# Patient Record
Sex: Female | Born: 1960
Health system: Southern US, Community
[De-identification: ages and names within clinical notes are randomized; demographics above are authoritative.]

## PROBLEM LIST (undated history)

## (undated) DIAGNOSIS — K219 Gastro-esophageal reflux disease without esophagitis: Secondary | ICD-10-CM

## (undated) DIAGNOSIS — J189 Pneumonia, unspecified organism: Secondary | ICD-10-CM

## (undated) DIAGNOSIS — M1990 Unspecified osteoarthritis, unspecified site: Secondary | ICD-10-CM

## (undated) DIAGNOSIS — Z9889 Other specified postprocedural states: Secondary | ICD-10-CM

## (undated) DIAGNOSIS — E039 Hypothyroidism, unspecified: Secondary | ICD-10-CM

## (undated) DIAGNOSIS — F32A Depression, unspecified: Secondary | ICD-10-CM

## (undated) DIAGNOSIS — R112 Nausea with vomiting, unspecified: Secondary | ICD-10-CM

## (undated) DIAGNOSIS — F419 Anxiety disorder, unspecified: Secondary | ICD-10-CM

## (undated) DIAGNOSIS — M722 Plantar fascial fibromatosis: Secondary | ICD-10-CM

## (undated) DIAGNOSIS — R51 Headache: Secondary | ICD-10-CM

## (undated) DIAGNOSIS — Z860100 Personal history of colon polyps, unspecified: Secondary | ICD-10-CM

## (undated) DIAGNOSIS — J302 Other seasonal allergic rhinitis: Secondary | ICD-10-CM

## (undated) DIAGNOSIS — R002 Palpitations: Secondary | ICD-10-CM

## (undated) DIAGNOSIS — J45909 Unspecified asthma, uncomplicated: Secondary | ICD-10-CM

## (undated) DIAGNOSIS — E0590 Thyrotoxicosis, unspecified without thyrotoxic crisis or storm: Secondary | ICD-10-CM

## (undated) HISTORY — PX: CHOLECYSTECTOMY: SHX55

## (undated) HISTORY — PX: ABDOMINAL HYSTERECTOMY: SHX81

## (undated) HISTORY — PX: TONSILLECTOMY: SUR1361

## (undated) HISTORY — PX: PLANTAR FASCIECTOMY: SUR600

## (undated) HISTORY — PX: NASAL SEPTUM SURGERY: SHX37

## (undated) HISTORY — PX: MEDIAL PARTIAL KNEE REPLACEMENT: SHX5965

---

## 2000-07-08 ENCOUNTER — Ambulatory Visit (HOSPITAL_COMMUNITY): Admission: RE | Admit: 2000-07-08 | Discharge: 2000-07-08 | Payer: Self-pay | Admitting: Internal Medicine

## 2000-07-19 ENCOUNTER — Encounter: Payer: Self-pay | Admitting: Obstetrics and Gynecology

## 2000-07-19 ENCOUNTER — Ambulatory Visit (HOSPITAL_COMMUNITY): Admission: RE | Admit: 2000-07-19 | Discharge: 2000-07-19 | Payer: Self-pay | Admitting: Obstetrics and Gynecology

## 2001-01-10 ENCOUNTER — Encounter: Payer: Self-pay | Admitting: Obstetrics and Gynecology

## 2001-01-10 ENCOUNTER — Ambulatory Visit (HOSPITAL_COMMUNITY): Admission: RE | Admit: 2001-01-10 | Discharge: 2001-01-10 | Payer: Self-pay | Admitting: Obstetrics and Gynecology

## 2001-12-01 ENCOUNTER — Other Ambulatory Visit: Admission: RE | Admit: 2001-12-01 | Discharge: 2001-12-01 | Payer: Self-pay | Admitting: Dermatology

## 2002-01-23 ENCOUNTER — Encounter: Payer: Self-pay | Admitting: Obstetrics and Gynecology

## 2002-01-23 ENCOUNTER — Ambulatory Visit (HOSPITAL_COMMUNITY): Admission: RE | Admit: 2002-01-23 | Discharge: 2002-01-23 | Payer: Self-pay | Admitting: Obstetrics and Gynecology

## 2003-01-25 ENCOUNTER — Encounter: Admission: RE | Admit: 2003-01-25 | Discharge: 2003-01-25 | Payer: Self-pay | Admitting: Obstetrics and Gynecology

## 2003-01-25 ENCOUNTER — Ambulatory Visit (HOSPITAL_COMMUNITY): Admission: RE | Admit: 2003-01-25 | Discharge: 2003-01-25 | Payer: Self-pay | Admitting: Obstetrics and Gynecology

## 2003-02-08 ENCOUNTER — Ambulatory Visit (HOSPITAL_COMMUNITY): Admission: RE | Admit: 2003-02-08 | Discharge: 2003-02-08 | Payer: Self-pay | Admitting: Family Medicine

## 2003-02-08 ENCOUNTER — Ambulatory Visit (HOSPITAL_COMMUNITY): Admission: RE | Admit: 2003-02-08 | Discharge: 2003-02-08 | Payer: Self-pay | Admitting: Obstetrics and Gynecology

## 2003-04-09 ENCOUNTER — Encounter: Admission: RE | Admit: 2003-04-09 | Discharge: 2003-04-09 | Payer: Self-pay | Admitting: Obstetrics & Gynecology

## 2003-04-26 ENCOUNTER — Ambulatory Visit (HOSPITAL_COMMUNITY): Admission: RE | Admit: 2003-04-26 | Discharge: 2003-04-26 | Payer: Self-pay | Admitting: Family Medicine

## 2003-08-20 ENCOUNTER — Ambulatory Visit (HOSPITAL_COMMUNITY): Admission: RE | Admit: 2003-08-20 | Discharge: 2003-08-20 | Payer: Self-pay | Admitting: Otolaryngology

## 2004-01-26 ENCOUNTER — Ambulatory Visit (HOSPITAL_COMMUNITY): Admission: RE | Admit: 2004-01-26 | Discharge: 2004-01-26 | Payer: Self-pay | Admitting: Obstetrics and Gynecology

## 2005-01-26 ENCOUNTER — Ambulatory Visit (HOSPITAL_COMMUNITY): Admission: RE | Admit: 2005-01-26 | Discharge: 2005-01-26 | Payer: Self-pay | Admitting: Obstetrics and Gynecology

## 2005-03-07 ENCOUNTER — Ambulatory Visit (HOSPITAL_COMMUNITY): Admission: RE | Admit: 2005-03-07 | Discharge: 2005-03-07 | Payer: Self-pay | Admitting: Family Medicine

## 2005-03-08 ENCOUNTER — Ambulatory Visit (HOSPITAL_COMMUNITY): Admission: RE | Admit: 2005-03-08 | Discharge: 2005-03-08 | Payer: Self-pay | Admitting: Family Medicine

## 2005-07-30 ENCOUNTER — Ambulatory Visit (HOSPITAL_COMMUNITY): Admission: RE | Admit: 2005-07-30 | Discharge: 2005-07-30 | Payer: Self-pay | Admitting: Internal Medicine

## 2005-07-30 ENCOUNTER — Ambulatory Visit: Payer: Self-pay | Admitting: Internal Medicine

## 2005-07-30 ENCOUNTER — Encounter (INDEPENDENT_AMBULATORY_CARE_PROVIDER_SITE_OTHER): Payer: Self-pay | Admitting: Specialist

## 2005-09-17 ENCOUNTER — Ambulatory Visit (HOSPITAL_COMMUNITY): Admission: RE | Admit: 2005-09-17 | Discharge: 2005-09-17 | Payer: Self-pay | Admitting: Family Medicine

## 2006-01-28 ENCOUNTER — Ambulatory Visit (HOSPITAL_COMMUNITY): Admission: RE | Admit: 2006-01-28 | Discharge: 2006-01-28 | Payer: Self-pay | Admitting: Family Medicine

## 2006-11-15 ENCOUNTER — Ambulatory Visit (HOSPITAL_COMMUNITY): Admission: RE | Admit: 2006-11-15 | Discharge: 2006-11-15 | Payer: Self-pay | Admitting: Obstetrics and Gynecology

## 2007-09-12 ENCOUNTER — Ambulatory Visit (HOSPITAL_COMMUNITY): Admission: RE | Admit: 2007-09-12 | Discharge: 2007-09-12 | Payer: Self-pay | Admitting: Family Medicine

## 2007-11-07 ENCOUNTER — Ambulatory Visit (HOSPITAL_COMMUNITY): Admission: RE | Admit: 2007-11-07 | Discharge: 2007-11-07 | Payer: Self-pay | Admitting: Obstetrics and Gynecology

## 2008-11-05 ENCOUNTER — Ambulatory Visit (HOSPITAL_COMMUNITY): Admission: RE | Admit: 2008-11-05 | Discharge: 2008-11-05 | Payer: Self-pay | Admitting: Family Medicine

## 2008-11-12 ENCOUNTER — Ambulatory Visit (HOSPITAL_COMMUNITY): Admission: RE | Admit: 2008-11-12 | Discharge: 2008-11-12 | Payer: Self-pay | Admitting: Family Medicine

## 2009-10-14 ENCOUNTER — Encounter: Admission: RE | Admit: 2009-10-14 | Discharge: 2009-10-14 | Payer: Self-pay | Admitting: Obstetrics & Gynecology

## 2009-11-18 ENCOUNTER — Ambulatory Visit (HOSPITAL_COMMUNITY): Admission: RE | Admit: 2009-11-18 | Discharge: 2009-11-18 | Payer: Self-pay | Admitting: Obstetrics and Gynecology

## 2010-02-19 ENCOUNTER — Encounter: Payer: Self-pay | Admitting: Family Medicine

## 2010-02-28 ENCOUNTER — Other Ambulatory Visit (HOSPITAL_COMMUNITY): Payer: Self-pay | Admitting: Endocrinology

## 2010-02-28 DIAGNOSIS — E049 Nontoxic goiter, unspecified: Secondary | ICD-10-CM

## 2010-05-05 ENCOUNTER — Ambulatory Visit (HOSPITAL_COMMUNITY): Payer: BC Managed Care – PPO

## 2010-05-05 ENCOUNTER — Other Ambulatory Visit (HOSPITAL_COMMUNITY): Payer: Self-pay

## 2010-05-12 ENCOUNTER — Ambulatory Visit (HOSPITAL_COMMUNITY)
Admission: RE | Admit: 2010-05-12 | Discharge: 2010-05-12 | Disposition: A | Discharge status: Home / Self Care | Payer: BC Managed Care – PPO | Source: Ambulatory Visit | Attending: Endocrinology | Admitting: Endocrinology

## 2010-05-12 DIAGNOSIS — E049 Nontoxic goiter, unspecified: Secondary | ICD-10-CM | POA: Insufficient documentation

## 2010-06-16 NOTE — Op Note (Signed)
NAMEARNIE, Herrera               ACCOUNT NO.:  [id number]   MEDICAL RECORD NO.:  [id number]          PATIENT TYPE:  AMB   LOCATION:  DAY                           FACILITY:  [facility]   PHYSICIAN:  Lionel December, M.D.    DATE OF BIRTH:  [birth year]   DATE OF PROCEDURE:  07/30/2005  DATE OF DISCHARGE:                                 OPERATIVE REPORT   PROCEDURE:  Colonoscopy.   INDICATIONS:  Joyce Herrera is a 50 year old Caucasian female who is here for high-  risk screening colonoscopy.  Family history is positive for colon carcinoma  in her father.  Joyce Herrera's last colonoscopy was 5 years ago.  Procedure and  risks were reviewed with the patient and informed consent was obtained.   MEDICATIONS FOR CONSCIOUS SEDATION:  Fentanyl 50 mcg IV, Versed 8 mg IV.   FINDINGS:  Procedure performed in endoscopy suite.  The patient's vital  signs and O2 sat were monitored during procedure and remained stable.  The  patient was placed in left lateral recumbent position and rectal examination  performed.  No abnormality noted on external or digital exam.  Olympus  videoscope was placed in the rectum and advanced under vision into sigmoid  colon and beyond.  Preparation was satisfactory.  A few small diverticula  were noted at sigmoid colon.  Scope was passed in cecum which was identified  by ileocecal valve and appendiceal orifice.  Pictures taken for the record.  There was a small polyp involving the blind-end cecum which was ablated via  cold biopsy.  Mucosa was carefully examined on the way out and there was  another polyp at proximal sigmoid colon which was ablated via cold biopsy.  Mucosa of the rest of the colon was normal.  Rectal mucosa similarly was  normal.  Scope was retroflexed to examine anorectal junction and small  hemorrhoids noted below the dentate line.  Endoscope was straightened and  withdrawn.  The patient tolerated the procedure well.   FINAL DIAGNOSES:  1.  Two small polyps ablated via  cold biopsy, one from cecum, other one from      sigmoid colon.  2.  Few scattered diverticula at sigmoid colon.  3.  External hemorrhoids.   RECOMMENDATIONS:  High-fiber diet.  I will be contacting the patient with  results of biopsy and further recommendations.      Lionel December, M.D.  Electronically Signed     NR/MEDQ  D:  07/30/2005  T:  07/30/2005  Job:  [id number]   cc:   Patrica Duel, M.D.  Fax: [phone]

## 2010-07-11 ENCOUNTER — Emergency Department (HOSPITAL_COMMUNITY): Payer: BC Managed Care – PPO

## 2010-07-11 ENCOUNTER — Emergency Department (HOSPITAL_COMMUNITY)
Admission: EM | Admit: 2010-07-11 | Discharge: 2010-07-11 | Disposition: A | Discharge status: Home / Self Care | Payer: BC Managed Care – PPO | Attending: Emergency Medicine | Admitting: Emergency Medicine

## 2010-07-11 DIAGNOSIS — Y9229 Other specified public building as the place of occurrence of the external cause: Secondary | ICD-10-CM | POA: Insufficient documentation

## 2010-07-11 DIAGNOSIS — M25539 Pain in unspecified wrist: Secondary | ICD-10-CM | POA: Insufficient documentation

## 2010-07-11 DIAGNOSIS — S63509A Unspecified sprain of unspecified wrist, initial encounter: Secondary | ICD-10-CM | POA: Insufficient documentation

## 2010-07-11 DIAGNOSIS — W010XXA Fall on same level from slipping, tripping and stumbling without subsequent striking against object, initial encounter: Secondary | ICD-10-CM | POA: Insufficient documentation

## 2010-07-11 DIAGNOSIS — S7000XA Contusion of unspecified hip, initial encounter: Secondary | ICD-10-CM | POA: Insufficient documentation

## 2010-07-11 DIAGNOSIS — M25559 Pain in unspecified hip: Secondary | ICD-10-CM | POA: Insufficient documentation

## 2010-08-18 ENCOUNTER — Other Ambulatory Visit (HOSPITAL_COMMUNITY): Payer: Self-pay | Admitting: Family Medicine

## 2010-08-18 DIAGNOSIS — Z139 Encounter for screening, unspecified: Secondary | ICD-10-CM

## 2010-08-18 DIAGNOSIS — G589 Mononeuropathy, unspecified: Secondary | ICD-10-CM

## 2010-08-25 ENCOUNTER — Ambulatory Visit (HOSPITAL_COMMUNITY)
Admission: RE | Admit: 2010-08-25 | Discharge: 2010-08-25 | Disposition: A | Discharge status: Home / Self Care | Payer: BC Managed Care – PPO | Source: Ambulatory Visit | Attending: Family Medicine | Admitting: Family Medicine

## 2010-08-25 DIAGNOSIS — Z780 Asymptomatic menopausal state: Secondary | ICD-10-CM | POA: Insufficient documentation

## 2010-08-25 DIAGNOSIS — Z13820 Encounter for screening for osteoporosis: Secondary | ICD-10-CM | POA: Insufficient documentation

## 2010-08-25 DIAGNOSIS — G589 Mononeuropathy, unspecified: Secondary | ICD-10-CM

## 2010-08-25 DIAGNOSIS — Z139 Encounter for screening, unspecified: Secondary | ICD-10-CM

## 2010-09-14 ENCOUNTER — Encounter (INDEPENDENT_AMBULATORY_CARE_PROVIDER_SITE_OTHER): Payer: BC Managed Care – PPO | Admitting: Internal Medicine

## 2010-09-14 ENCOUNTER — Ambulatory Visit (HOSPITAL_COMMUNITY)
Admission: RE | Admit: 2010-09-14 | Discharge: 2010-09-14 | Disposition: A | Discharge status: Home / Self Care | Payer: BC Managed Care – PPO | Source: Ambulatory Visit | Attending: Internal Medicine | Admitting: Internal Medicine

## 2010-09-14 ENCOUNTER — Encounter (HOSPITAL_COMMUNITY): Payer: Self-pay | Admitting: *Deleted

## 2010-09-14 ENCOUNTER — Encounter (HOSPITAL_COMMUNITY)
Admission: RE | Disposition: A | Discharge status: Home / Self Care | Payer: Self-pay | Source: Ambulatory Visit | Attending: Internal Medicine

## 2010-09-14 DIAGNOSIS — Z7982 Long term (current) use of aspirin: Secondary | ICD-10-CM | POA: Insufficient documentation

## 2010-09-14 DIAGNOSIS — K5730 Diverticulosis of large intestine without perforation or abscess without bleeding: Secondary | ICD-10-CM

## 2010-09-14 DIAGNOSIS — Z800 Family history of malignant neoplasm of digestive organs: Secondary | ICD-10-CM

## 2010-09-14 DIAGNOSIS — Z860100 Personal history of colon polyps, unspecified: Secondary | ICD-10-CM | POA: Insufficient documentation

## 2010-09-14 DIAGNOSIS — K644 Residual hemorrhoidal skin tags: Secondary | ICD-10-CM | POA: Insufficient documentation

## 2010-09-14 HISTORY — PX: COLONOSCOPY: SHX5424

## 2010-09-14 HISTORY — DX: Headache: R51

## 2010-09-14 HISTORY — DX: Nausea with vomiting, unspecified: Z98.890

## 2010-09-14 HISTORY — DX: Other seasonal allergic rhinitis: J30.2

## 2010-09-14 HISTORY — DX: Hypothyroidism, unspecified: E03.9

## 2010-09-14 HISTORY — DX: Personal history of colonic polyps: Z86.010

## 2010-09-14 HISTORY — DX: Personal history of colon polyps, unspecified: Z86.0100

## 2010-09-14 HISTORY — DX: Nausea with vomiting, unspecified: R11.2

## 2010-09-14 SURGERY — COLONOSCOPY
Anesthesia: Moderate Sedation

## 2010-09-14 MED ORDER — SODIUM CHLORIDE 0.45 % IV SOLN
Freq: Once | INTRAVENOUS | Status: AC
  Administered 2010-09-14: 50 mL/h via INTRAVENOUS

## 2010-09-14 MED ORDER — MEPERIDINE HCL 50 MG/ML IJ SOLN
INTRAMUSCULAR | Status: AC
  Filled 2010-09-14: qty 1

## 2010-09-14 MED ORDER — MIDAZOLAM HCL 5 MG/5ML IJ SOLN
INTRAMUSCULAR | Status: DC | PRN
  Administered 2010-09-14 (×5): 2 mg via INTRAVENOUS

## 2010-09-14 MED ORDER — STERILE WATER FOR IRRIGATION IR SOLN
Status: DC | PRN
  Administered 2010-09-14: 14:00:00

## 2010-09-14 MED ORDER — MEPERIDINE HCL 50 MG/ML IJ SOLN
INTRAMUSCULAR | Status: DC | PRN
  Administered 2010-09-14 (×2): 25 mg via INTRAVENOUS

## 2010-09-14 MED ORDER — ONDANSETRON HCL 4 MG/2ML IJ SOLN
INTRAMUSCULAR | Status: DC | PRN
  Administered 2010-09-14: 4 mg via INTRAVENOUS

## 2010-09-14 MED ORDER — ONDANSETRON HCL 4 MG/2ML IJ SOLN
INTRAMUSCULAR | Status: AC
  Filled 2010-09-14: qty 2

## 2010-09-14 MED ORDER — MIDAZOLAM HCL 5 MG/5ML IJ SOLN
INTRAMUSCULAR | Status: AC
  Filled 2010-09-14: qty 10

## 2010-09-14 NOTE — H&P (Signed)
Joyce Herrera is an 50 y.o. female.   Chief Complaint::for colonoscopy. HPI: Patient is 50 year old [place] female who is here for surveillance colonoscopy ; she has history of colonic adenomas. Last exam was in July 2007. She denies abdominal pain rectal bleeding or change in bowel habits. She has a good appetite and her weight is stable. Family history is positive for colon carcinoma in the father who had disease in his early 36,s and remains in remission.  Past Medical History  Diagnosis Date  . Hypothyroidism   . Seasonal allergies   . History of colon polyps   . Headache   . PONV (postoperative nausea and vomiting)     Past Surgical History  Procedure Date  . Cholecystectomy   . Abdominal hysterectomy   . Tonsillectomy   . Nasal septum surgery   . Plantar fasciectomy     bilateral    No family history on file. Social History:  reports that she has never smoked. She does not have any smokeless tobacco history on file. She reports that she does not drink alcohol or use illicit drugs.  Allergies:  Allergies  Allergen Reactions  . Penicillins Rash    Medications Prior to Admission  Medication Dose Route Frequency Provider Last Rate Last Dose  . meperidine (DEMEROL) 50 MG/ML injection           . midazolam (VERSED) 5 MG/5ML injection            Medications Prior to Admission  Medication Sig Dispense Refill  . aspirin 81 MG tablet Take 81 mg by mouth daily.        . calcium carbonate (OS-CAL) 600 MG TABS Take 600 mg by mouth 2 (two) times daily with a meal.        . cetirizine (ZYRTEC) 10 MG tablet Take 10 mg by mouth daily as needed.        Marland Kitchen estradiol (ESTRACE) 1 MG tablet Take 1 mg by mouth daily.        Marland Kitchen ibuprofen (ADVIL,MOTRIN) 200 MG tablet Take 200 mg by mouth every 6 (six) hours as needed.        Marland Kitchen levothyroxine (SYNTHROID, LEVOTHROID) 25 MCG tablet Take 25 mcg by mouth daily.        Marland Kitchen loratadine (CLARITIN) 10 MG tablet Take 10 mg by mouth daily as needed.         . pregabalin (LYRICA) 300 MG capsule Take 300 mg by mouth daily.          No results found for this or any previous visit (from the past 48 hour(s)). No results found.  Review of Systems  Constitutional: Negative for weight loss.  Gastrointestinal: Negative for abdominal pain, diarrhea, constipation, blood in stool and melena.    Blood pressure 108/77, pulse 95, temperature 98.4 F (36.9 C), temperature source Oral, resp. rate 21, height 5\' 5"  (1.651 m), weight 155 lb (70.308 kg), SpO2 97.00%. Physical Exam  Constitutional: She appears well-developed and well-nourished.  HENT:  Mouth/Throat: Oropharynx is clear and moist.  Eyes: Conjunctivae are normal. No scleral icterus.  Neck: No thyromegaly present.  Cardiovascular: Normal rate and regular rhythm.   No murmur heard. Respiratory: Breath sounds normal.  GI: Soft. She exhibits no distension and no mass. There is no tenderness.  Musculoskeletal: She exhibits no edema.  Lymphadenopathy:    She has no cervical adenopathy.  Neurological: She is alert.  Skin: Skin is warm.     Assessment/Plan History of colonic  polyps and family history of colon carcinoma. Patient is here for surveillance colonoscopy  Gaines Cartmell U 09/14/2010, 2:09 PM

## 2010-09-14 NOTE — Op Note (Signed)
COLONOSCOPY PROCEDURE REPORT  PATIENT:  Joyce Herrera  MR#:  [id number] Birthdate:  05/31/1960, 50 y.o., female Endoscopist:  Dr. Malissa Hippo, MD Referred By:  Dr. Kirk Ruths MD Procedure Date: 09/14/2010  Procedure:   Colonoscopy  Indications:  History of colonic polyps and family history of colon carcinoma in her father.  Informed Consent:  Seizure and risks were reviewed with the patient and informed consent obtained.  Medications:  Demerol 50 mg IV Versed 10 mg IV  Description of procedure:  After a digital rectal exam was performed, that colonoscope was advanced from the anus through the rectum and colon to the area of the cecum, ileocecal valve and appendiceal orifice. The cecum was deeply intubated. These structures were well-seen and photographed for the record. From the level of the cecum and ileocecal valve, the scope was slowly and cautiously withdrawn. The mucosal surfaces were carefully surveyed utilizing scope tip to flexion to facilitate fold flattening as needed. The scope was pulled down into the rectum where a thorough exam including retroflexion was performed.  Findings:   Prep fair in transverse and ascending colon. Few scattered diverticula at sigmoid colon. Small external hemorrhoids. No evidence of recurrent polyps.  Therapeutic/Diagnostic Maneuvers Performed:  None  Complications:  None  Cecal Withdrawal Time:  8 minutes  Impression:  Few diverticula at sigmoid colon and external hemorrhoids. No evidence of recurrent polyps; however examination of proximal half of the colon somewhat compromised because of quality of prep.  Recommendations:  Resume usual medications. High fiber diet. Next exam in 5 years.  REHMAN,NAJEEB U  09/14/2010 2:57 PM  CC: Dr. Kirk Ruths, MD

## 2010-09-21 ENCOUNTER — Encounter (HOSPITAL_COMMUNITY): Payer: Self-pay | Admitting: Internal Medicine

## 2010-10-30 ENCOUNTER — Other Ambulatory Visit: Payer: Self-pay | Admitting: Adult Health

## 2010-10-30 DIAGNOSIS — Z139 Encounter for screening, unspecified: Secondary | ICD-10-CM

## 2010-11-24 ENCOUNTER — Ambulatory Visit (HOSPITAL_COMMUNITY)
Admission: RE | Admit: 2010-11-24 | Discharge: 2010-11-24 | Disposition: A | Discharge status: Home / Self Care | Payer: BC Managed Care – PPO | Source: Ambulatory Visit | Attending: Adult Health | Admitting: Adult Health

## 2010-11-24 DIAGNOSIS — Z1231 Encounter for screening mammogram for malignant neoplasm of breast: Secondary | ICD-10-CM | POA: Insufficient documentation

## 2010-11-24 DIAGNOSIS — Z139 Encounter for screening, unspecified: Secondary | ICD-10-CM

## 2011-01-12 ENCOUNTER — Other Ambulatory Visit (HOSPITAL_COMMUNITY): Payer: Self-pay | Admitting: Family Medicine

## 2011-01-12 DIAGNOSIS — E039 Hypothyroidism, unspecified: Secondary | ICD-10-CM

## 2011-01-19 ENCOUNTER — Ambulatory Visit (HOSPITAL_COMMUNITY)
Admission: RE | Admit: 2011-01-19 | Discharge: 2011-01-19 | Disposition: A | Discharge status: Home / Self Care | Payer: BC Managed Care – PPO | Source: Ambulatory Visit | Attending: Family Medicine | Admitting: Family Medicine

## 2011-01-19 DIAGNOSIS — R1310 Dysphagia, unspecified: Secondary | ICD-10-CM | POA: Insufficient documentation

## 2011-01-19 DIAGNOSIS — E039 Hypothyroidism, unspecified: Secondary | ICD-10-CM | POA: Insufficient documentation

## 2011-02-08 ENCOUNTER — Other Ambulatory Visit (HOSPITAL_COMMUNITY): Payer: Self-pay | Admitting: "Endocrinology

## 2011-02-08 DIAGNOSIS — E0590 Thyrotoxicosis, unspecified without thyrotoxic crisis or storm: Secondary | ICD-10-CM

## 2011-02-22 ENCOUNTER — Encounter (HOSPITAL_COMMUNITY)
Admission: RE | Admit: 2011-02-22 | Discharge: 2011-02-22 | Disposition: A | Discharge status: Home / Self Care | Payer: BC Managed Care – PPO | Source: Ambulatory Visit | Attending: "Endocrinology | Admitting: "Endocrinology

## 2011-02-22 DIAGNOSIS — E0590 Thyrotoxicosis, unspecified without thyrotoxic crisis or storm: Secondary | ICD-10-CM

## 2011-02-23 ENCOUNTER — Encounter (HOSPITAL_COMMUNITY): Payer: BC Managed Care – PPO

## 2011-02-26 ENCOUNTER — Encounter (HOSPITAL_COMMUNITY): Payer: BC Managed Care – PPO

## 2011-02-27 ENCOUNTER — Encounter (HOSPITAL_COMMUNITY): Payer: BC Managed Care – PPO

## 2011-02-28 ENCOUNTER — Encounter (HOSPITAL_COMMUNITY)
Admission: RE | Admit: 2011-02-28 | Discharge: 2011-02-28 | Disposition: A | Discharge status: Home / Self Care | Payer: BC Managed Care – PPO | Source: Ambulatory Visit | Attending: "Endocrinology | Admitting: "Endocrinology

## 2011-02-28 ENCOUNTER — Encounter (HOSPITAL_COMMUNITY): Payer: Self-pay

## 2011-02-28 DIAGNOSIS — E0590 Thyrotoxicosis, unspecified without thyrotoxic crisis or storm: Secondary | ICD-10-CM | POA: Insufficient documentation

## 2011-02-28 DIAGNOSIS — R259 Unspecified abnormal involuntary movements: Secondary | ICD-10-CM | POA: Insufficient documentation

## 2011-02-28 MED ORDER — SODIUM IODIDE I 131 CAPSULE
10.0000 | Freq: Once | INTRAVENOUS | Status: AC | PRN
  Administered 2011-02-28: 9 via ORAL

## 2011-03-01 ENCOUNTER — Ambulatory Visit (HOSPITAL_COMMUNITY): Payer: BC Managed Care – PPO

## 2011-03-01 ENCOUNTER — Other Ambulatory Visit (HOSPITAL_COMMUNITY): Payer: BC Managed Care – PPO

## 2011-03-01 ENCOUNTER — Encounter (HOSPITAL_COMMUNITY)
Admission: RE | Admit: 2011-03-01 | Discharge: 2011-03-01 | Disposition: A | Discharge status: Home / Self Care | Payer: BC Managed Care – PPO | Source: Ambulatory Visit | Attending: "Endocrinology | Admitting: "Endocrinology

## 2011-03-01 MED ORDER — SODIUM PERTECHNETATE TC 99M INJECTION
10.0000 | Freq: Once | INTRAVENOUS | Status: AC | PRN
  Administered 2011-03-01: 10 via INTRAVENOUS

## 2011-03-02 ENCOUNTER — Other Ambulatory Visit (HOSPITAL_COMMUNITY): Payer: BC Managed Care – PPO

## 2011-10-15 ENCOUNTER — Other Ambulatory Visit: Payer: Self-pay | Admitting: Obstetrics and Gynecology

## 2011-10-15 DIAGNOSIS — Z139 Encounter for screening, unspecified: Secondary | ICD-10-CM

## 2011-10-26 ENCOUNTER — Ambulatory Visit (HOSPITAL_COMMUNITY)
Admission: RE | Admit: 2011-10-26 | Discharge: 2011-10-26 | Disposition: A | Discharge status: Home / Self Care | Payer: BC Managed Care – PPO | Source: Ambulatory Visit | Attending: Obstetrics and Gynecology | Admitting: Obstetrics and Gynecology

## 2011-10-26 DIAGNOSIS — Z139 Encounter for screening, unspecified: Secondary | ICD-10-CM

## 2011-11-27 ENCOUNTER — Ambulatory Visit (HOSPITAL_COMMUNITY)
Admission: RE | Admit: 2011-11-27 | Discharge: 2011-11-27 | Disposition: A | Discharge status: Home / Self Care | Payer: BC Managed Care – PPO | Source: Ambulatory Visit | Attending: Obstetrics and Gynecology | Admitting: Obstetrics and Gynecology

## 2011-11-27 DIAGNOSIS — Z1231 Encounter for screening mammogram for malignant neoplasm of breast: Secondary | ICD-10-CM | POA: Insufficient documentation

## 2012-04-29 ENCOUNTER — Other Ambulatory Visit (HOSPITAL_COMMUNITY): Payer: Self-pay | Admitting: "Endocrinology

## 2012-04-29 DIAGNOSIS — R7989 Other specified abnormal findings of blood chemistry: Secondary | ICD-10-CM

## 2012-05-06 ENCOUNTER — Ambulatory Visit (HOSPITAL_COMMUNITY)
Admission: RE | Admit: 2012-05-06 | Discharge: 2012-05-06 | Disposition: A | Discharge status: Home / Self Care | Payer: BC Managed Care – PPO | Source: Ambulatory Visit | Attending: Family Medicine | Admitting: Family Medicine

## 2012-05-06 ENCOUNTER — Other Ambulatory Visit (HOSPITAL_COMMUNITY): Payer: Self-pay | Admitting: Family Medicine

## 2012-05-06 DIAGNOSIS — M25559 Pain in unspecified hip: Secondary | ICD-10-CM | POA: Insufficient documentation

## 2012-05-06 DIAGNOSIS — M169 Osteoarthritis of hip, unspecified: Secondary | ICD-10-CM

## 2012-05-07 ENCOUNTER — Encounter (HOSPITAL_COMMUNITY): Payer: BC Managed Care – PPO

## 2012-05-08 ENCOUNTER — Encounter (HOSPITAL_COMMUNITY): Payer: BC Managed Care – PPO

## 2012-05-08 ENCOUNTER — Encounter (HOSPITAL_COMMUNITY): Admission: RE | Admit: 2012-05-08 | Payer: BC Managed Care – PPO | Source: Ambulatory Visit

## 2012-05-09 ENCOUNTER — Encounter (HOSPITAL_COMMUNITY): Payer: BC Managed Care – PPO

## 2012-05-15 ENCOUNTER — Encounter (HOSPITAL_COMMUNITY)
Admission: RE | Admit: 2012-05-15 | Discharge: 2012-05-15 | Disposition: A | Discharge status: Home / Self Care | Payer: BC Managed Care – PPO | Source: Ambulatory Visit | Attending: "Endocrinology | Admitting: "Endocrinology

## 2012-05-15 ENCOUNTER — Encounter (HOSPITAL_COMMUNITY): Payer: Self-pay

## 2012-05-15 DIAGNOSIS — R6889 Other general symptoms and signs: Secondary | ICD-10-CM | POA: Insufficient documentation

## 2012-05-15 DIAGNOSIS — R7989 Other specified abnormal findings of blood chemistry: Secondary | ICD-10-CM

## 2012-05-15 MED ORDER — SODIUM IODIDE I 131 CAPSULE
8.0000 | Freq: Once | INTRAVENOUS | Status: AC | PRN
  Administered 2012-05-15: 8 via ORAL

## 2012-05-16 ENCOUNTER — Encounter (HOSPITAL_COMMUNITY)
Admission: RE | Admit: 2012-05-16 | Discharge: 2012-05-16 | Disposition: A | Discharge status: Home / Self Care | Payer: BC Managed Care – PPO | Source: Ambulatory Visit | Attending: "Endocrinology | Admitting: "Endocrinology

## 2012-05-16 ENCOUNTER — Encounter (HOSPITAL_COMMUNITY): Payer: Self-pay

## 2012-05-16 MED ORDER — SODIUM PERTECHNETATE TC 99M INJECTION
10.0000 | Freq: Once | INTRAVENOUS | Status: AC | PRN
  Administered 2012-05-16: 10 via INTRAVENOUS

## 2012-12-01 ENCOUNTER — Other Ambulatory Visit (HOSPITAL_COMMUNITY): Payer: Self-pay | Admitting: Family Medicine

## 2012-12-01 DIAGNOSIS — Z139 Encounter for screening, unspecified: Secondary | ICD-10-CM

## 2013-01-09 ENCOUNTER — Ambulatory Visit (INDEPENDENT_AMBULATORY_CARE_PROVIDER_SITE_OTHER): Payer: BC Managed Care – PPO

## 2013-01-09 VITALS — BP 113/77 | HR 99 | Resp 18

## 2013-01-09 DIAGNOSIS — G5760 Lesion of plantar nerve, unspecified lower limb: Secondary | ICD-10-CM

## 2013-01-09 DIAGNOSIS — M7750 Other enthesopathy of unspecified foot: Secondary | ICD-10-CM

## 2013-01-09 DIAGNOSIS — G629 Polyneuropathy, unspecified: Secondary | ICD-10-CM

## 2013-01-09 DIAGNOSIS — G609 Hereditary and idiopathic neuropathy, unspecified: Secondary | ICD-10-CM

## 2013-01-09 DIAGNOSIS — M778 Other enthesopathies, not elsewhere classified: Secondary | ICD-10-CM

## 2013-01-09 DIAGNOSIS — M722 Plantar fascial fibromatosis: Secondary | ICD-10-CM

## 2013-01-09 MED ORDER — TRIAMCINOLONE ACETONIDE 10 MG/ML IJ SUSP: 10.0000 mg | Freq: Once | INTRAMUSCULAR | Status: DC

## 2013-01-09 MED ORDER — PREGABALIN 300 MG PO CAPS: 300.0000 mg | ORAL_CAPSULE | Freq: Every day | ORAL | Status: DC

## 2013-01-09 NOTE — Progress Notes (Signed)
° °  Subjective:    Patient ID: Joyce Herrera, female    DOB: 09/06/1960, 52 y.o.   MRN: [id number]  HPI my arches are hurting and am on my feet all day long and I am still on the lyrica and it doesn't seem to be helping     Review of Systems  Constitutional: Positive for fatigue and unexpected weight change.  HENT:       Cold   Eyes:       Eyes are dry   Respiratory: Negative.   Cardiovascular: Negative.   Gastrointestinal: Negative.   Endocrine: Negative.   Genitourinary: Negative.   Musculoskeletal: Negative.   Skin: Negative.   Allergic/Immunologic: Positive for environmental allergies.       Dust mites  Neurological:       Off balance  Hematological: Bruises/bleeds easily.  Psychiatric/Behavioral: Negative.        Objective:   Physical Exam Neurovascular status is intact pedal pulses palpable epicritic and proprioceptive sensations intact patient has hyperesthesia on direct lateral compression of the forefoot however more significant pain mid arch mid and plantar fascia and medial calcaneal tubercle bilateral heels. Patient wearing orthotics with arch pads the seem to fit and contour well. Patient still working long shifts in her feet but in the feet painful and symptomatic patient also some proptosis for hallux nails they became for future partial nail excision if it becomes painful or symptomatic are worsened suggest avoid nail polish in the future. On palpation medial check into the painful tender and symptomatic patient request a steroid injection at this time or more aggressive options has injection more than a year ago with good success at this time is also been taking gabapentin or correction Lyrica each bedtime having some improvement although not complete resolution of pain. Will continue with Lyrica daily       Assessment & Plan:  Assessment plantar fasciitis/heel spur syndrome as well as possible secondary capsulitis and Morton's neuroma symptomology and  alternate her to history gait changes. Patient has a cavus type foot. Orthotics previously Jill Alexanders will continue to be used also maintain appropriate Oxford type shoe. At this time injection tender with Kenalog 20 mg Xylocaine plain infiltrated to the inferior heel area bilateral. We authorize prescription for Lyrica 3 mg q. at bedtime followup appointment within 2 months for an as-needed basis if symptoms recur are exacerbated.  Alvan Dame DPM

## 2013-01-09 NOTE — Patient Instructions (Signed)

## 2013-01-26 ENCOUNTER — Ambulatory Visit (HOSPITAL_COMMUNITY): Payer: BC Managed Care – PPO

## 2013-01-27 ENCOUNTER — Ambulatory Visit (HOSPITAL_COMMUNITY)
Admission: RE | Admit: 2013-01-27 | Discharge: 2013-01-27 | Disposition: A | Discharge status: Home / Self Care | Payer: BC Managed Care – PPO | Source: Ambulatory Visit | Attending: Family Medicine | Admitting: Family Medicine

## 2013-01-27 DIAGNOSIS — Z1231 Encounter for screening mammogram for malignant neoplasm of breast: Secondary | ICD-10-CM | POA: Insufficient documentation

## 2013-01-27 DIAGNOSIS — Z139 Encounter for screening, unspecified: Secondary | ICD-10-CM

## 2013-03-13 ENCOUNTER — Ambulatory Visit (INDEPENDENT_AMBULATORY_CARE_PROVIDER_SITE_OTHER): Payer: BC Managed Care – PPO

## 2013-03-13 VITALS — BP 112/64 | HR 84 | Resp 12

## 2013-03-13 DIAGNOSIS — G629 Polyneuropathy, unspecified: Secondary | ICD-10-CM

## 2013-03-13 DIAGNOSIS — G609 Hereditary and idiopathic neuropathy, unspecified: Secondary | ICD-10-CM

## 2013-03-13 DIAGNOSIS — M722 Plantar fascial fibromatosis: Secondary | ICD-10-CM

## 2013-03-13 NOTE — Patient Instructions (Signed)
ICE INSTRUCTIONS  Apply ice or cold pack to the affected area at least 3 times a day for 10-15 minutes each time.  You should also use ice after prolonged activity or vigorous exercise.  Do not apply ice longer than 20 minutes at one time.  Always keep a cloth between your skin and the ice pack to prevent burns.  Being consistent and following these instructions will help control your symptoms.  We suggest you purchase a gel ice pack because they are reusable and do bit leak.  Some of them are designed to wrap around the area.  Use the method that works best for you.  Here are some other suggestions for icing.   Use a frozen bag of peas or corn-inexpensive and molds well to your body, usually stays frozen for 10 to 20 minutes.  Wet a towel with cold water and squeeze out the excess until it's damp.  Place in a bag in the freezer for 20 minutes. Then remove and use.  Recommend never go barefoot suggest using crocs around the house as alternative to [facility]

## 2013-03-13 NOTE — Progress Notes (Signed)
   Subjective:    Patient ID: Joyce Herrera, female    DOB: 1960-05-14, 53 y.o.   MRN: [id number]  HPI '' BOTH HEELS ARE  MUCH BETTER, BUT WORRY ABOUT BEING ON MY FEET ALL DAY THEY MUCH START HURTING AGAIN.''    Review of Systems no new changes or findings     Objective:   Physical Exam Vascular status is intact pedal pulses palpable in symptomology both paresthesias and the neuropathy symptoms and the pain heel has improved with the previous steroid injection continues to pain stable patient also wearing some Topaz and orthotics has very high arches will continue maintain orthoses at all times. Also read recommendations for a crocs or [place] around the house all times no barefoot no flimsy shoes or flip-flops.       Assessment & Plan:  Are assessment stable are improving plantar fasciitis/heel spur syndrome as well as peripheral neuropathy and neuralgia patient will continue with Lyrica as instructed with refills the future we'll authorized as needed at this time patient will continue with ice to the heel areas is recommended and maintaining a good orthoses at all times followup as needed for any flareups or exacerbations if she continues to have difficulty may be K. for more invasive or surgical options literature ID pad and EPF are dispensed  Harriet Masson DPM

## 2013-05-26 ENCOUNTER — Encounter (INDEPENDENT_AMBULATORY_CARE_PROVIDER_SITE_OTHER): Payer: Self-pay

## 2013-05-26 ENCOUNTER — Other Ambulatory Visit (HOSPITAL_COMMUNITY): Payer: Self-pay | Admitting: Physician Assistant

## 2013-05-26 ENCOUNTER — Ambulatory Visit (HOSPITAL_COMMUNITY)
Admission: RE | Admit: 2013-05-26 | Discharge: 2013-05-26 | Disposition: A | Discharge status: Home / Self Care | Payer: BC Managed Care – PPO | Source: Ambulatory Visit | Attending: Physician Assistant | Admitting: Physician Assistant

## 2013-05-26 DIAGNOSIS — Z0000 Encounter for general adult medical examination without abnormal findings: Secondary | ICD-10-CM

## 2013-05-26 DIAGNOSIS — R059 Cough, unspecified: Secondary | ICD-10-CM

## 2013-05-26 DIAGNOSIS — R05 Cough: Secondary | ICD-10-CM | POA: Insufficient documentation

## 2013-07-07 ENCOUNTER — Other Ambulatory Visit: Payer: Self-pay

## 2013-07-07 ENCOUNTER — Telehealth: Payer: Self-pay | Admitting: *Deleted

## 2013-07-07 NOTE — Telephone Encounter (Signed)
Okay to refill of her medication the reorder the pregabalin or Lyrica 300 mg one each bedtime. Also allow for 5 additional refills. I could not locate the pharmacy he may need to contact her and get appropriate or direct 3 for the pharmacy  Harriet Masson DPM

## 2013-07-07 NOTE — Telephone Encounter (Signed)
On my last couple of weeks of Lyrica 300mg .  Joyce Herrera, I need a refill.

## 2013-07-08 ENCOUNTER — Telehealth: Payer: Self-pay | Admitting: *Deleted

## 2013-07-08 MED ORDER — PREGABALIN 300 MG PO CAPS: 300.0000 mg | ORAL_CAPSULE | Freq: Every day | ORAL | Status: DC

## 2013-07-08 NOTE — Telephone Encounter (Signed)
I called and left the patient a message that she will have to come by the office to pick up the prescription.  It cannot be sent to the pharmacy.

## 2013-07-08 NOTE — Telephone Encounter (Signed)
I received a call from a [place].  I informed her that her prescription was ready to be picked up.  She asked if it could be put in the mail.  I told her no.  She asked if someone could pick it up for her.  I told her yes, as long as we know who is going to pick it up.  She said she'll get her daughter in-law, Joyce Herrera, to pick it up.  I told her that's fine.

## 2013-09-15 ENCOUNTER — Other Ambulatory Visit (HOSPITAL_COMMUNITY): Payer: Self-pay | Admitting: Family Medicine

## 2013-09-15 ENCOUNTER — Ambulatory Visit (HOSPITAL_COMMUNITY)
Admission: RE | Admit: 2013-09-15 | Discharge: 2013-09-15 | Disposition: A | Discharge status: Home / Self Care | Payer: BC Managed Care – PPO | Source: Ambulatory Visit | Attending: Family Medicine | Admitting: Family Medicine

## 2013-09-15 DIAGNOSIS — M25551 Pain in right hip: Secondary | ICD-10-CM

## 2013-09-15 DIAGNOSIS — M25559 Pain in unspecified hip: Secondary | ICD-10-CM | POA: Diagnosis present

## 2013-09-17 ENCOUNTER — Ambulatory Visit (HOSPITAL_COMMUNITY)
Admission: RE | Admit: 2013-09-17 | Discharge: 2013-09-17 | Disposition: A | Discharge status: Home / Self Care | Payer: BC Managed Care – PPO | Source: Ambulatory Visit | Attending: Family Medicine | Admitting: Family Medicine

## 2013-09-17 ENCOUNTER — Other Ambulatory Visit (HOSPITAL_COMMUNITY): Payer: Self-pay | Admitting: Family Medicine

## 2013-09-17 DIAGNOSIS — M25559 Pain in unspecified hip: Secondary | ICD-10-CM | POA: Diagnosis present

## 2013-09-17 DIAGNOSIS — C754 Malignant neoplasm of carotid body: Secondary | ICD-10-CM | POA: Diagnosis not present

## 2013-09-17 DIAGNOSIS — M25552 Pain in left hip: Secondary | ICD-10-CM

## 2013-11-06 ENCOUNTER — Other Ambulatory Visit (HOSPITAL_COMMUNITY): Payer: Self-pay | Admitting: Family Medicine

## 2013-11-06 DIAGNOSIS — Z139 Encounter for screening, unspecified: Secondary | ICD-10-CM

## 2013-11-10 ENCOUNTER — Ambulatory Visit (INDEPENDENT_AMBULATORY_CARE_PROVIDER_SITE_OTHER): Payer: BC Managed Care – PPO

## 2013-11-10 VITALS — Ht 65.0 in | Wt 162.0 lb

## 2013-11-10 DIAGNOSIS — M779 Enthesopathy, unspecified: Secondary | ICD-10-CM

## 2013-11-10 DIAGNOSIS — M7750 Other enthesopathy of unspecified foot: Secondary | ICD-10-CM

## 2013-11-10 DIAGNOSIS — M778 Other enthesopathies, not elsewhere classified: Secondary | ICD-10-CM

## 2013-11-10 DIAGNOSIS — G629 Polyneuropathy, unspecified: Secondary | ICD-10-CM

## 2013-11-10 DIAGNOSIS — M722 Plantar fascial fibromatosis: Secondary | ICD-10-CM

## 2013-11-10 MED ORDER — PREGABALIN 300 MG PO CAPS: 300.0000 mg | ORAL_CAPSULE | Freq: Two times a day (BID) | ORAL | Status: DC

## 2013-11-10 MED ORDER — TRIAMCINOLONE ACETONIDE 10 MG/ML IJ SUSP: 10.0000 mg | Freq: Once | INTRAMUSCULAR | Status: DC

## 2013-11-10 NOTE — Patient Instructions (Signed)

## 2013-11-10 NOTE — Progress Notes (Signed)
   Subjective:    Patient ID: Joyce Herrera, female    DOB: [birth year], 52 y.o.   MRN: [id number]  HPI Comments: Pt states she continues to have the pain in both plantar feet and arches.  Pt continues to wear the orthotics with the additional metatarsal pads on the arches, and states the Lyrica does not seem to help as much as before.      Review of Systems  Endocrine:       Thyroid problems.  All other systems reviewed and are negative.      Objective:   Physical Exam Neurovascular status is intact patient does have history of neuropathy indicates to gabapentin earlier has helped by afternoon starting to wear off maybe candidate for higher dosage at this time. At this time patient also continues have a re\re exacerbation of heel pain bilateral medial band of plantar fascia medial calcaneal tubercle bilateral painful and symptomatic the orthotics fit and contour well. Patient had shockwave therapy she's had previous surgery or EPF procedures had multiple injections of steroids NSAID therapies continues to have recalcitrant plantar fasciitis and neuritis of the heel at this time up to dose of Lyrica and recommendation for a booster shot per patient request my recommendation tender with Kenalog 20 mg Xylocaine plain infiltrated to the heels bilateral       Assessment & Plan:  Assessment recalcitrant plantar fasciitis/heel spur syndrome as well as peripheral neuropathy radiographic neuritis or neuralgia plan at this time a dose of Lyrica to twice a day for 3 mg also at this time injection time is to a 20 mg Xylocaine plain bilateral heels recheck in 2-3 weeks for 2-3 months for followup contact us visiting changes or difficulties in the interim. Maintain orthotics at all times also recommended ice pack to the heels.  Harriet Masson DPM

## 2013-11-13 ENCOUNTER — Ambulatory Visit (HOSPITAL_COMMUNITY)
Admission: RE | Admit: 2013-11-13 | Discharge: 2013-11-13 | Disposition: A | Discharge status: Home / Self Care | Payer: BC Managed Care – PPO | Source: Ambulatory Visit | Attending: Family Medicine | Admitting: Family Medicine

## 2013-11-13 DIAGNOSIS — Z139 Encounter for screening, unspecified: Secondary | ICD-10-CM

## 2013-11-13 DIAGNOSIS — Z1231 Encounter for screening mammogram for malignant neoplasm of breast: Secondary | ICD-10-CM | POA: Diagnosis present

## 2014-03-05 ENCOUNTER — Other Ambulatory Visit: Payer: Self-pay

## 2014-03-05 MED ORDER — PREGABALIN 300 MG PO CAPS: 300.0000 mg | ORAL_CAPSULE | Freq: Two times a day (BID) | ORAL | Status: DC

## 2014-04-14 ENCOUNTER — Encounter: Payer: Self-pay | Admitting: Podiatry

## 2014-04-14 ENCOUNTER — Ambulatory Visit (INDEPENDENT_AMBULATORY_CARE_PROVIDER_SITE_OTHER): Payer: BLUE CROSS/BLUE SHIELD | Admitting: Podiatry

## 2014-04-14 VITALS — BP 107/74 | HR 91 | Resp 16

## 2014-04-14 DIAGNOSIS — G629 Polyneuropathy, unspecified: Secondary | ICD-10-CM | POA: Diagnosis not present

## 2014-04-14 DIAGNOSIS — M722 Plantar fascial fibromatosis: Secondary | ICD-10-CM | POA: Diagnosis not present

## 2014-04-14 MED ORDER — TRIAMCINOLONE ACETONIDE 10 MG/ML IJ SUSP
10.0000 mg | Freq: Once | INTRAMUSCULAR | Status: AC
  Administered 2014-04-14: 10 mg

## 2014-04-14 MED ORDER — PREGABALIN 300 MG PO CAPS: 300.0000 mg | ORAL_CAPSULE | Freq: Two times a day (BID) | ORAL | Status: DC

## 2014-04-15 NOTE — Progress Notes (Signed)
Subjective:     Patient ID: Joyce Herrera, female   DOB: 1960/05/21, 54 y.o.   MRN: [id number]  HPI patient presents with heel pain bilateral states she's been doing well but it started to increase over the last month   Review of Systems     Objective:   Physical Exam Neurovascular status intact muscle strength adequate with discomfort in the plantar heel region left with fluid buildup around the medial band    Assessment:     Plantar fasciitis bilateral with inflammation and fluid buildup noted    Plan:     Injected the plantar fascia bilateral 3 mg Kenalog 5 mg Xylocaine and instructed on physical therapy and supportive shoe and reappoint as needed

## 2014-08-04 ENCOUNTER — Encounter: Payer: Self-pay | Admitting: Podiatry

## 2014-08-04 ENCOUNTER — Ambulatory Visit (INDEPENDENT_AMBULATORY_CARE_PROVIDER_SITE_OTHER): Payer: BLUE CROSS/BLUE SHIELD | Admitting: Podiatry

## 2014-08-04 VITALS — BP 121/55 | HR 97 | Resp 15

## 2014-08-04 DIAGNOSIS — M722 Plantar fascial fibromatosis: Secondary | ICD-10-CM

## 2014-08-04 DIAGNOSIS — L600 Ingrowing nail: Secondary | ICD-10-CM | POA: Diagnosis not present

## 2014-08-04 MED ORDER — TRIAMCINOLONE ACETONIDE 10 MG/ML IJ SUSP
10.0000 mg | Freq: Once | INTRAMUSCULAR | Status: AC
  Administered 2014-08-04: 10 mg

## 2014-08-04 MED ORDER — PREGABALIN 300 MG PO CAPS: 300.0000 mg | ORAL_CAPSULE | Freq: Two times a day (BID) | ORAL | Status: DC

## 2014-08-04 NOTE — Progress Notes (Signed)
Subjective:     Patient ID: Joyce Herrera, female   DOB: [birth year], 54 y.o.   MRN: [id number]  HPI patient states the heels of both my feet are really sore and on my right big toe I have an ingrown toenail against the second toe.  patient's had previous surgery on the heels  years ago by Dr. Berline Lopes and is had previous shockwave which was not successful and it seems that her proximally every 3 or 4 months there seems to be reoccurrence of pain   Review of Systems     Objective:   Physical Exam Neurovascular status intact muscle strength adequate with exquisite discomfort in the plantar aspect of the heel bilateral and an incurvated right hallux lateral border that's painful when pressed    Assessment:     Acute plantar fasciitis bilateral that's failed to respond treatment but does do okay with injection treatment occasionally and a chronic condition of the heels and arch    Plan:     Reviewed condition and we are trying to get the symptoms to stay under control for longer periods of time. Patient had an night splint dispensed with all instructions on usage and today I injected the plantar fascia bilateral 3 mg Kenalog 5 mg Xylocaine

## 2014-08-04 NOTE — Patient Instructions (Signed)

## 2014-08-05 ENCOUNTER — Telehealth: Payer: Self-pay | Admitting: Podiatry

## 2014-08-05 NOTE — Telephone Encounter (Signed)
Patient called wanting to let Dr. Paulla Dolly know that she could not find the dates of service from when she had shockwave but that she had the endoscopic surgery in 2007.

## 2014-08-06 NOTE — Telephone Encounter (Signed)
This message came into my in-box yesterday, but I just saw it on 08/06/14. I did not realize my in box was up yet. I did forward to [place], but she told me to forward to you.

## 2014-08-31 ENCOUNTER — Telehealth: Payer: Self-pay | Admitting: *Deleted

## 2014-08-31 NOTE — Telephone Encounter (Signed)
Pt states the injections are not lasting even 1 week, and Vicente Males was going to check with Dr. Paulla Dolly as to what can be done.  Pt states she is reluctant to come in if nothing else can be done, she' had B/L surgery and EPAT and wears orthotics.

## 2014-10-20 ENCOUNTER — Other Ambulatory Visit (HOSPITAL_COMMUNITY): Payer: Self-pay | Admitting: Family Medicine

## 2014-10-20 DIAGNOSIS — Z1231 Encounter for screening mammogram for malignant neoplasm of breast: Secondary | ICD-10-CM

## 2014-11-15 ENCOUNTER — Ambulatory Visit (HOSPITAL_COMMUNITY)
Admission: RE | Admit: 2014-11-15 | Discharge: 2014-11-15 | Disposition: A | Discharge status: Home / Self Care | Payer: BLUE CROSS/BLUE SHIELD | Source: Ambulatory Visit | Attending: Family Medicine | Admitting: Family Medicine

## 2014-11-15 DIAGNOSIS — Z1231 Encounter for screening mammogram for malignant neoplasm of breast: Secondary | ICD-10-CM | POA: Diagnosis present

## 2015-02-21 ENCOUNTER — Telehealth (HOSPITAL_COMMUNITY): Payer: Self-pay | Admitting: Physical Therapy

## 2015-02-21 NOTE — Telephone Encounter (Signed)
Pt cx apptment she will call us back if she wants to reschedule. Called MD office spoke to [facility] (Dr. Theda Sers [phone]) to let them know pt cx. Hoyle Sauer requested that we let the MD office know when and if the pt rescheudles. NF 02/10/15

## 2015-02-22 ENCOUNTER — Ambulatory Visit (HOSPITAL_COMMUNITY): Payer: BLUE CROSS/BLUE SHIELD | Admitting: Physical Therapy

## 2015-03-09 ENCOUNTER — Encounter: Payer: Self-pay | Admitting: Podiatry

## 2015-03-09 ENCOUNTER — Ambulatory Visit (INDEPENDENT_AMBULATORY_CARE_PROVIDER_SITE_OTHER): Payer: BLUE CROSS/BLUE SHIELD | Admitting: Podiatry

## 2015-03-09 VITALS — BP 113/55 | HR 100 | Resp 16

## 2015-03-09 DIAGNOSIS — M722 Plantar fascial fibromatosis: Secondary | ICD-10-CM

## 2015-03-09 MED ORDER — TRIAMCINOLONE ACETONIDE 10 MG/ML IJ SUSP
10.0000 mg | Freq: Once | INTRAMUSCULAR | Status: AC
  Administered 2015-03-09: 10 mg

## 2015-03-09 NOTE — Progress Notes (Signed)
Subjective:     Patient ID: Joyce Herrera, female   DOB: [birth year], 55 y.o.   MRN: [id number]  HPI patient states she still getting a lot of pain in her heels and she did get 4-5 months of relief but it doesn't always recur and the night splint seems to help a little bit   Review of Systems     Objective:   Physical Exam Neurovascular status intact with continued fasciitis-like symptoms bilateral at the insertion of the tendon into the calcaneus    Assessment:     Continued acute plantar fasciitis bilateral    Plan:     Reinjected the plantar fascia bilateral 3 mg Kenalog 5 mg Xylocaine and instructed on physical therapy supportive therapy

## 2015-03-15 ENCOUNTER — Telehealth: Payer: Self-pay | Admitting: *Deleted

## 2015-03-15 NOTE — Telephone Encounter (Signed)
Pt states the cortisone injections injections helped for a while, but now has pain in the ball of her feet.  I told pt we routinely see pts again in 2 weeks after injections for plantar fasciitis, and I recommended she be seen again, and to ice feet by placing ice pack on the floor covered with light towel and place entire foot on the ice for 10-15 minutes at least 3-4 times daily, not to lay in bed with the ice on the feet as pt had been.  Pt asked if she could take an antiinflammatory medication, and I told her she could begin OTC Ibuprofen or Aleve if she tolerated either of those, take one or the other as directed by package, not to interchange the two.  Transferred to schedulers.

## 2015-03-17 ENCOUNTER — Ambulatory Visit (HOSPITAL_COMMUNITY)
Admission: RE | Admit: 2015-03-17 | Discharge: 2015-03-17 | Disposition: A | Discharge status: Home / Self Care | Payer: BLUE CROSS/BLUE SHIELD | Source: Ambulatory Visit | Attending: Family Medicine | Admitting: Family Medicine

## 2015-03-17 ENCOUNTER — Other Ambulatory Visit (HOSPITAL_COMMUNITY): Payer: Self-pay | Admitting: Family Medicine

## 2015-03-17 DIAGNOSIS — E663 Overweight: Secondary | ICD-10-CM | POA: Insufficient documentation

## 2015-03-17 DIAGNOSIS — R05 Cough: Secondary | ICD-10-CM | POA: Insufficient documentation

## 2015-03-17 DIAGNOSIS — Z6827 Body mass index (BMI) 27.0-27.9, adult: Secondary | ICD-10-CM | POA: Insufficient documentation

## 2015-03-17 DIAGNOSIS — R059 Cough, unspecified: Secondary | ICD-10-CM

## 2015-03-21 ENCOUNTER — Ambulatory Visit (INDEPENDENT_AMBULATORY_CARE_PROVIDER_SITE_OTHER): Payer: BLUE CROSS/BLUE SHIELD | Admitting: Podiatry

## 2015-03-21 DIAGNOSIS — G589 Mononeuropathy, unspecified: Secondary | ICD-10-CM

## 2015-03-21 DIAGNOSIS — M722 Plantar fascial fibromatosis: Secondary | ICD-10-CM

## 2015-03-21 MED ORDER — PREDNISONE 10 MG PO TABS: ORAL_TABLET | ORAL | Status: DC

## 2015-03-21 NOTE — Progress Notes (Signed)
Patient ID: Joyce Herrera, female   DOB: September 12, 1960, 55 y.o.   MRN: [id number] Patient presents stating her pain is worse than she has ever experienced.. After the injection the entire foot hurts throbs, pins and needles all the time can not sleep and toes are turning purple.

## 2015-03-22 NOTE — Progress Notes (Signed)
Subjective:     Patient ID: Joyce Herrera, female   DOB: December 28, 1960, 55 y.o.   MRN: [id number]  HPI patient states she had a very unusual reaction after having had her injections last time with pins and needles feeling in her forefoot and while it is improving it is still present   Review of Systems     Objective:   Physical Exam  neurovascular status is found to be normal with no significant pathology with patient found to have redness in the plantar feet but no drainage or indications of streaking or systemic signs of infection    Assessment:      appears to be some form of neurological reaction    Plan:      advised on soaks therapy stretching exercises and placed on sterile prep DS 12 day Dosepak. Continue taking her Lyrica and reappoint if symptoms continue or if anything were to happen adverse

## 2015-03-23 ENCOUNTER — Ambulatory Visit (HOSPITAL_COMMUNITY): Payer: BLUE CROSS/BLUE SHIELD | Attending: Specialist | Admitting: Physical Therapy

## 2015-03-23 DIAGNOSIS — M6281 Muscle weakness (generalized): Secondary | ICD-10-CM

## 2015-03-23 DIAGNOSIS — M25562 Pain in left knee: Secondary | ICD-10-CM | POA: Diagnosis present

## 2015-03-23 DIAGNOSIS — Z9889 Other specified postprocedural states: Secondary | ICD-10-CM | POA: Diagnosis present

## 2015-03-23 NOTE — Patient Instructions (Signed)
Bridge    Lie back, legs bent. Inhale, pressing hips up. Keeping ribs in, lengthen lower back. Exhale, rolling down along spine from top. Repeat __10-15__ times. Do _2-3___ sessions per day.  http://pm.exer.us/55   Copyright  VHI. All rights reserved.     HIP ABDUCTION - SIDELYING  While lying on your side, slowly raise up your top leg to the side. Keep your knee straight and maintain your toes pointed forward the entire time.   The bottom leg can be bent to stabilize your body. Make sure you are stacking your hips and not rolling back or forwards.  Repeat 10 times each leg, 2-3 times per day.  Knee Extension: Sit to Stand (Eccentric)    Stand close to chair. Slowly lower self to seated position. _10-15__ reps per set, _2-3__ sets per day, _7__ days per week. Progress to stopping midway before lowering to chair. Progress to barely touching chair.  Make sure you are keeping your butt stuck back and that your knees do not go in front of your toes.    Copyright  VHI. All rights reserved.

## 2015-03-23 NOTE — Therapy (Signed)
[place], [place], [address] Phone: [phone]   Fax:  [phone]  Physical Therapy Evaluation  Patient Details  Name: Joyce Herrera MRN: [id number] Date of Birth: 08/16/1960 Referring Provider: Hart Robinsons   Encounter Date: 03/23/2015      PT End of Session - 03/23/15 1730    Visit Number 1   Number of Visits 8   Date for PT Re-Evaluation 04/20/15   Authorization Type [employer] Advantage PPO (30 OT/PT/Chiro, 0 used at time of eval)    Authorization Time Period 03/23/15 to 04/20/15   PT Start Time 1605   PT Stop Time 1644   PT Time Calculation (min) 39 min   Activity Tolerance Patient tolerated treatment well   Behavior During Therapy [facility] for tasks assessed/performed      Past Medical History  Diagnosis Date  . Hypothyroidism   . Seasonal allergies   . History of colon polyps   . Headache(784.0)   . PONV (postoperative nausea and vomiting)     Past Surgical History  Procedure Laterality Date  . Cholecystectomy    . Abdominal hysterectomy    . Tonsillectomy    . Nasal septum surgery    . Plantar fasciectomy      bilateral  . Colonoscopy  09/14/2010    Procedure: COLONOSCOPY;  Surgeon: Rogene Houston, MD;  [facility];  Service: Endoscopy;  Laterality: N/A;    There were no vitals filed for this visit.  Visit Diagnosis:  S/P left knee arthroscopy - Plan: PT plan of care cert/re-cert  Muscle weakness - Plan: PT plan of care cert/re-cert  Left knee pain - Plan: PT plan of care cert/re-cert      Subjective Assessment - 03/23/15 1613    Subjective Patient reports that she is not sure exactly what led up to surgery; she did fall 2 years ago and had some hip pain and was x-rayed with no significant problems. Pain went down her leg and they got knee imaged, which MD said showed a torn meniscus. MD went in to clear up tear and to clear up some arthritis with a L knee scope on 02/15/15. Has  been doing some mild exercising since surgery. Has not tried stairs but when she walks sometimes she does have a catching. No falls or close calls.    Pertinent History PMH generally unremarkable    How long can you sit comfortably? unlimited    How long can you stand comfortably? feet would be limiting factor    How long can you walk comfortably? patient has not been walking long distances since her fall two years ago    Patient Stated Goals to get back walking    Currently in Pain? No/denies            [facility] PT Assessment - 03/23/15 0001    Assessment   Medical Diagnosis L knee scope    Referring Provider Hart Robinsons    Onset Date/Surgical Date 02/15/15   Next MD Visit mid-late March with Dr. Theda Sers    Precautions   Precautions None   Restrictions   Weight Bearing Restrictions No   Balance Screen   Has the patient fallen in the past 6 months No   Has the patient had a decrease in activity level because of a fear of falling?  No   Is the patient reluctant to leave their home because of a fear of falling?  No  Prior Function   Level of Independence Independent;Independent with basic ADLs;Independent with gait;Independent with transfers   Vocation Other (comment)   Leisure walking, yard work    Observation/Other Assessments   Focus on Therapeutic Outcomes (FOTO)  40% limited    AROM   Left Knee Extension 2   Left Knee Flexion 132   Strength   Right Hip Flexion 4-/5   Right Hip Extension 4/5   Right Hip ABduction 4/5   Left Hip Flexion 4/5   Left Hip Extension 4/5   Left Hip ABduction 4/5   Right Knee Flexion 4/5   Right Knee Extension 3+/5   Left Knee Flexion 4/5   Left Knee Extension 4-/5   Right Ankle Dorsiflexion 4+/5   Left Ankle Dorsiflexion 4/5   Transfers   Five time sit to stand comments  12.06   Ambulation/Gait   Gait Comments no significant deviations noted    6 minute walk test results    Aerobic Endurance Distance Walked 728   Endurance  additional comments 3MWT                            PT Education - 03/23/15 1730    Education provided Yes   Education Details prognosis, plan of care, HEP    Person(s) Educated Patient   Methods Explanation;Handout;Demonstration   Comprehension Verbalized understanding;Need further instruction;Returned demonstration          PT Short Term Goals - 03/23/15 1738    PT SHORT TERM GOAL #1   Title Patient to be able to complete 5x sit to stand in 9 seconds or less in order to demonstrate improved muscle power and overall mobility    Time 2   Period Weeks   Status New   PT SHORT TERM GOAL #2   Title Patient to be able to ambulate at least 1234ft during 6MWT with no rest breaks or unsteadiness in order to demonstrate readiness to return to community    Time 2   Period Weeks   Status New   PT SHORT TERM GOAL #3   Title Patient to be independent in correctly and consistently performing appropriate HEP, to be updated PRN    Time 2   Period Weeks   Status New           PT Long Term Goals - 03/23/15 1739    PT LONG TERM GOAL #1   Title Patient to demonstrate strength 5/5 in all tested muscle groups  in order to reduce pain and improve general function at home and in community    Time 4   Period Weeks   Status New   PT LONG TERM GOAL #2   Title Patient to be able to demonstrate proper squat form so that she can complete yardwork with correct mechanics and without injury risk to other parts of the kinetic chain    Time 4   Period Weeks   Status New   PT LONG TERM GOAL #3   Title Patient to return to regular walking program at least 4 days per week, for at least 30 minutes in duration, in order to assist in maintaining functional gains and returning to PLOF    Time [place] - 03/23/15 1731    Clinical Impression Statement Patient arrives status post L knee scope  which was performed on 02/15/15; she reports  that she has been doing some minor exercises (LAQs and hip/knee flexion) and has not had any major problems with her knee except that when she is walking she does still get a catching/grabbing feeling. Upon examination did note some muscle weakness, reduced muscle power, and difficulty with proper squat form; however, ROM is excellent for this ponit of her recovery, she has minimal pain, and little to no edema surrounding her surgical site. At this point patient appears to have excellent potential with skilled PT services, and recommend skilled PT services to address functional limitations and assisit in reaching optimal level of function.    Pt will benefit from skilled therapeutic intervention in order to improve on the following deficits Decreased strength;Pain;Decreased coordination;Improper body mechanics   Rehab Potential Excellent   PT Frequency 2x / week   PT Duration 4 weeks   PT Treatment/Interventions ADLs/Self Care Home Management;Cryotherapy;Electrical Stimulation;Gait training;Stair training;Functional mobility training;Therapeutic activities;Therapeutic exercise;Balance training;Neuromuscular re-education;Patient/family education;Manual techniques;Energy conservation;Taping   PT Next Visit Plan review HEP and goals; functional strength training, work on squat form within pain limits, manual PRN    PT Home Exercise Plan given    Consulted and Agree with Plan of Care Patient         Problem List There are no active problems to display for this patient.   Deniece Ree PT, DPT [phone]  [facility] [address] [place], [place], [address] Phone: [phone]   Fax:  [phone]  Name: BELLAMI ERNSTER MRN: [id number] Date of Birth: [birth year]

## 2015-03-29 NOTE — Addendum Note (Signed)
Addended by: Hunt Oris on: 03/29/2015 06:29 PM   Modules accepted: Orders

## 2015-04-05 ENCOUNTER — Ambulatory Visit (HOSPITAL_COMMUNITY): Payer: BLUE CROSS/BLUE SHIELD | Attending: Specialist | Admitting: Physical Therapy

## 2015-04-05 DIAGNOSIS — M6281 Muscle weakness (generalized): Secondary | ICD-10-CM

## 2015-04-05 DIAGNOSIS — M25562 Pain in left knee: Secondary | ICD-10-CM | POA: Diagnosis present

## 2015-04-05 DIAGNOSIS — Z9889 Other specified postprocedural states: Secondary | ICD-10-CM | POA: Insufficient documentation

## 2015-04-05 NOTE — Therapy (Signed)
[place], [place], [address] Phone: [phone]   Fax:  [phone]  Physical Therapy Treatment  Patient Details  Name: Joyce Herrera MRN: [id number] Date of Birth: 11-14-1960 Referring Provider: Hart Robinsons   Encounter Date: 04/05/2015      PT End of Session - 04/05/15 1838    Visit Number 2   Number of Visits 8   Date for PT Re-Evaluation 04/20/15   Authorization Type [employer] Advantage PPO (30 OT/PT/Chiro, 0 used at time of eval)    Authorization Time Period 03/23/15 to 04/20/15   PT Start Time 1520   PT Stop Time 1600   PT Time Calculation (min) 40 min   Activity Tolerance Patient tolerated treatment well   Behavior During Therapy [facility] for tasks assessed/performed      Past Medical History  Diagnosis Date  . Hypothyroidism   . Seasonal allergies   . History of colon polyps   . Headache(784.0)   . PONV (postoperative nausea and vomiting)     Past Surgical History  Procedure Laterality Date  . Cholecystectomy    . Abdominal hysterectomy    . Tonsillectomy    . Nasal septum surgery    . Plantar fasciectomy      bilateral  . Colonoscopy  09/14/2010    Procedure: COLONOSCOPY;  Surgeon: Rogene Houston, MD;  [facility];  Service: Endoscopy;  Laterality: N/A;    There were no vitals filed for this visit.  Visit Diagnosis:  S/P left knee arthroscopy  Muscle weakness  Left knee pain      Subjective Assessment - 04/05/15 1836    Subjective PT states she's been doing her HEP and is overall doing well.  States she is currently not having any pain.  States it continues to swell and she has been using ice to help.   Currently in Pain? No/denies                         [facility] Adult PT Treatment/Exercise - 04/05/15 1634    Knee/Hip Exercises: Seated   Long Arc Quad Left;10 reps   Knee/Hip Exercises: Supine   Bridges 15 reps   Straight Leg Raises Both;15 reps   Knee/Hip  Exercises: Sidelying   Hip ABduction Both;15 reps   Knee/Hip Exercises: Prone   Hamstring Curl 15 reps   Hip Extension Both;15 reps                PT Education - 04/05/15 1636    Education provided Yes   Education Details Review of initial evaluation/goals.  HEP    Person(s) Educated Patient   Methods Explanation;Demonstration;Handout   Comprehension Verbalized understanding;Returned demonstration;Verbal cues required;Tactile cues required          PT Short Term Goals - 03/23/15 1738    PT SHORT TERM GOAL #1   Title Patient to be able to complete 5x sit to stand in 9 seconds or less in order to demonstrate improved muscle power and overall mobility    Time 2   Period Weeks   Status New   PT SHORT TERM GOAL #2   Title Patient to be able to ambulate at least 1230ft during 6MWT with no rest breaks or unsteadiness in order to demonstrate readiness to return to community    Time 2   Period Weeks   Status New   PT SHORT TERM GOAL #3   Title  Patient to be independent in correctly and consistently performing appropriate HEP, to be updated PRN    Time 2   Period Weeks   Status New           PT Long Term Goals - 03/23/15 1739    PT LONG TERM GOAL #1   Title Patient to demonstrate strength 5/5 in all tested muscle groups  in order to reduce pain and improve general function at home and in community    Time 4   Period Weeks   Status New   PT LONG TERM GOAL #2   Title Patient to be able to demonstrate proper squat form so that she can complete yardwork with correct mechanics and without injury risk to other parts of the kinetic chain    Time 4   Period Weeks   Status New   PT LONG TERM GOAL #3   Title Patient to return to regular walking program at least 4 days per week, for at least 30 minutes in duration, in order to assist in maintaining functional gains and returning to PLOF    Time 4   Period Weeks   Status New               Plan - 04/05/15 1838     Clinical Impression Statement Initial evaluation including goals reviewed with patient.  Pt states squatting to get into her low cabinets are currently the most difficulty for patient.  Reports overall compliance with HEP and displays good form with established therex.  Pt is ready at this point to progress to closed chain functional therex.     Pt will benefit from skilled therapeutic intervention in order to improve on the following deficits Decreased strength;Pain;Decreased coordination;Improper body mechanics   Rehab Potential Excellent   PT Frequency 2x / week   PT Duration 4 weeks   PT Treatment/Interventions ADLs/Self Care Home Management;Cryotherapy;Electrical Stimulation;Gait training;Stair training;Functional mobility training;Therapeutic activities;Therapeutic exercise;Balance training;Neuromuscular re-education;Patient/family education;Manual techniques;Energy conservation;Taping   PT Next Visit Plan Progress closed chain therex, working on squats within pain limits and progressing to stairs.    PT Home Exercise Plan given    Consulted and Agree with Plan of Care Patient        Problem List There are no active problems to display for this patient.   Teena Irani, PTA/CLT [phone]  04/05/2015, 6:45 PM  [facility] [address] [place], [place], [address] Phone: [phone]   Fax:  [phone]  Name: DYLANA GALEY MRN: [id number] Date of Birth: [birth year]

## 2015-04-07 ENCOUNTER — Ambulatory Visit (HOSPITAL_COMMUNITY): Payer: BLUE CROSS/BLUE SHIELD

## 2015-04-07 ENCOUNTER — Telehealth (HOSPITAL_COMMUNITY): Payer: Self-pay

## 2015-04-07 NOTE — Telephone Encounter (Signed)
No show, unable to reach preferred home number.  Called and left message on mobile about missed apt.  Included next apt date and time with contact info given and included no show policy.  [address] [place]; [place] [phone]

## 2015-04-12 ENCOUNTER — Ambulatory Visit (HOSPITAL_COMMUNITY): Payer: BLUE CROSS/BLUE SHIELD | Admitting: Physical Therapy

## 2015-04-12 DIAGNOSIS — Z9889 Other specified postprocedural states: Secondary | ICD-10-CM | POA: Diagnosis not present

## 2015-04-12 DIAGNOSIS — M6281 Muscle weakness (generalized): Secondary | ICD-10-CM

## 2015-04-12 DIAGNOSIS — M25562 Pain in left knee: Secondary | ICD-10-CM

## 2015-04-12 NOTE — Therapy (Signed)
[place], [place], [address] Phone: [phone]   Fax:  [phone]  Physical Therapy Treatment  Patient Details  Name: Joyce Herrera MRN: [id number] Date of Birth: [birth year] Referring Provider: Hart Robinsons   Encounter Date: 04/12/2015      PT End of Session - 04/12/15 1531    Visit Number 3   Number of Visits 8   Date for PT Re-Evaluation 04/20/15   Authorization Type [employer] Advantage PPO (30 OT/PT/Chiro, 0 used at time of eval)    Authorization Time Period 03/23/15 to 04/20/15   PT Start Time 1432   PT Stop Time 1520   PT Time Calculation (min) 48 min   Activity Tolerance Patient tolerated treatment well   Behavior During Therapy [facility] for tasks assessed/performed      Past Medical History  Diagnosis Date  . Hypothyroidism   . Seasonal allergies   . History of colon polyps   . Headache(784.0)   . PONV (postoperative nausea and vomiting)     Past Surgical History  Procedure Laterality Date  . Cholecystectomy    . Abdominal hysterectomy    . Tonsillectomy    . Nasal septum surgery    . Plantar fasciectomy      bilateral  . Colonoscopy  09/14/2010    Procedure: COLONOSCOPY;  Surgeon: Rogene Houston, MD;  [facility];  Service: Endoscopy;  Laterality: N/A;    There were no vitals filed for this visit.  Visit Diagnosis:  S/P left knee arthroscopy  Muscle weakness  Left knee pain      Subjective Assessment - 04/12/15 1444    Subjective Pt states it was so nice last thursday and she got busy in her yard, forgetting her scheduled rehab appointment.  STates her knee also felt pretty good that day.  Pt states she is hurting today pretty bad.  Reports Lt knee pain of 5/10 and all over pain in to Blateral hips and back higher but no given number.    Currently in Pain? Yes   Pain Score 5    Pain Orientation Left                         [facility] Adult PT  Treatment/Exercise - 04/12/15 1507    Knee/Hip Exercises: Stretches   Active Hamstring Stretch Both;3 reps;30 seconds   Active Hamstring Stretch Limitations rope   Other Knee/Hip Stretches KTC stretch 3X30" each   Knee/Hip Exercises: Seated   Long Arc Quad Left;10 reps   Knee/Hip Exercises: Supine   Bridges 15 reps   Straight Leg Raises Both;15 reps   Knee/Hip Exercises: Sidelying   Hip ABduction Both;15 reps   Knee/Hip Exercises: Prone   Hamstring Curl 15 reps   Hip Extension Both;15 reps                  PT Short Term Goals - 03/23/15 1738    PT SHORT TERM GOAL #1   Title Patient to be able to complete 5x sit to stand in 9 seconds or less in order to demonstrate improved muscle power and overall mobility    Time 2   Period Weeks   Status New   PT SHORT TERM GOAL #2   Title Patient to be able to ambulate at least 1266ft during 6MWT with no rest breaks or unsteadiness in order to demonstrate readiness to return to community    Time  2   Period Weeks   Status New   PT SHORT TERM GOAL #3   Title Patient to be independent in correctly and consistently performing appropriate HEP, to be updated PRN    Time 2   Period Weeks   Status New           PT Long Term Goals - 03/23/15 1739    PT LONG TERM GOAL #1   Title Patient to demonstrate strength 5/5 in all tested muscle groups  in order to reduce pain and improve general function at home and in community    Time 4   Period Weeks   Status New   PT LONG TERM GOAL #2   Title Patient to be able to demonstrate proper squat form so that she can complete yardwork with correct mechanics and without injury risk to other parts of the kinetic chain    Time 4   Period Weeks   Status New   PT LONG TERM GOAL #3   Title Patient to return to regular walking program at least 4 days per week, for at least 30 minutes in duration, in order to assist in maintaining functional gains and returning to PLOF    Time 4   Period Weeks    Status New               Plan - 04/12/15 1532    Clinical Impression Statement PT with increased LB irritation today.  Added hamstring and knee to chest stretch to increase knee and hip mobility. Pt reported overall reduced lumbar symptoms after completing these as well.  Pt with continued need for therapist faciltitation for form with most therex.  No standing therex added today due to increased knee pain today.     Pt will benefit from skilled therapeutic intervention in order to improve on the following deficits Decreased strength;Pain;Decreased coordination;Improper body mechanics   Rehab Potential Excellent   PT Frequency 2x / week   PT Duration 4 weeks   PT Treatment/Interventions ADLs/Self Care Home Management;Cryotherapy;Electrical Stimulation;Gait training;Stair training;Functional mobility training;Therapeutic activities;Therapeutic exercise;Balance training;Neuromuscular re-education;Patient/family education;Manual techniques;Energy conservation;Taping   PT Next Visit Plan Progress closed chain therex, working on squats within pain limits and progressing to stairs.    PT Home Exercise Plan given    Consulted and Agree with Plan of Care Patient        Problem List There are no active problems to display for this patient.   Teena Irani, PTA/CLT [phone]  04/12/2015, 3:36 PM  [facility] [address] [place], [place], [address] Phone: [phone]   Fax:  [phone]  Name: Joyce Herrera MRN: [id number] Date of Birth: Mar 20, 1960

## 2015-04-14 ENCOUNTER — Ambulatory Visit (HOSPITAL_COMMUNITY): Payer: BLUE CROSS/BLUE SHIELD | Admitting: Physical Therapy

## 2015-04-14 DIAGNOSIS — M25562 Pain in left knee: Secondary | ICD-10-CM

## 2015-04-14 DIAGNOSIS — M6281 Muscle weakness (generalized): Secondary | ICD-10-CM

## 2015-04-14 DIAGNOSIS — Z9889 Other specified postprocedural states: Secondary | ICD-10-CM

## 2015-04-14 NOTE — Therapy (Signed)
[place], [place], [address] Phone: [phone]   Fax:  [phone]  Physical Therapy Treatment  Patient Details  Name: Joyce Herrera MRN: [id number] Date of Birth: 1961/01/10 Referring Provider: Hart Robinsons   Encounter Date: 04/14/2015      PT End of Session - 04/14/15 1510    Visit Number 4   Number of Visits 8   Date for PT Re-Evaluation 04/20/15   Authorization Type [employer] Advantage PPO (30 OT/PT/Chiro, 0 used at time of eval)    Authorization Time Period 03/23/15 to 04/20/15   PT Start Time 1420   PT Stop Time 1515   PT Time Calculation (min) 55 min   Activity Tolerance Patient tolerated treatment well   Behavior During Therapy [facility] for tasks assessed/performed      Past Medical History  Diagnosis Date  . Hypothyroidism   . Seasonal allergies   . History of colon polyps   . Headache(784.0)   . PONV (postoperative nausea and vomiting)     Past Surgical History  Procedure Laterality Date  . Cholecystectomy    . Abdominal hysterectomy    . Tonsillectomy    . Nasal septum surgery    . Plantar fasciectomy      bilateral  . Colonoscopy  09/14/2010    Procedure: COLONOSCOPY;  Surgeon: Rogene Houston, MD;  [facility];  Service: Endoscopy;  Laterality: N/A;    There were no vitals filed for this visit.  Visit Diagnosis:  S/P left knee arthroscopy  Muscle weakness  Left knee pain      Subjective Assessment - 04/14/15 1515    Subjective PT states her knee feels better today . States she really only has pain with certain movements.     Currently in Pain? No/denies                         [facility] Adult PT Treatment/Exercise - 04/14/15 1446    Knee/Hip Exercises: Stretches   Active Hamstring Stretch Both;3 reps;30 seconds   Other Knee/Hip Stretches KTC stretch 3X30" each   Knee/Hip Exercises: Standing   Heel Raises 10 reps   Heel Raises Limitations toeraises 10  reps   Forward Lunges Both;10 reps   Forward Lunges Limitations 4" no UE's   Lateral Step Up Both;10 reps;Step Height: 4";Hand Hold: 1   Lateral Step Up Limitations 4"   Forward Step Up Both;10 reps;Step Height: 4"   Forward Step Up Limitations 4"   Knee/Hip Exercises: Supine   Bridges 15 reps   Straight Leg Raises Both;15 reps   Knee/Hip Exercises: Sidelying   Hip ABduction Both;15 reps   Knee/Hip Exercises: Prone   Hamstring Curl 15 reps   Hip Extension Both;15 reps                  PT Short Term Goals - 03/23/15 1738    PT SHORT TERM GOAL #1   Title Patient to be able to complete 5x sit to stand in 9 seconds or less in order to demonstrate improved muscle power and overall mobility    Time 2   Period Weeks   Status New   PT SHORT TERM GOAL #2   Title Patient to be able to ambulate at least 1266ft during 6MWT with no rest breaks or unsteadiness in order to demonstrate readiness to return to community    Time 2   Period Weeks  Status New   PT SHORT TERM GOAL #3   Title Patient to be independent in correctly and consistently performing appropriate HEP, to be updated PRN    Time 2   Period Weeks   Status New           PT Long Term Goals - 03/23/15 1739    PT LONG TERM GOAL #1   Title Patient to demonstrate strength 5/5 in all tested muscle groups  in order to reduce pain and improve general function at home and in community    Time 4   Period Weeks   Status New   PT LONG TERM GOAL #2   Title Patient to be able to demonstrate proper squat form so that she can complete yardwork with correct mechanics and without injury risk to other parts of the kinetic chain    Time 4   Period Weeks   Status New   PT LONG TERM GOAL #3   Title Patient to return to regular walking program at least 4 days per week, for at least 30 minutes in duration, in order to assist in maintaining functional gains and returning to PLOF    Time 4   Period Weeks   Status New                Plan - 04/14/15 1511    Clinical Impression Statement Pt with decreased symptoms today.  Progressed with closed chain therex wtihout c/o pain. Pt required therapist facilitation to complete therex in correct form, squats most difficult.  Pt without pain at end of session.   Pt will benefit from skilled therapeutic intervention in order to improve on the following deficits Decreased strength;Pain;Decreased coordination;Improper body mechanics   Rehab Potential Excellent   PT Frequency 2x / week   PT Duration 4 weeks   PT Treatment/Interventions ADLs/Self Care Home Management;Cryotherapy;Electrical Stimulation;Gait training;Stair training;Functional mobility training;Therapeutic activities;Therapeutic exercise;Balance training;Neuromuscular re-education;Patient/family education;Manual techniques;Energy conservation;Taping   PT Next Visit Plan Continue to progress closed chain therex, working on squats within pain limits and progressing to stairs.    PT Home Exercise Plan given    Consulted and Agree with Plan of Care Patient        Problem List There are no active problems to display for this patient.   Teena Irani, PTA/CLT [phone] 04/14/2015, 3:15 PM  [facility] [address] [place], [place], [address] Phone: [phone]   Fax:  [phone]  Name: Joyce Herrera MRN: [id number] Date of Birth: [birth year]

## 2015-04-19 ENCOUNTER — Encounter (HOSPITAL_COMMUNITY): Payer: BLUE CROSS/BLUE SHIELD | Admitting: Physical Therapy

## 2015-04-21 ENCOUNTER — Ambulatory Visit (HOSPITAL_COMMUNITY): Payer: BLUE CROSS/BLUE SHIELD

## 2015-04-21 DIAGNOSIS — M6281 Muscle weakness (generalized): Secondary | ICD-10-CM

## 2015-04-21 DIAGNOSIS — Z9889 Other specified postprocedural states: Secondary | ICD-10-CM | POA: Diagnosis not present

## 2015-04-21 DIAGNOSIS — M25562 Pain in left knee: Secondary | ICD-10-CM

## 2015-04-21 NOTE — Therapy (Signed)
[place], [place], [address] Phone: [phone]   Fax:  [phone]  Physical Therapy Treatment  Patient Details  Name: Joyce Herrera MRN: [id number] Date of Birth: 1960-03-18 Referring Provider: Dr. Sydnee Cabal  Encounter Date: 04/21/2015      PT End of Session - 04/21/15 1518    Visit Number 5   Number of Visits 8   Date for PT Re-Evaluation 05/20/15   Authorization Type [employer] Ad[employer]age PPO (30 OT/PT/Chiro, 0 used at time of eval)    Authorization Time Period 04/21/2015 to 05/26/2015    PT Start Time 1432   PT Stop Time 1515   PT Time Calculation (min) 43 min   Activity Tolerance Patient tolerated treatment well   Behavior During Therapy [facility] for tasks assessed/performed      Past Medical History  Diagnosis Date  . Hypothyroidism   . Seasonal allergies   . History of colon polyps   . Headache(784.0)   . PONV (postoperative nausea and vomiting)     Past Surgical History  Procedure Laterality Date  . Cholecystectomy    . Abdominal hysterectomy    . Tonsillectomy    . Nasal septum surgery    . Plantar fasciectomy      bilateral  . Colonoscopy  09/14/2010    Procedure: COLONOSCOPY;  Surgeon: Rogene Houston, MD;  [facility];  Service: Endoscopy;  Laterality: N/A;    There were no vitals filed for this visit.  Visit Diagnosis:  Left knee pain - Plan: PT plan of care cert/re-cert  Muscle weakness - Plan: PT plan of care cert/re-cert  S/P left knee arthroscopy - Plan: PT plan of care cert/re-cert      Subjective Assessment - 04/21/15 1436    Subjective Pt reports no significant improvements in regards to her pain levels since beginning with PT. Pt reports that she has a f/u appt this upcoming Monday, 04/25/15, with her surgeon and would like to discuss her complaints before continuing with outpatient PT. Pt noted that her pain fluctuates from day to day and is pain-free at times. Her  pain is located over the medial joint line of the L knee and was rated a 6/10 on a VAS upon arrival to clinic.     Pertinent History PMH generally unremarkable    Limitations Standing;Walking   How long can you sit comfortably? unlimited    How long can you stand comfortably? Unknown    How long can you walk comfortably? 15 minutes    Patient Stated Goals to get back walking and reduce pain    Currently in Pain? Yes   Pain Score 6   L knee pain has ranged between a 0-7/10 on a VAS since last week    Pain Orientation Left;Medial   Pain Descriptors / Indicators Throbbing;Jabbing   Pain Type Surgical pain   Pain Radiating Towards N/A   Pain Onset More than a month ago   Pain Frequency Intermittent   Aggravating Factors  walking and prolonged standing    Pain Relieving Factors ice, heat, and hot tub   Effect of Pain on Daily Activities limiting her ability to participate with recreational walking             [facility] PT Assessment - 04/21/15 0001    Assessment   Medical Diagnosis L knee arthroscopy   Referring Provider Dr. Sydnee Cabal   Onset Date/Surgical Date 02/15/15   Next MD  Visit 04/25/15   Precautions   Precautions None   Restrictions   Weight Bearing Restrictions No   Balance Screen   Has the patient fallen in the past 6 months No   Has the patient had a decrease in activity level because of a fear of falling?  No   Is the patient reluctant to leave their home because of a fear of falling?  No   Prior Function   Level of Independence Independent;Independent with basic ADLs;Independent with gait;Independent with transfers   Vocation Other (comment)   Leisure walking, yard work    Observation/Other Assessments   Focus on Therapeutic Outcomes (FOTO)  Not assessed this visit    AROM   Left Knee Extension 0  L knee AROM= -3-0-133   Left Knee Flexion 133   Strength   Right Hip Flexion 4-/5   Right Hip Extension 4/5   Right Hip ABduction 4/5   Left Hip Flexion 4+/5    Left Hip Extension 4/5   Left Hip ABduction 4/5   Right Knee Flexion 5/5   Right Knee Extension 5/5   Left Knee Flexion 4/5   Left Knee Extension 4+/5   Right Ankle Dorsiflexion 5/5   Left Ankle Dorsiflexion 5/5   Transfers   Five time sit to stand comments  10 sec   Ambulation/Gait   Gait Comments no significant deviations noted    6 minute walk test results    Aerobic Endurance Distance Walked --  Not tested this visit    Endurance additional comments  Palpation  3MWT   Minor tenderness assessed over the medial joint line of the L knee                      [facility] Adult PT Treatment/Exercise - 04/21/15 0001    Knee/Hip Exercises: Stretches   Passive Hamstring Stretch 30 seconds;4 reps   Quad Stretch 30 seconds;4 reps   Knee/Hip Exercises: Supine   Straight Leg Raises Left;15 reps   Knee/Hip Exercises: Sidelying   Hip ABduction Left;1 set;15 reps   Manual Therapy   Manual Therapy Joint mobilization;Soft tissue mobilization;Passive ROM   Manual therapy comments Completed separately from ther ex   Joint Mobilization L tibiofemoral distraction with knee flexed to 90 degrees in short sit position in order to reduce pain; patellar mobs grades I-III in S<>I and M<>L direction to reduce pain and improve mobility    Soft tissue mobilization rolling stick to HS/ITB/quad    Passive ROM L knee                PT Education - 04/21/15 2043    Education provided Yes   Education Details HEP, PT re-eval findings, and use of cryotherapy to manage pain    Person(s) Educated Patient   Methods Explanation   Comprehension Verbalized understanding          PT Short Term Goals - 04/21/15 2056    PT SHORT TERM GOAL #1   Title Patient to be able to complete 5x sit to stand in 9 seconds or less in order to demonstrate improved muscle power and overall mobility    Baseline 10 sec    Time 2   Period Weeks   Status On-going   PT SHORT TERM GOAL #2   Title Patient to be  able to ambulate at least 124ft during 6MWT with no rest breaks or unsteadiness in order to demonstrate readiness to return to community  Time 2   Period Weeks   Status Unable to assess   PT SHORT TERM GOAL #3   Title Patient to be independent in correctly and consistently performing appropriate HEP, to be updated PRN    Time 2   Period Weeks   Status Achieved           PT Long Term Goals - 04/21/15 2057    PT LONG TERM GOAL #1   Title Patient to demonstrate strength 5/5 in all tested muscle groups  in order to reduce pain and improve general function at home and in community    Baseline L quad= 4+/5 MMT; L HS= 4/5 MMT    Time 4   Period Weeks   Status On-going   PT LONG TERM GOAL #2   Title Patient to be able to demonstrate proper squat form so that she can complete yardwork with correct mechanics and without injury risk to other parts of the kinetic chain    Baseline Verbal cues required for proper knee alignment and technique with squats    Time 4   Period Weeks   Status On-going   PT LONG TERM GOAL #3   Title Patient to return to regular walking program at least 4 days per week, for at least 30 minutes in duration, in order to assist in maintaining functional gains and returning to PLOF    Time 4   Period Weeks   Status On-going   PT LONG TERM GOAL #4   Title Patient will report decreased L knee pain to 0-3/10 on a VAS in order to improve tolerance with WB activities.    Baseline Created on 04/21/15   Time 5   Period Weeks   Status New               Plan - 04/21/15 2045    Clinical Impression Statement Mrs. Felderman is a 55 yo female who has been seen for a total of 5 outpatient PT visits s/p L knee arthroscopy. The pt has made progress towards stated goals with improved L quad/HS and hip strength; however, she continues to complain of minor to moderate L medial knee pain. L knee AROM is WNL and is pain-free at end range. Today's PT tx was focused on manual  therapy techniques to reduce L knee pain and improve L quad/ITB/HS tissue extensibility. Pt responded well to L patellar mobs in S<>I and M<>L direction using grades I-III and L tibiofemoral distraction with L knee pain reduced to 3-4/10 on a VAS. The pt would continue to benefit from skilled PT for an additional 2x/week for 5 weeks to address her complaints of L medial knee pain and further improve knee stabilization/strength in order to progress towards improved function with WB activities. Goals were reviewed and continue to be appropriate for desired outcomes. Continue with current POC with focus on manual therapy techniques to reduce L knee pain, CKC quad/HS strengthening, ITB/HS/quad stretches, and modalities as needed for pain.   Pt will benefit from skilled therapeutic intervention in order to improve on the following deficits Decreased strength;Pain;Decreased coordination;Improper body mechanics   Rehab Potential Excellent   PT Frequency 2x / week   PT Duration --  5 weeks    PT Treatment/Interventions ADLs/Self Care Home Management;Cryotherapy;Electrical Stimulation;Gait training;Stair training;Functional mobility training;Therapeutic activities;Therapeutic exercise;Balance training;Neuromuscular re-education;Patient/family education;Manual techniques;Energy conservation;Taping;Moist Heat;Passive range of motion   PT Next Visit Plan Continue to progress closed chain therex, working on squats within pain limits and progressing to  stairs. Continue with patellar mobs and tibiofemoral joint distraction for pain reduction   PT Home Exercise Plan Reviewed HEP with no changes made this visit    Consulted and Agree with Plan of Care Patient        Problem List There are no active problems to display for this patient.   Garen Lah, PT, DPT  04/21/2015, 9:05 PM  [facility] [facility] [address] [place], [place], [address] Phone: [phone]   Fax:   [phone]  Name: CHRISTIONNA HLINKA MRN: [id number] Date of Birth: 1960-03-29

## 2015-04-26 ENCOUNTER — Ambulatory Visit (HOSPITAL_COMMUNITY): Payer: BLUE CROSS/BLUE SHIELD | Admitting: Physical Therapy

## 2015-04-26 DIAGNOSIS — M25562 Pain in left knee: Secondary | ICD-10-CM

## 2015-04-26 DIAGNOSIS — Z9889 Other specified postprocedural states: Secondary | ICD-10-CM | POA: Diagnosis not present

## 2015-04-26 DIAGNOSIS — M6281 Muscle weakness (generalized): Secondary | ICD-10-CM

## 2015-04-26 NOTE — Patient Instructions (Signed)
Piriformis Stretch, Sitting    Sit, back straight, one leg straight, other leg bent, ankle on opposite thigh. Grasp knee and ankle of crossed leg. Pull leg toward trunk. Feel stretch in gluteals. Hold _30__ seconds.  Repeat _3__ times each leg  per session. Do 2___ sessions per day.  Copyright  V[employer]. All rights reserved.

## 2015-04-26 NOTE — Therapy (Signed)
[place], [place], [address] Phone: [phone]   Fax:  [phone]  Physical Therapy Treatment  Patient Details  Name: Joyce Herrera MRN: [id number] Date of Birth: 06/06/1960 Referring Provider: Dr. Sydnee Cabal  Encounter Date: 04/26/2015      PT End of Session - 04/26/15 1845    Visit Number 6   Number of Visits 8   Date for PT Re-Evaluation 05/20/15   Authorization Type [employer] Advantage PPO (30 OT/PT/Chiro, 0 used at time of eval)    Authorization Time Period 04/21/2015 to 05/26/2015    PT Start Time 1432   PT Stop Time 1515   PT Time Calculation (min) 43 min   Activity Tolerance Patient tolerated treatment well   Behavior During Therapy [facility] for tasks assessed/performed      Past Medical History  Diagnosis Date  . Hypothyroidism   . Seasonal allergies   . History of colon polyps   . Headache(784.0)   . PONV (postoperative nausea and vomiting)     Past Surgical History  Procedure Laterality Date  . Cholecystectomy    . Abdominal hysterectomy    . Tonsillectomy    . Nasal septum surgery    . Plantar fasciectomy      bilateral  . Colonoscopy  09/14/2010    Procedure: COLONOSCOPY;  Surgeon: Rogene Houston, MD;  [facility];  Service: Endoscopy;  Laterality: N/A;    There were no vitals filed for this visit.  Visit Diagnosis:  Left knee pain  Muscle weakness  S/P left knee arthroscopy      Subjective Assessment - 04/26/15 1436    Subjective Patient reports nothing major going on, arrived in flipflops but reports taht she will wear tennis shoes next time. She reports that the MD did not do a lot except doing some sort of shots in her knee; she also get fitted for a brace for it as well.    Pertinent History PMH generally unremarkable    Currently in Pain? Yes   Pain Score 4    Pain Location Other (Comment)  thumb    Pain Orientation Other (Comment);Left                          [facility] Adult PT Treatment/Exercise - 04/26/15 0001    Knee/Hip Exercises: Stretches   Active Hamstring Stretch Both;3 reps;30 seconds   Passive Hamstring Stretch 3 reps;30 seconds   Passive Hamstring Stretch Limitations gastroc on slantboard    Other Knee/Hip Stretches piriformis stretch 2x30   Knee/Hip Exercises: Standing   Heel Raises Both;1 set;15 reps   Heel Raises Limitations heel and toe raises    Forward Lunges Both;10 reps   Forward Lunges Limitations 4" no UE's   Functional Squat 1 set;10 reps   Functional Squat Limitations cues for form, mod    Other Standing Knee Exercises eccentric sit to stands 1x10   Knee/Hip Exercises: Supine   Bridges 15 reps   Straight Leg Raises Both;1 set;15 reps   Straight Leg Raises Limitations 2#                 PT Education - 04/26/15 1844    Education provided Yes   Education Details proper squat and lunge form to reduce stress on knee during exercise    Person(s) Educated Patient   Methods Explanation   Comprehension Verbalized understanding  PT Short Term Goals - 04/21/15 2056    PT SHORT TERM GOAL #1   Title Patient to be able to complete 5x sit to stand in 9 seconds or less in order to demonstrate improved muscle power and overall mobility    Baseline 10 sec    Time 2   Period Weeks   Status On-going   PT SHORT TERM GOAL #2   Title Patient to be able to ambulate at least 1243ft during 6MWT with no rest breaks or unsteadiness in order to demonstrate readiness to return to community    Time 2   Period Weeks   Status Unable to assess   PT SHORT TERM GOAL #3   Title Patient to be independent in correctly and consistently performing appropriate HEP, to be updated PRN    Time 2   Period Weeks   Status Achieved           PT Long Term Goals - 04/21/15 2057    PT LONG TERM GOAL #1   Title Patient to demonstrate strength 5/5 in all tested muscle groups  in order to  reduce pain and improve general function at home and in community    Baseline L quad= 4+/5 MMT; L HS= 4/5 MMT    Time 4   Period Weeks   Status On-going   PT LONG TERM GOAL #2   Title Patient to be able to demonstrate proper squat form so that she can complete yardwork with correct mechanics and without injury risk to other parts of the kinetic chain    Baseline Verbal cues required for proper knee alignment and technique with squats    Time 4   Period Weeks   Status On-going   PT LONG TERM GOAL #3   Title Patient to return to regular walking program at least 4 days per week, for at least 30 minutes in duration, in order to assist in maintaining functional gains and returning to PLOF    Time 4   Period Weeks   Status On-going   PT LONG TERM GOAL #4   Title Patient will report decreased L knee pain to 0-3/10 on a VAS in order to improve tolerance with WB activities.    Baseline Created on 04/21/15   Time 5   Period Weeks   Status New               Plan - 04/26/15 1845    Clinical Impression Statement Patient arrived today voicing concern about the swelling i her knee as well as pain in her knee during functional exercises; discussed effects of edema and exercise form in relation to pain. Otherwsie performed functional stretching and strengthening with both open and closed chain exercise techniques. Patient reports "this feels good" when she was able to perform lunge correctly with PT cues, hwoever demonstrates  difficulty in maintainnig good form during lunges and squats without PT corrections and cues, which is likely aggravating her knee when she tries to do these independently. Added piriformis stretch to HEP today.    Pt will benefit from skilled therapeutic intervention in order to improve on the following deficits Decreased strength;Pain;Decreased coordination;Improper body mechanics   Rehab Potential Excellent   PT Frequency 2x / week   PT Duration Other (comment)   PT  Treatment/Interventions ADLs/Self Care Home Management;Cryotherapy;Electrical Stimulation;Gait training;Stair training;Functional mobility training;Therapeutic activities;Therapeutic exercise;Balance training;Neuromuscular re-education;Patient/family education;Manual techniques;Energy conservation;Taping;Moist Heat;Passive range of motion   PT Next Visit Plan Continue to progress closed chain  therex, working on squats within pain limits and progressing to stairs. Continue with patellar mobs and tibiofemoral joint distraction for pain reduction   PT Home Exercise Plan Reviewed HEP with no changes made this visit    Consulted and Agree with Plan of Care Patient        Problem List There are no active problems to display for this patient.   Deniece Ree PT, DPT [phone]  [facility] [address] [place], [place], [address] Phone: [phone]   Fax:  [phone]  Name: Joyce Herrera MRN: [id number] Date of Birth: 27-Oct-1960

## 2015-04-28 ENCOUNTER — Ambulatory Visit (HOSPITAL_COMMUNITY): Payer: BLUE CROSS/BLUE SHIELD | Admitting: Physical Therapy

## 2015-04-28 DIAGNOSIS — M6281 Muscle weakness (generalized): Secondary | ICD-10-CM

## 2015-04-28 DIAGNOSIS — Z9889 Other specified postprocedural states: Secondary | ICD-10-CM

## 2015-04-28 DIAGNOSIS — M25562 Pain in left knee: Secondary | ICD-10-CM

## 2015-04-28 NOTE — Therapy (Signed)
[place], [place], [address] Phone: [phone]   Fax:  [phone]  Physical Therapy Treatment  Patient Details  Name: Joyce Herrera MRN: [id number] Date of Birth: 03/16/1960 Referring Provider: Dr. Sydnee Cabal  Encounter Date: 04/28/2015      PT End of Session - 04/28/15 1430    Visit Number 7   Number of Visits 8   Date for PT Re-Evaluation 05/20/15   Authorization Type [employer] Advantage PPO (30 OT/PT/Chiro, 0 used at time of eval)    Authorization Time Period 04/21/2015 to 05/26/2015    PT Start Time 1345   PT Stop Time 1438   PT Time Calculation (min) 53 min   Activity Tolerance Patient tolerated treatment well   Behavior During Therapy [facility] for tasks assessed/performed      Past Medical History  Diagnosis Date  . Hypothyroidism   . Seasonal allergies   . History of colon polyps   . Headache(784.0)   . PONV (postoperative nausea and vomiting)     Past Surgical History  Procedure Laterality Date  . Cholecystectomy    . Abdominal hysterectomy    . Tonsillectomy    . Nasal septum surgery    . Plantar fasciectomy      bilateral  . Colonoscopy  09/14/2010    Procedure: COLONOSCOPY;  Surgeon: Rogene Houston, MD;  [facility];  Service: Endoscopy;  Laterality: N/A;    There were no vitals filed for this visit.  Visit Diagnosis:  Left knee pain  Muscle weakness  S/P left knee arthroscopy      Subjective Assessment - 04/28/15 1354    Subjective Pt states her knee is irritated today. PT states she has not done anything and doesn't know why.   states she went back to the MD Monday and he fitted her for brace and is discussing injections to help build cushion in knee joint.     Currently in Pain? Yes   Pain Score 7    Pain Location Knee   Pain Orientation Left                         [facility] Adult PT Treatment/Exercise - 04/28/15 0001    Knee/Hip Exercises:  Stretches   Active Hamstring Stretch Both;3 reps;30 seconds   Passive Hamstring Stretch 3 reps;30 seconds   Passive Hamstring Stretch Limitations gastroc on slantboard    Knee/Hip Exercises: Supine   Straight Leg Raises Both;15 reps;2 sets   Knee/Hip Exercises: Sidelying   Hip ABduction Left;15 reps;2 sets   Modalities   Modalities Electrical Stimulation;Cryotherapy   Cryotherapy   Number Minutes Cryotherapy 10 Minutes   Cryotherapy Location Knee  left   Type of Cryotherapy Ice pack   Electrical Stimulation   Electrical Stimulation Location Lt knee with elevation   Electrical Stimulation Action to decrease swelling and pain   Electrical Stimulation Parameters hi/lo sweep intensity 19 CV   Electrical Stimulation Goals Edema;Pain   Manual Therapy   Manual Therapy Soft tissue mobilization   Manual therapy comments Completed separately from ther ex   Soft tissue mobilization to medial and lateral knee to decrease edema                  PT Short Term Goals - 04/21/15 2056    PT SHORT TERM GOAL #1   Title Patient to be able to complete 5x sit to stand in 9  seconds or less in order to demonstrate improved muscle power and overall mobility    Baseline 10 sec    Time 2   Period Weeks   Status On-going   PT SHORT TERM GOAL #2   Title Patient to be able to ambulate at least 1253ft during 6MWT with no rest breaks or unsteadiness in order to demonstrate readiness to return to community    Time 2   Period Weeks   Status Unable to assess   PT SHORT TERM GOAL #3   Title Patient to be independent in correctly and consistently performing appropriate HEP, to be updated PRN    Time 2   Period Weeks   Status Achieved           PT Long Term Goals - 04/21/15 2057    PT LONG TERM GOAL #1   Title Patient to demonstrate strength 5/5 in all tested muscle groups  in order to reduce pain and improve general function at home and in community    Baseline L quad= 4+/5 MMT; L HS= 4/5  MMT    Time 4   Period Weeks   Status On-going   PT LONG TERM GOAL #2   Title Patient to be able to demonstrate proper squat form so that she can complete yardwork with correct mechanics and without injury risk to other parts of the kinetic chain    Baseline Verbal cues required for proper knee alignment and technique with squats    Time 4   Period Weeks   Status On-going   PT LONG TERM GOAL #3   Title Patient to return to regular walking program at least 4 days per week, for at least 30 minutes in duration, in order to assist in maintaining functional gains and returning to PLOF    Time 4   Period Weeks   Status On-going   PT LONG TERM GOAL #4   Title Patient will report decreased L knee pain to 0-3/10 on a VAS in order to improve tolerance with WB activities.    Baseline Created on 04/21/15   Time 5   Period Weeks   Status New               Plan - 04/28/15 1518    Clinical Impression Statement Pt with increased pain this session.  No new exercises  added with focus on pain control.  Gentle stretches completed followed by open chain therex.  Soft tissue completed to surrounding Lt knee musculature followed by addition of IFES with ice at end of session to help decrease edema and pain.  Pt  reported being painfree at end of session.  Instructed to complete icing 1-2X daily to help decrease pain and edema.     Pt will benefit from skilled therapeutic intervention in order to improve on the following deficits Decreased strength;Pain;Decreased coordination;Improper body mechanics   Rehab Potential Excellent   PT Frequency 2x / week   PT Duration Other (comment)   PT Treatment/Interventions ADLs/Self Care Home Management;Cryotherapy;Electrical Stimulation;Gait training;Stair training;Functional mobility training;Therapeutic activities;Therapeutic exercise;Balance training;Neuromuscular re-education;Patient/family education;Manual techniques;Energy conservation;Taping;Moist  Heat;Passive range of motion   PT Next Visit Plan Re-evaluate next session.   PT Home Exercise Plan Reviewed HEP with no changes made this visit    Consulted and Agree with Plan of Care Patient        Problem List There are no active problems to display for this patient.   Teena Irani, PTA/CLT [phone]  04/28/2015, 3:22  PM  [place], [place], [address] Phone: [phone]   Fax:  [phone]  Name: Joyce Herrera MRN: [id number] Date of Birth: 1960/10/03

## 2015-05-03 ENCOUNTER — Ambulatory Visit (HOSPITAL_COMMUNITY): Payer: BLUE CROSS/BLUE SHIELD | Attending: Specialist

## 2015-05-03 DIAGNOSIS — M6281 Muscle weakness (generalized): Secondary | ICD-10-CM

## 2015-05-03 DIAGNOSIS — M25562 Pain in left knee: Secondary | ICD-10-CM | POA: Insufficient documentation

## 2015-05-03 DIAGNOSIS — Z9889 Other specified postprocedural states: Secondary | ICD-10-CM | POA: Insufficient documentation

## 2015-05-03 DIAGNOSIS — R600 Localized edema: Secondary | ICD-10-CM | POA: Insufficient documentation

## 2015-05-03 NOTE — Therapy (Signed)
[place], [place], [address] Phone: [phone]   Fax:  [phone]  Physical Therapy Treatment  Patient Details  Name: Joyce Herrera MRN: [id number] Date of Birth: 1960/02/20 Referring Provider: Dr. Sydnee Cabal  Encounter Date: 05/03/2015      PT End of Session - 05/03/15 1636    Visit Number 8   Number of Visits 18   Date for PT Re-Evaluation 05/20/15   Authorization Type [employer] Advantage PPO (30 OT/PT/Chiro, 0 used at time of eval)    Authorization Time Period 04/21/2015 to 05/26/2015    PT Start Time 1348   PT Stop Time 1443   PT Time Calculation (min) 55 min   Activity Tolerance Patient tolerated treatment well   Behavior During Therapy [facility] for tasks assessed/performed      Past Medical History  Diagnosis Date  . Hypothyroidism   . Seasonal allergies   . History of colon polyps   . Headache(784.0)   . PONV (postoperative nausea and vomiting)     Past Surgical History  Procedure Laterality Date  . Cholecystectomy    . Abdominal hysterectomy    . Tonsillectomy    . Nasal septum surgery    . Plantar fasciectomy      bilateral  . Colonoscopy  09/14/2010    Procedure: COLONOSCOPY;  Surgeon: Rogene Houston, MD;  [facility];  Service: Endoscopy;  Laterality: N/A;    There were no vitals filed for this visit.  Visit Diagnosis:  Left knee pain  Muscle weakness  S/P left knee arthroscopy      Subjective Assessment - 05/03/15 1403    Subjective Pt reported good tolerance with e-stim tx completed at last PT tx with reduced L knee pain levels noted for 24 hours after. Pt c/o moderate L knee pain upon arrival that was rated a 5/10 on a VAS. Pt noted that she recently saw her surgeon and that she is scheduled to have an injection to her L knee to assist with her pain.   Pertinent History PMH generally unremarkable    Limitations Standing;Walking   How long can you sit comfortably?  unlimited    Patient Stated Goals to get back walking and reduce pain    Currently in Pain? Yes   Pain Score 5   L knee pain ranges between a 3-7/10 on a VAS   Pain Location Knee   Pain Orientation Left   Pain Descriptors / Indicators Throbbing;Aching   Pain Type Chronic pain   Pain Onset More than a month ago   Pain Frequency Intermittent   Aggravating Factors  walking and prolonged standing    Pain Relieving Factors ice, heat, sitting, and hot tub   Effect of Pain on Daily Activities limiting her ability ot ambulate long distances                 [facility] Adult PT Treatment/Exercise - 05/03/15 0001    Knee/Hip Exercises: Stretches   Passive Hamstring Stretch Left;30 seconds;4 reps   Passive Hamstring Stretch Limitations manually    Quad Stretch 30 seconds;4 reps   Quad Stretch Limitations with strap    Knee/Hip Exercises: Aerobic   Stationary Bike 6 minutes on level 1-3 prior to beginning with PT tx    Knee/Hip Exercises: Seated   Long Arc [employer] Strengthening;Left;2 sets;15 reps   Long Arc Quad Limitations red thera-band    Hamstring Curl Strengthening;Left;2 sets;15 reps   Hamstring  Limitations red thera-band    Modalities   Modalities Electrical Stimulation;Cryotherapy   Cryotherapy   Number Minutes Cryotherapy 10 Minutes   Cryotherapy Location Knee;Other (comment)  Left    Type of Cryotherapy Ice pack;Other (comment)  in conjunction with IFC e-stim tx at end of PT tx    Electrical Stimulation   Electrical Stimulation Location Lt knee with elevation   Electrical Stimulation Action to decrease L knee pain    Electrical Stimulation Parameters IFC e-stim on intensity 15 CV  in conjunction with ice pack at end of PT tx  Skin was WNL prior to and after tx   Electrical Stimulation Goals Pain   Manual Therapy   Manual Therapy Joint mobilization;Soft tissue mobilization;Passive ROM   Manual therapy comments Completed separately from ther ex   Joint Mobilization L  tibiofemoral distraction with knee flexed to 90 degrees in short sit position in order to reduce pain; patellar mobs grades I-III in S<>I and M<>L direction to reduce pain and improve mobility    Soft tissue mobilization rolling stick to HS/ITB/quad    Passive ROM L knee into flexion and extension x 15 reps            PT Education - 05/03/15 1634    Education provided Yes   Education Details Educated pt on HEP, proper use and technique with cybex knee extension machine, use of cryotherapy/moist heat therapy, and L LE elevation to reduce edema    Person(s) Educated Patient   Methods Explanation;Demonstration   Comprehension Verbalized understanding;Returned demonstration;Need further instruction          PT Short Term Goals - 04/21/15 2056    PT SHORT TERM GOAL #1   Title Patient to be able to complete 5x sit to stand in 9 seconds or less in order to demonstrate improved muscle power and overall mobility    Baseline 10 sec    Time 2   Period Weeks   Status On-going   PT SHORT TERM GOAL #2   Title Patient to be able to ambulate at least 1235ft during 6MWT with no rest breaks or unsteadiness in order to demonstrate readiness to return to community    Time 2   Period Weeks   Status Unable to assess   PT SHORT TERM GOAL #3   Title Patient to be independent in correctly and consistently performing appropriate HEP, to be updated PRN    Time 2   Period Weeks   Status Achieved           PT Long Term Goals - 04/21/15 2057    PT LONG TERM GOAL #1   Title Patient to demonstrate strength 5/5 in all tested muscle groups  in order to reduce pain and improve general function at home and in community    Baseline L quad= 4+/5 MMT; L HS= 4/5 MMT    Time 4   Period Weeks   Status On-going   PT LONG TERM GOAL #2   Title Patient to be able to demonstrate proper squat form so that she can complete yardwork with correct mechanics and without injury risk to other parts of the kinetic chain     Baseline Verbal cues required for proper knee alignment and technique with squats    Time 4   Period Weeks   Status On-going   PT LONG TERM GOAL #3   Title Patient to return to regular walking program at least 4 days per week, for at least 30  minutes in duration, in order to assist in maintaining functional gains and returning to PLOF    Time 4   Period Weeks   Status On-going   PT LONG TERM GOAL #4   Title Patient will report decreased L knee pain to 0-3/10 on a VAS in order to improve tolerance with WB activities.    Baseline Created on 04/21/15   Time 5   Period Weeks   Status New               Plan - 05/03/15 1637    Clinical Impression Statement PT tx was focused on NWB L quad/HS strengthening, L knee joint mobs/STM, and e-stim in conjunction with cold pack to manage L knee pain. Initiated PT tx with warm-up on the recumbent bike. Progressed to L quad/HS manual stretches and L knee PROM in order to maximize L knee mobility. Completed resisted LAQ and HS curls in short sit position with red thera-band with good technique and tolerance assessed. Educated pt on proper use of knee extension cybex machine in order to initiate LAQ as part of her HEP. Instructed the pt to initiate LAQ with cybex machine with light weight. Pt verbalized full understanding. Completed L tibiofemoral distraction and patellar mobs with L knee pain reduced from a 5/10 to a 2/10 on a VAS. Ended PT tx with use of IFC e-stim in conjunction with ice pack applied to the L knee in supine position with L LE slightly elevated. L knee pain was further reduced from 2/10 on a VAS to 0/10 on a VAS. Pt responded well to today's PT tx with pain fully resolved. Therapist thoroughly instructed the pt on pain and edema management strategies with good understanding verbalized per pt. Pt would benefit from continued skilled PT to address remaining deficits in order to progress towards improved tolerance with long distance  ambulation.   Pt will benefit from skilled therapeutic intervention in order to improve on the following deficits Decreased strength;Pain;Decreased coordination;Improper body mechanics   Rehab Potential Excellent   PT Frequency 2x / week   PT Duration Other (comment)  5 weeks    PT Treatment/Interventions ADLs/Self Care Home Management;Cryotherapy;Electrical Stimulation;Gait training;Stair training;Functional mobility training;Therapeutic activities;Therapeutic exercise;Balance training;Neuromuscular re-education;Patient/family education;Manual techniques;Energy conservation;Taping;Moist Heat;Passive range of motion   PT Next Visit Plan Next visit to focus on manual techniques to improve L knee mobility and reduce pain, L quad/HS strengthening to tolerance, quad/HS/ITB stretching, and E-stim with ice pack to manage pain.    PT Home Exercise Plan Reviewed HEP with no changes made this visit    Consulted and Agree with Plan of Care Patient        Problem List There are no active problems to display for this patient.   Garen Lah, PT, DPT   05/03/2015, 4:53 PM  [place] [address] [place], [place], [address] Phone: [phone]   Fax:  [phone]  Name: Joyce Herrera MRN: [id number] Date of Birth: July 28, 1960

## 2015-05-05 ENCOUNTER — Ambulatory Visit (HOSPITAL_COMMUNITY): Payer: BLUE CROSS/BLUE SHIELD | Admitting: Physical Therapy

## 2015-05-05 DIAGNOSIS — M25562 Pain in left knee: Secondary | ICD-10-CM | POA: Diagnosis not present

## 2015-05-05 DIAGNOSIS — M6281 Muscle weakness (generalized): Secondary | ICD-10-CM | POA: Diagnosis not present

## 2015-05-05 DIAGNOSIS — R600 Localized edema: Secondary | ICD-10-CM

## 2015-05-05 DIAGNOSIS — Z9889 Other specified postprocedural states: Secondary | ICD-10-CM | POA: Diagnosis not present

## 2015-05-05 NOTE — Therapy (Signed)
[place], [place], [address] Phone: [phone]   Fax:  [phone]  Physical Therapy Treatment  Patient Details  Name: Joyce Herrera MRN: [id number] Date of Birth: 01-23-1961 Referring Provider: Dr. Sydnee Cabal  Encounter Date: 05/05/2015      PT End of Session - 05/05/15 1826    Visit Number 9   Number of Visits 18   Date for PT Re-Evaluation 05/20/15   Authorization Type [employer] Advantage PPO (30 OT/PT/Chiro, 0 used at time of eval)    Authorization Time Period 04/21/2015 to 05/26/2015    PT Start Time 1649   PT Stop Time 1738   PT Time Calculation (min) 49 min   Activity Tolerance Patient tolerated treatment well   Behavior During Therapy [facility] for tasks assessed/performed      Past Medical History  Diagnosis Date  . Hypothyroidism   . Seasonal allergies   . History of colon polyps   . Headache(784.0)   . PONV (postoperative nausea and vomiting)     Past Surgical History  Procedure Laterality Date  . Cholecystectomy    . Abdominal hysterectomy    . Tonsillectomy    . Nasal septum surgery    . Plantar fasciectomy      bilateral  . Colonoscopy  09/14/2010    Procedure: COLONOSCOPY;  Surgeon: Rogene Houston, MD;  [facility];  Service: Endoscopy;  Laterality: N/A;    There were no vitals filed for this visit.      Subjective Assessment - 05/05/15 1838    Subjective Pt states the estim and ice are helping for a day or so but then comes back.  States she has been icing her knee as Gabe instructed.  States she is waiting to hear about her Euflexxa injections for her knee.    Currently in Pain? Yes   Pain Score 5    Pain Location Knee   Pain Orientation Left   Pain Descriptors / Indicators Aching                         [facility] Adult PT Treatment/Exercise - 05/05/15 0001    Knee/Hip Exercises: Aerobic   Stationary Bike 6 minutes on level 1-3 prior to beginning with  PT tx    Modalities   Modalities Electrical Stimulation;Cryotherapy   Cryotherapy   Number Minutes Cryotherapy 10 Minutes   Cryotherapy Location Knee;Other (comment)   Type of Cryotherapy Ice pack   Electrical Stimulation   Electrical Stimulation Location Lt knee with elevation   Electrical Stimulation Action to decrease lt knee pain and swelling   Electrical Stimulation Parameters IFES intensity of 17volts   Electrical Stimulation Goals Pain   Manual Therapy   Manual Therapy Taping   Manual therapy comments Completed separately from ther ex                PT Education - 05/05/15 1720    Education provided Yes   Education Details educated pt on use, precautions/indications on kinesiotape.  Instructed to use soap/water to remove properly if needed.  Instructed to remove if becomes irritated or begins to hurt.    Person(s) Educated Patient   Methods Explanation   Comprehension Verbalized understanding          PT Short Term Goals - 04/21/15 2056    PT SHORT TERM GOAL #1   Title Patient to be able to complete 5x sit to  stand in 9 seconds or less in order to demonstrate improved muscle power and overall mobility    Baseline 10 sec    Time 2   Period Weeks   Status On-going   PT SHORT TERM GOAL #2   Title Patient to be able to ambulate at least 1258ft during 6MWT with no rest breaks or unsteadiness in order to demonstrate readiness to return to community    Time 2   Period Weeks   Status Unable to assess   PT SHORT TERM GOAL #3   Title Patient to be independent in correctly and consistently performing appropriate HEP, to be updated PRN    Time 2   Period Weeks   Status Achieved           PT Long Term Goals - 04/21/15 2057    PT LONG TERM GOAL #1   Title Patient to demonstrate strength 5/5 in all tested muscle groups  in order to reduce pain and improve general function at home and in community    Baseline L quad= 4+/5 MMT; L HS= 4/5 MMT    Time 4   Period  Weeks   Status On-going   PT LONG TERM GOAL #2   Title Patient to be able to demonstrate proper squat form so that she can complete yardwork with correct mechanics and without injury risk to other parts of the kinetic chain    Baseline Verbal cues required for proper knee alignment and technique with squats    Time 4   Period Weeks   Status On-going   PT LONG TERM GOAL #3   Title Patient to return to regular walking program at least 4 days per week, for at least 30 minutes in duration, in order to assist in maintaining functional gains and returning to PLOF    Time 4   Period Weeks   Status On-going   PT LONG TERM GOAL #4   Title Patient will report decreased L knee pain to 0-3/10 on a VAS in order to improve tolerance with WB activities.    Baseline Created on 04/21/15   Time 5   Period Weeks   Status New               Plan - 05/05/15 1826    Clinical Impression Statement Pt requesting kinesiotaping of knee for swelling as mentioned by PT last session.  Pt with noted frustration in lack of lasting relief in Lt knee.  Focused session on pain relief completing estim with ice and followed by application of kinesiotape.  Pt reported comfort and without pain at end of session.   Rehab Potential Excellent   PT Frequency 2x / week   PT Duration Other (comment)  5 weeks    PT Treatment/Interventions ADLs/Self Care Home Management;Cryotherapy;Electrical Stimulation;Gait training;Stair training;Functional mobility training;Therapeutic activities;Therapeutic exercise;Balance training;Neuromuscular re-education;Patient/family education;Manual techniques;Energy conservation;Taping;Moist Heat;Passive range of motion   PT Next Visit Plan Next visit to focus on manual techniques to improve L knee mobility and reduce pain, L quad/HS strengthening to tolerance, quad/HS/ITB stretching, and E-stim with ice pack to manage pain. Assess effectiveness of taping.   PT Home Exercise Plan Reviewed HEP with  no changes made this visit    Consulted and Agree with Plan of Care Patient      Patient will benefit from skilled therapeutic intervention in order to improve the following deficits and impairments:  Decreased strength, Pain, Decreased coordination, Improper body mechanics  Visit Diagnosis: Pain in left knee  Muscle weakness (generalized)  Localized edema     Problem List There are no active problems to display for this patient.   Teena Irani, PTA/CLT [phone]  05/05/2015, 6:41 PM  [place] [address] [place], [place], [address] Phone: [phone]   Fax:  [phone]  Name: GELA ROOTH MRN: [id number] Date of Birth: 02-Apr-1960

## 2015-05-09 ENCOUNTER — Ambulatory Visit (HOSPITAL_COMMUNITY): Payer: BLUE CROSS/BLUE SHIELD

## 2015-05-09 ENCOUNTER — Encounter (HOSPITAL_COMMUNITY): Payer: Self-pay

## 2015-05-09 DIAGNOSIS — R600 Localized edema: Secondary | ICD-10-CM

## 2015-05-09 DIAGNOSIS — M25562 Pain in left knee: Secondary | ICD-10-CM

## 2015-05-09 DIAGNOSIS — M6281 Muscle weakness (generalized): Secondary | ICD-10-CM

## 2015-05-09 DIAGNOSIS — Z9889 Other specified postprocedural states: Secondary | ICD-10-CM | POA: Diagnosis not present

## 2015-05-09 NOTE — Therapy (Signed)
[place], [place], [address] Phone: [phone]   Fax:  [phone]  Physical Therapy Treatment  Patient Details  Name: Joyce Herrera MRN: [id number] Date of Birth: [birth year] Referring Provider: Dr. Sydnee Cabal  Encounter Date: 05/09/2015      PT End of Session - 05/09/15 1658    Visit Number 10   Number of Visits 18   Date for PT Re-Evaluation 05/20/15   Authorization Type [employer] Advantage PPO (30 OT/PT/Chiro, 0 used at time of eval)    Authorization Time Period 04/21/2015 to 05/26/2015    PT Start Time 1647   PT Stop Time 1733   PT Time Calculation (min) 46 min   Activity Tolerance Patient tolerated treatment well   Behavior During Therapy [facility] for tasks assessed/performed      Past Medical History  Diagnosis Date  . Hypothyroidism   . Seasonal allergies   . History of colon polyps   . Headache(784.0)   . PONV (postoperative nausea and vomiting)     Past Surgical History  Procedure Laterality Date  . Cholecystectomy    . Abdominal hysterectomy    . Tonsillectomy    . Nasal septum surgery    . Plantar fasciectomy      bilateral  . Colonoscopy  09/14/2010    Procedure: COLONOSCOPY;  Surgeon: Rogene Houston, MD;  [facility];  Service: Endoscopy;  Laterality: N/A;    There were no vitals filed for this visit.      Subjective Assessment - 05/09/15 1654    Subjective Pt reports that her L knee is tight today with L knee pain rated a 3/10 on a VAS upon arrival to PT clinic. Pt noted no significant improvements with swelling after application of Kinesiotape was completed at last PT visit. Pt admitted minimal compliance with her HEP, but noted that she recently acquired a LE ergometer that she will begin to use with permission from therapist in order to "loosen" up her knee.    Pertinent History PMH generally unremarkable    Patient Stated Goals to get back walking and reduce pain    Currently in Pain? Yes   Pain Score 3   L knee pain ranges between a 0-7/10 on a VAS   Pain Location Knee   Pain Orientation Left   Pain Descriptors / Indicators Aching   Pain Type Chronic pain   Pain Onset More than a month ago   Pain Frequency Intermittent   Aggravating Factors  walking and prolonged standing    Pain Relieving Factors ice, heat, sitting, and use of hot tub    Effect of Pain on Daily Activities limiting her ability to ambualte long distances                          [facility] Adult PT Treatment/Exercise - 05/09/15 0001    Knee/Hip Exercises: Stretches   Passive Hamstring Stretch Left;30 seconds;4 reps   Passive Hamstring Stretch Limitations manually    Quad Stretch 30 seconds;4 reps   Quad Stretch Limitations with strap    Knee/Hip Exercises: Aerobic   Stationary Bike 6 minutes on level 1-3 prior to beginning with PT tx    Knee/Hip Exercises: Standing   Terminal Knee Extension Limitations L TKE with green thera-band x 2 sets of 10 reps   Functional Squat 1 set;15 reps   Functional Squat Limitations Partial squat with PB on wall  Knee/Hip Exercises: Seated   Long Arc Quad Strengthening;Left;2 sets;15 reps   Long Arc Quad Limitations green thera-band    Hamstring Curl Strengthening;Left;2 sets;15 reps   Hamstring Limitations green thera-band    Manual Therapy   Manual Therapy Joint mobilization;Soft tissue mobilization;Passive ROM   Manual therapy comments Completed separately from ther ex   Joint Mobilization L tibiofemoral distraction with knee flexed to 90 degrees in short sit position in order to reduce pain; patellar mobs grades I-III in S<>I and M<>L direction to reduce pain and improve mobility    Soft tissue mobilization rolling stick to HS/ITB/quad    Passive ROM L knee into flexion and extension x 15 reps                 PT Education - 05/09/15 1741    Education provided Yes   Education Details HEP and use of moist  heat/cryotherapy to manage pain ( 15-20 minutes, 3-4x/day)    Person(s) Educated Patient   Methods Explanation;Demonstration   Comprehension Verbalized understanding;Returned demonstration          PT Short Term Goals - 04/21/15 2056    PT SHORT TERM GOAL #1   Title Patient to be able to complete 5x sit to stand in 9 seconds or less in order to demonstrate improved muscle power and overall mobility    Baseline 10 sec    Time 2   Period Weeks   Status On-going   PT SHORT TERM GOAL #2   Title Patient to be able to ambulate at least 1273ft during 6MWT with no rest breaks or unsteadiness in order to demonstrate readiness to return to community    Time 2   Period Weeks   Status Unable to assess   PT SHORT TERM GOAL #3   Title Patient to be independent in correctly and consistently performing appropriate HEP, to be updated PRN    Time 2   Period Weeks   Status Achieved           PT Long Term Goals - 04/21/15 2057    PT LONG TERM GOAL #1   Title Patient to demonstrate strength 5/5 in all tested muscle groups  in order to reduce pain and improve general function at home and in community    Baseline L quad= 4+/5 MMT; L HS= 4/5 MMT    Time 4   Period Weeks   Status On-going   PT LONG TERM GOAL #2   Title Patient to be able to demonstrate proper squat form so that she can complete yardwork with correct mechanics and without injury risk to other parts of the kinetic chain    Baseline Verbal cues required for proper knee alignment and technique with squats    Time 4   Period Weeks   Status On-going   PT LONG TERM GOAL #3   Title Patient to return to regular walking program at least 4 days per week, for at least 30 minutes in duration, in order to assist in maintaining functional gains and returning to PLOF    Time 4   Period Weeks   Status On-going   PT LONG TERM GOAL #4   Title Patient will report decreased L knee pain to 0-3/10 on a VAS in order to improve tolerance with WB  activities.    Baseline Created on 04/21/15   Time 5   Period Weeks   Status New  Plan - 05/09/15 1659    Clinical Impression Statement PT tx was focused on manual therapy techniques to improve joint mobility and reduce pain of the L knee, quad/HS strengthening, and LE stretches. Pt tolerated tx well, but c/o minor increase in L knee pain after completing partial wall ball squats. L knee pain returned to baseline level after squats were discontinued. Strengthening ther ex were completed with ther-band in order to improve tolerance. Less stiffness of the L knee was reported after manual techniques were completed. Minor L knee edema assessed with a difference of 0.5 cm measured when compared bilaterally. Skin was WNL after recent use of Kinesiotape. Kinesiotape was held this visit. L knee pain was reduced to 0/10 on a VAS after manual therapy and ROM ther ex were completed. Pt is making slow, but steady progress towards stated goals with less frequent L knee pain reported. Pt continues to be limited with WB ther ex and activities. Pt would benefit from skilled PT to further improve L quad/HS strength, reduce pain levels, and improve LE flexibility in order to improve tolerance and performance with WB activities such as walking, standing, and squatting.    Rehab Potential Excellent   PT Frequency 2x / week   PT Duration 3 weeks   PT Treatment/Interventions ADLs/Self Care Home Management;Cryotherapy;Electrical Stimulation;Gait training;Stair training;Functional mobility training;Therapeutic activities;Therapeutic exercise;Balance training;Neuromuscular re-education;Patient/family education;Manual techniques;Energy conservation;Taping;Moist Heat;Passive range of motion   PT Next Visit Plan Next visit to focus on manual techniques to improve L knee mobility and reduce pain, L quad/HS strengthening to tolerance, quad/HS/ITB stretching, and E-stim with ice pack to manage pain, prn   PT Home  Exercise Plan Reviewed HEP with no changes made this visit; Plan to update HEP at next PT visit    Consulted and Agree with Plan of Care Patient      Patient will benefit from skilled therapeutic intervention in order to improve the following deficits and impairments:  Decreased strength, Pain, Decreased coordination, Improper body mechanics  Visit Diagnosis: Pain in left knee  Muscle weakness (generalized)  Localized edema     Problem List There are no active problems to display for this patient.   Garen Lah, PT, DPT  05/09/2015, 5:59 PM  [place] [address] [place], [place], [address] Phone: [phone]   Fax:  [phone]  Name: GLORISTINE KOSTA MRN: [id number] Date of Birth: 02/27/1960

## 2015-05-12 ENCOUNTER — Ambulatory Visit (HOSPITAL_COMMUNITY): Payer: BLUE CROSS/BLUE SHIELD

## 2015-05-12 DIAGNOSIS — R600 Localized edema: Secondary | ICD-10-CM

## 2015-05-12 DIAGNOSIS — M25562 Pain in left knee: Secondary | ICD-10-CM | POA: Diagnosis not present

## 2015-05-12 DIAGNOSIS — M6281 Muscle weakness (generalized): Secondary | ICD-10-CM | POA: Diagnosis not present

## 2015-05-12 DIAGNOSIS — Z9889 Other specified postprocedural states: Secondary | ICD-10-CM | POA: Diagnosis not present

## 2015-05-12 NOTE — Therapy (Signed)
[place], [place], [address] Phone: [phone]   Fax:  [phone]  Physical Therapy Treatment  Patient Details  Name: Joyce Herrera MRN: [id number] Date of Birth: 02-10-1960 Referring Provider: Dr. Sydnee Cabal  Encounter Date: 05/12/2015      PT End of Session - 05/12/15 1309    Visit Number 11   Number of Visits 18   Date for PT Re-Evaluation 05/20/15   Authorization Type [employer] Advantage PPO (30 OT/PT/Chiro, 0 used at time of eval)    Authorization Time Period 04/21/2015 to 05/26/2015    PT Start Time 1301   PT Stop Time 1350   PT Time Calculation (min) 49 min   Activity Tolerance Patient tolerated treatment well   Behavior During Therapy [facility] for tasks assessed/performed      Past Medical History  Diagnosis Date  . Hypothyroidism   . Seasonal allergies   . History of colon polyps   . Headache(784.0)   . PONV (postoperative nausea and vomiting)     Past Surgical History  Procedure Laterality Date  . Cholecystectomy    . Abdominal hysterectomy    . Tonsillectomy    . Nasal septum surgery    . Plantar fasciectomy      bilateral  . Colonoscopy  09/14/2010    Procedure: COLONOSCOPY;  Surgeon: Rogene Houston, MD;  [facility];  Service: Endoscopy;  Laterality: N/A;    There were no vitals filed for this visit.      Subjective Assessment - 05/12/15 1304    Subjective Patient noted that her L knee pain is minor that was rated a 2/10 on a VAS upon arrival. Patient reported that she has been compliant with her HEP.    Pertinent History PMH generally unremarkable    Limitations Standing;Walking   Patient Stated Goals to get back walking and reduce pain    Currently in Pain? Yes   Pain Score 2    Pain Location Knee   Pain Orientation Left   Pain Descriptors / Indicators Aching   Pain Type Chronic pain   Pain Onset More than a month ago   Pain Frequency Intermittent   Aggravating  Factors  walking and prolonged standing    Pain Relieving Factors ice, heat, sitting, and use of hot tub    Effect of Pain on Daily Activities limiting her ability to ambulate long distances                          [facility] Adult PT Treatment/Exercise - 05/12/15 0001    Knee/Hip Exercises: Stretches   Passive Hamstring Stretch Left;30 seconds;4 reps   Sports administrator 30 seconds;4 reps   Sports administrator Limitations with strap    Knee/Hip Exercises: Aerobic   Stationary Bike 6 minutes on level 1-3 prior to beginning with PT tx    Knee/Hip Exercises: Machines for Strengthening   Total Gym Leg Press Level 33; 2 sets of 15 reps  verbal cues provided for proper knee alignment    Knee/Hip Exercises: Seated   Long Arc Quad Strengthening;Left;3 sets;10 reps   Long Arc Quad Limitations blue thera-band    Hamstring Curl Strengthening;Left;2 sets;15 reps   Hamstring Limitations blue thera-band    Knee/Hip Exercises: Supine   Bridges with [employer] Strengthening;Both;2 sets;15 reps   Manual Therapy   Manual Therapy Joint mobilization;Soft tissue mobilization;Passive ROM   Manual therapy comments Completed separately  from ther ex   Joint Mobilization L tibiofemoral distraction with knee flexed to 90 degrees in short sit position in order to reduce pain;  Patellar mobs grades I-III in S<>I and M<>L direction to reduce pain and improve mobility    Passive ROM L knee into flexion and extension x 15 reps            PT Education - 05/12/15 1309    Education provided Yes   Education Details Updated HEP   Person(s) Educated Patient   Methods Handout;Explanation;Demonstration   Comprehension Verbalized understanding;Returned demonstration          PT Short Term Goals - 04/21/15 2056    PT SHORT TERM GOAL #1   Title Patient to be able to complete 5x sit to stand in 9 seconds or less in order to demonstrate improved muscle power and overall mobility    Baseline 10 sec    Time 2    Period Weeks   Status On-going   PT SHORT TERM GOAL #2   Title Patient to be able to ambulate at least 1271ft during 6MWT with no rest breaks or unsteadiness in order to demonstrate readiness to return to community    Time 2   Period Weeks   Status Unable to assess   PT SHORT TERM GOAL #3   Title Patient to be independent in correctly and consistently performing appropriate HEP, to be updated PRN    Time 2   Period Weeks   Status Achieved           PT Long Term Goals - 04/21/15 2057    PT LONG TERM GOAL #1   Title Patient to demonstrate strength 5/5 in all tested muscle groups  in order to reduce pain and improve general function at home and in community    Baseline L quad= 4+/5 MMT; L HS= 4/5 MMT    Time 4   Period Weeks   Status On-going   PT LONG TERM GOAL #2   Title Patient to be able to demonstrate proper squat form so that she can complete yardwork with correct mechanics and without injury risk to other parts of the kinetic chain    Baseline Verbal cues required for proper knee alignment and technique with squats    Time 4   Period Weeks   Status On-going   PT LONG TERM GOAL #3   Title Patient to return to regular walking program at least 4 days per week, for at least 30 minutes in duration, in order to assist in maintaining functional gains and returning to PLOF    Time 4   Period Weeks   Status On-going   PT LONG TERM GOAL #4   Title Patient will report decreased L knee pain to 0-3/10 on a VAS in order to improve tolerance with WB activities.    Baseline Created on 04/21/15   Time 5   Period Weeks   Status New               Plan - 05/12/15 1309    Clinical Impression Statement PT tx was focused on manual therapy techniques to improve joint mobility and reduce pain of the L knee, OKC/CKC quad/HS strengthening, and LE stretches. Progressed resistance with LAQ and HS curls from green to blue thera-band with excellent tolerance reported. Completed modified  squat on Total gym/Power Tower machine with improved tolerance reported. L knee pain was reduced to a 0/10 on a VAS at the completion  of PT tx. Pt is making steady progress towards stated goals with less frequent L knee pain and improved tolerance with resisted ther ex. HEP was reviewed and updated with new handout provided ( SLR into flexion/ext/abd, bridges, heel raises, quad stretch, and HS stretch) with good understanding verbalized and demo by the pt with teach back method used. Pt would benefit from skilled PT to further improve L quad/HS strength, reduce pain levels, and improve LE flexibility in order to improve tolerance and performance with WB activities such as walking, standing, and squatting.     Rehab Potential Excellent   PT Frequency 2x / week   PT Duration 3 weeks   PT Treatment/Interventions ADLs/Self Care Home Management;Cryotherapy;Electrical Stimulation;Gait training;Stair training;Functional mobility training;Therapeutic activities;Therapeutic exercise;Balance training;Neuromuscular re-education;Patient/family education;Manual techniques;Energy conservation;Taping;Moist Heat;Passive range of motion   PT Next Visit Plan Next visit to focus on manual techniques to improve L knee mobility and reduce pain, L quad/HS strengthening to tolerance, quad/HS/ITB stretching, and E-stim with ice pack to manage pain, prn   PT Home Exercise Plan Reviewed and updated HEP  ( SLR into flexion/ext/abd, bridges, heel raises, quad stretch, and HS stretch)    Consulted and Agree with Plan of Care Patient      Patient will benefit from skilled therapeutic intervention in order to improve the following deficits and impairments:  Decreased strength, Pain, Decreased coordination, Improper body mechanics  Visit Diagnosis: Pain in left knee  Muscle weakness (generalized)  Localized edema     Problem List There are no active problems to display for this patient.   Garen Lah, PT, DPT     05/12/2015, 2:00 PM  [place], [place], [address] Phone: [phone]   Fax:  [phone]  Name: VENESIA CHREST MRN: [id number] Date of Birth: [birth year]

## 2015-05-16 ENCOUNTER — Ambulatory Visit (HOSPITAL_COMMUNITY): Payer: BLUE CROSS/BLUE SHIELD | Admitting: Physical Therapy

## 2015-05-16 DIAGNOSIS — R600 Localized edema: Secondary | ICD-10-CM | POA: Diagnosis not present

## 2015-05-16 DIAGNOSIS — M6281 Muscle weakness (generalized): Secondary | ICD-10-CM

## 2015-05-16 DIAGNOSIS — Z9889 Other specified postprocedural states: Secondary | ICD-10-CM | POA: Diagnosis not present

## 2015-05-16 DIAGNOSIS — M25562 Pain in left knee: Secondary | ICD-10-CM | POA: Diagnosis not present

## 2015-05-16 NOTE — Therapy (Addendum)
[place], [place], [address] Phone: [phone]   Fax:  [phone]  Physical Therapy Treatment  Patient Details  Name: Joyce Herrera MRN: [id number] Date of Birth: 1960-08-22 Referring Provider: Dr. Sydnee Cabal  Encounter Date: 05/16/2015      PT End of Session - 05/16/15 1416    Visit Number 12   Number of Visits 12   Date for PT Re-Evaluation 05/20/15   Authorization Type [employer] Advantage PPO (30 OT/PT/Chiro, 0 used at time of eval)    Authorization Time Period 04/21/2015 to 05/26/2015    PT Start Time 1258   PT Stop Time 1350   PT Time Calculation (min) 52 min   Activity Tolerance Patient tolerated treatment well   Behavior During Therapy [facility] for tasks assessed/performed      Past Medical History  Diagnosis Date  . Hypothyroidism   . Seasonal allergies   . History of colon polyps   . Headache(784.0)   . PONV (postoperative nausea and vomiting)     Past Surgical History  Procedure Laterality Date  . Cholecystectomy    . Abdominal hysterectomy    . Tonsillectomy    . Nasal septum surgery    . Plantar fasciectomy      bilateral  . Colonoscopy  09/14/2010    Procedure: COLONOSCOPY;  Surgeon: Rogene Houston, MD;  [facility];  Service: Endoscopy;  Laterality: N/A;    There were no vitals filed for this visit.      Subjective Assessment - 05/16/15 1310    Subjective Pt states she is having some pain and feels it will never all go away.  3/10 on VAS upon arrival.     Currently in Pain? Yes   Pain Score 3    Pain Location Knee   Pain Orientation Left   Pain Descriptors / Indicators Aching                         [facility] Adult PT Treatment/Exercise - 05/16/15 0001    Knee/Hip Exercises: Stretches   Passive Hamstring Stretch Left;30 seconds;4 reps   Passive Hamstring Stretch Limitations manually    Quad Stretch 30 seconds;4 reps   Quad Stretch Limitations with  strap    Knee/Hip Exercises: Aerobic   Stationary Bike 10 minutes on level 1-3 prior to beginning with PT tx    Knee/Hip Exercises: Machines for Strengthening   Total Gym Leg Press Level 33; 2 sets of 15 reps   Knee/Hip Exercises: Standing   Functional Squat 1 set;15 reps   Functional Squat Limitations Partial squat with PB on wall    Knee/Hip Exercises: Supine   Bridges with [employer] Strengthening;Both;2 sets;15 reps   Straight Leg Raises Both;15 reps;2 sets   Knee/Hip Exercises: Sidelying   Hip ABduction Left;15 reps;2 sets   Manual Therapy   Manual Therapy Soft tissue mobilization;Passive ROM;Myofascial release   Manual therapy comments Completed separately from ther ex   Joint Mobilization --   Soft tissue mobilization rolling stick to ITB/quad    Myofascial Release inferior medial and lateral knee to decrease scar tissue                  PT Short Term Goals - 04/21/15 2056    PT SHORT TERM GOAL #1   Title Patient to be able to complete 5x sit to stand in 9 seconds or less in order to demonstrate  improved muscle power and overall mobility    Baseline 10 sec    Time 2   Period Weeks   Status On-going   PT SHORT TERM GOAL #2   Title Patient to be able to ambulate at least 1257f during 6MWT with no rest breaks or unsteadiness in order to demonstrate readiness to return to community    Time 2   Period Weeks   Status Unable to assess   PT SHORT TERM GOAL #3   Title Patient to be independent in correctly and consistently performing appropriate HEP, to be updated PRN    Time 2   Period Weeks   Status Achieved           PT Long Term Goals - 04/21/15 2057    PT LONG TERM GOAL #1   Title Patient to demonstrate strength 5/5 in all tested muscle groups  in order to reduce pain and improve general function at home and in community    Baseline L quad= 4+/5 MMT; L HS= 4/5 MMT    Time 4   Period Weeks   Status On-going   PT LONG TERM GOAL #2   Title Patient to  be able to demonstrate proper squat form so that she can complete yardwork with correct mechanics and without injury risk to other parts of the kinetic chain    Baseline Verbal cues required for proper knee alignment and technique with squats    Time 4   Period Weeks   Status On-going   PT LONG TERM GOAL #3   Title Patient to return to regular walking program at least 4 days per week, for at least 30 minutes in duration, in order to assist in maintaining functional gains and returning to PLOF    Time 4   Period Weeks   Status On-going   PT LONG TERM GOAL #4   Title Patient will report decreased L knee pain to 0-3/10 on a VAS in order to improve tolerance with WB activities.    Baseline Created on 04/21/15   Time 5   Period Weeks   Status New               Plan - 05/16/15 1421    Clinical Impression Statement Continued with established therex to increase strength and joint mobility and manual to help reduce pain.  No pain reported during session today.  Noted tenderness and adhesions inferior knee, both medial and lateral sites (where scar sites are).  Encouraged patient to massage these areas to reduce scar tissue and discomfort.  Pt with no reported lasting relief from techniques thus far.  continues to have lingering nagging pain in the same areas.  Evaluating PT to see patient next session with suggestion of trying laser vs return to MD.     Rehab Potential Excellent   PT Frequency 2x / week   PT Duration 3 weeks   PT Treatment/Interventions ADLs/Self Care Home Management;Cryotherapy;Electrical Stimulation;Gait training;Stair training;Functional mobility training;Therapeutic activities;Therapeutic exercise;Balance training;Neuromuscular re-education;Patient/family education;Manual techniques;Energy conservation;Taping;Moist Heat;Passive range of motion   PT Next Visit Plan Evaluating PT to see patient next session.  No lasting improvement, suggesting trial of laser to reduce scar  tissue/pain or return to MD at this point.     Consulted and Agree with Plan of Care Patient      Patient will benefit from skilled therapeutic intervention in order to improve the following deficits and impairments:  Decreased strength, Pain, Decreased coordination, Improper body mechanics  Visit  Diagnosis: Pain in left knee  Muscle weakness (generalized)     Problem List There are no active problems to display for this patient.   Teena Irani, PTA/CLT [phone]  05/16/2015, 2:27 PM  [place], [place], [address] Phone: [phone]   Fax:  [phone]  Name: Joyce Herrera MRN: [id number] Date of Birth: 07-Sep-1960  02/22/2017  PHYSICAL THERAPY DISCHARGE SUMMARY  Visits from Start of Care: 12  Current functional level related to goals / functional outcomes: See above   Remaining deficits: pain   Education / Equipment: HEP Plan: Patient agrees to discharge.  Patient goals were partially met. Patient is being discharged due to not returning since the last visit.  ?????        Rayetta Humphrey, [place] CLT [phone]

## 2015-05-18 ENCOUNTER — Ambulatory Visit (HOSPITAL_COMMUNITY): Payer: BLUE CROSS/BLUE SHIELD | Admitting: Physical Therapy

## 2015-05-18 ENCOUNTER — Telehealth (HOSPITAL_COMMUNITY): Payer: Self-pay

## 2015-05-18 NOTE — Telephone Encounter (Signed)
05/18/15 cx - twisted her ankle yesterday and no way she can do therapy... Going to her dr.

## 2015-05-19 DIAGNOSIS — S83242D Other tear of medial meniscus, current injury, left knee, subsequent encounter: Secondary | ICD-10-CM | POA: Diagnosis not present

## 2015-05-19 DIAGNOSIS — M1712 Unilateral primary osteoarthritis, left knee: Secondary | ICD-10-CM | POA: Diagnosis not present

## 2015-05-23 ENCOUNTER — Ambulatory Visit (HOSPITAL_COMMUNITY): Payer: BLUE CROSS/BLUE SHIELD | Admitting: Physical Therapy

## 2015-05-23 ENCOUNTER — Telehealth (HOSPITAL_COMMUNITY): Payer: Self-pay

## 2015-05-23 NOTE — Telephone Encounter (Signed)
MD wants patient to cancel her appts until she see him then he will decide if she needs anymore PT

## 2015-05-24 ENCOUNTER — Ambulatory Visit (HOSPITAL_COMMUNITY): Payer: BLUE CROSS/BLUE SHIELD | Admitting: Physical Therapy

## 2015-05-26 ENCOUNTER — Encounter (HOSPITAL_COMMUNITY): Payer: BLUE CROSS/BLUE SHIELD | Admitting: Physical Therapy

## 2015-05-26 DIAGNOSIS — M1712 Unilateral primary osteoarthritis, left knee: Secondary | ICD-10-CM | POA: Diagnosis not present

## 2015-06-02 DIAGNOSIS — M1712 Unilateral primary osteoarthritis, left knee: Secondary | ICD-10-CM | POA: Diagnosis not present

## 2015-06-07 ENCOUNTER — Telehealth: Payer: Self-pay | Admitting: *Deleted

## 2015-06-07 DIAGNOSIS — R062 Wheezing: Secondary | ICD-10-CM | POA: Diagnosis not present

## 2015-06-07 DIAGNOSIS — R0600 Dyspnea, unspecified: Secondary | ICD-10-CM | POA: Diagnosis not present

## 2015-06-07 DIAGNOSIS — Z1389 Encounter for screening for other disorder: Secondary | ICD-10-CM | POA: Diagnosis not present

## 2015-06-07 DIAGNOSIS — Z6829 Body mass index (BMI) 29.0-29.9, adult: Secondary | ICD-10-CM | POA: Diagnosis not present

## 2015-06-07 DIAGNOSIS — J45901 Unspecified asthma with (acute) exacerbation: Secondary | ICD-10-CM | POA: Diagnosis not present

## 2015-06-07 MED ORDER — PREGABALIN 300 MG PO CAPS: 300.0000 mg | ORAL_CAPSULE | Freq: Two times a day (BID) | ORAL | Status: DC

## 2015-06-07 NOTE — Telephone Encounter (Signed)
Received fax request for Lyrica 300mg  #600 one capsule bid, +5refill.  Dr. Paulla Dolly states refill as previously.  Faxed to [facility].

## 2015-07-05 DIAGNOSIS — J301 Allergic rhinitis due to pollen: Secondary | ICD-10-CM | POA: Diagnosis not present

## 2015-07-05 DIAGNOSIS — Z6829 Body mass index (BMI) 29.0-29.9, adult: Secondary | ICD-10-CM | POA: Diagnosis not present

## 2015-07-05 DIAGNOSIS — J209 Acute bronchitis, unspecified: Secondary | ICD-10-CM | POA: Diagnosis not present

## 2015-07-05 DIAGNOSIS — J019 Acute sinusitis, unspecified: Secondary | ICD-10-CM | POA: Diagnosis not present

## 2015-07-05 DIAGNOSIS — Z1389 Encounter for screening for other disorder: Secondary | ICD-10-CM | POA: Diagnosis not present

## 2015-07-06 DIAGNOSIS — M1712 Unilateral primary osteoarthritis, left knee: Secondary | ICD-10-CM | POA: Diagnosis not present

## 2015-07-19 ENCOUNTER — Telehealth: Payer: Self-pay | Admitting: *Deleted

## 2015-07-19 NOTE — Telephone Encounter (Signed)
Pt states she cancelled her cortisone injection appt and still wants to continue seeing Dr. Paulla Dolly and not be released.  How does she stay with the practice?

## 2015-07-25 ENCOUNTER — Other Ambulatory Visit (HOSPITAL_COMMUNITY): Payer: Self-pay | Admitting: Internal Medicine

## 2015-07-25 DIAGNOSIS — R05 Cough: Secondary | ICD-10-CM | POA: Diagnosis not present

## 2015-07-25 DIAGNOSIS — K219 Gastro-esophageal reflux disease without esophagitis: Secondary | ICD-10-CM | POA: Diagnosis not present

## 2015-07-25 DIAGNOSIS — J371 Chronic laryngotracheitis: Secondary | ICD-10-CM | POA: Diagnosis not present

## 2015-07-25 DIAGNOSIS — Z6829 Body mass index (BMI) 29.0-29.9, adult: Secondary | ICD-10-CM | POA: Diagnosis not present

## 2015-07-26 ENCOUNTER — Other Ambulatory Visit (HOSPITAL_COMMUNITY): Payer: Self-pay | Admitting: Internal Medicine

## 2015-07-26 DIAGNOSIS — R1310 Dysphagia, unspecified: Secondary | ICD-10-CM

## 2015-08-01 ENCOUNTER — Ambulatory Visit (HOSPITAL_COMMUNITY): Payer: BLUE CROSS/BLUE SHIELD

## 2015-08-04 ENCOUNTER — Ambulatory Visit: Payer: BLUE CROSS/BLUE SHIELD | Admitting: Podiatry

## 2015-08-05 ENCOUNTER — Ambulatory Visit: Payer: BLUE CROSS/BLUE SHIELD | Admitting: Podiatry

## 2015-08-10 DIAGNOSIS — E663 Overweight: Secondary | ICD-10-CM | POA: Diagnosis not present

## 2015-08-10 DIAGNOSIS — E079 Disorder of thyroid, unspecified: Secondary | ICD-10-CM | POA: Diagnosis not present

## 2015-08-10 LAB — LIPID PANEL
CHOLESTEROL: 279 mg/dL — AB (ref 0–200)
HDL: 112 mg/dL — AB (ref 35–70)
LDL CALC: 144 mg/dL
Triglycerides: 116 mg/dL (ref 40–160)

## 2015-08-10 LAB — HEMOGLOBIN A1C: Hemoglobin A1C: 5.6

## 2015-08-10 LAB — TSH: TSH: 3.64 u[IU]/mL (ref <=5.90)

## 2015-08-22 DIAGNOSIS — M25561 Pain in right knee: Secondary | ICD-10-CM | POA: Diagnosis not present

## 2015-08-24 ENCOUNTER — Ambulatory Visit (INDEPENDENT_AMBULATORY_CARE_PROVIDER_SITE_OTHER): Payer: BLUE CROSS/BLUE SHIELD | Admitting: "Endocrinology

## 2015-08-24 ENCOUNTER — Other Ambulatory Visit (HOSPITAL_COMMUNITY): Payer: Self-pay | Admitting: Internal Medicine

## 2015-08-24 ENCOUNTER — Encounter: Payer: Self-pay | Admitting: "Endocrinology

## 2015-08-24 VITALS — BP 140/84 | HR 98 | Resp 18 | Ht 63.0 in | Wt 174.0 lb

## 2015-08-24 DIAGNOSIS — E785 Hyperlipidemia, unspecified: Secondary | ICD-10-CM | POA: Diagnosis not present

## 2015-08-24 DIAGNOSIS — R05 Cough: Secondary | ICD-10-CM

## 2015-08-24 DIAGNOSIS — R059 Cough, unspecified: Secondary | ICD-10-CM

## 2015-08-24 DIAGNOSIS — E0590 Thyrotoxicosis, unspecified without thyrotoxic crisis or storm: Secondary | ICD-10-CM | POA: Diagnosis not present

## 2015-08-24 MED ORDER — PROPRANOLOL HCL 40 MG PO TABS: 20.0000 mg | ORAL_TABLET | Freq: Every day | ORAL | 3 refills | Status: DC

## 2015-08-24 NOTE — Progress Notes (Signed)
Subjective:    Patient ID: Joyce Herrera, female    DOB: [birth year], PCP Collene Mares, PA-C   Past Medical History:  Diagnosis Date  . Headache(784.0)   . History of colon polyps   . Hypothyroidism   . PONV (postoperative nausea and vomiting)   . Seasonal allergies    Past Surgical History:  Procedure Laterality Date  . ABDOMINAL HYSTERECTOMY    . CHOLECYSTECTOMY    . COLONOSCOPY  09/14/2010   Procedure: COLONOSCOPY;  Surgeon: Rogene Houston, MD;  [facility];  Service: Endoscopy;  Laterality: N/A;  . NASAL SEPTUM SURGERY    . PLANTAR FASCIECTOMY     bilateral  . TONSILLECTOMY     Social History   Social History  . Marital status: Married    Spouse name: N/A  . Number of children: N/A  . Years of education: N/A   Social History Main Topics  . Smoking status: Never Smoker  . Smokeless tobacco: Never Used  . Alcohol use No  . Drug use: No  . Sexual activity: Not Asked   Other Topics Concern  . None   Social History Narrative  . None   Outpatient Encounter Prescriptions as of 08/24/2015  Medication Sig  . ALPRAZolam (XANAX) 0.5 MG tablet   . aspirin 81 MG tablet Take 81 mg by mouth daily. Reported on 03/23/2015  . Azelastine HCl 0.15 % SOLN   . butalbital-acetaminophen-caffeine (FIORICET, ESGIC) 50-325-40 MG per tablet Reported on 03/23/2015  . calcium carbonate (OS-CAL) 600 MG TABS Take 600 mg by mouth 2 (two) times daily with a meal.    . ibuprofen (ADVIL,MOTRIN) 200 MG tablet Take 200 mg by mouth every 6 (six) hours as needed. Reported on 03/23/2015  . meloxicam (MOBIC) 15 MG tablet Take 15 mg by mouth daily.   . pantoprazole (PROTONIX) 40 MG tablet Take 40 mg by mouth daily.  . pregabalin (LYRICA) 300 MG capsule Take 1 capsule (300 mg total) by mouth 2 (two) times daily.  . propranolol (INDERAL) 40 MG tablet Take 0.5 tablets (20 mg total) by mouth daily.  . [DISCONTINUED] cetirizine (ZYRTEC) 10 MG tablet Take 10 mg by mouth daily as needed.  Reported on 03/23/2015  . [DISCONTINUED] citalopram (CELEXA) 20 MG tablet Take 20 mg by mouth daily. Reported on 03/23/2015  . [DISCONTINUED] estradiol (ESTRACE) 1 MG tablet Take 1 mg by mouth daily. Reported on 03/23/2015  . [DISCONTINUED] guaiFENesin (MUCINEX) 600 MG 12 hr tablet Take by mouth 2 (two) times daily.  . [DISCONTINUED] levothyroxine (SYNTHROID, LEVOTHROID) 25 MCG tablet Take 25 mcg by mouth daily. Reported on 03/23/2015  . [DISCONTINUED] loratadine (CLARITIN) 10 MG tablet Take 10 mg by mouth daily as needed. Reported on 03/23/2015  . [DISCONTINUED] methimazole (TAPAZOLE) 5 MG tablet Reported on 03/23/2015  . [DISCONTINUED] montelukast (SINGULAIR) 10 MG tablet Take 10 mg by mouth at bedtime.  . [DISCONTINUED] propranolol (INDERAL) 40 MG tablet 20 mg.   . [DISCONTINUED] zolpidem (AMBIEN) 10 MG tablet Reported on 03/23/2015  . [DISCONTINUED] levofloxacin (LEVAQUIN) 500 MG tablet Reported on 03/23/2015  . [DISCONTINUED] predniSONE (DELTASONE) 10 MG tablet 12 day tapering dose (Patient not taking: Reported on 03/23/2015)  . [DISCONTINUED] pregabalin (LYRICA) 300 MG capsule Take 1 capsule (300 mg total) by mouth 2 (two) times daily.   Facility-Administered Encounter Medications as of 08/24/2015  Medication  . triamcinolone acetonide (KENALOG) 10 MG/ML injection 10 mg  . triamcinolone acetonide (KENALOG) 10 MG/ML injection 10 mg  .  triamcinolone acetonide (KENALOG) 10 MG/ML injection 10 mg   ALLERGIES: Allergies  Allergen Reactions  . Penicillins Rash   VACCINATION STATUS:  There is no immunization history on file for this patient.  HPI  55 yr old female With medical history as above. Her history includes history of mild hyperthyroidism which required brief therapy with methimazole prior to her last visit. She is currently not on antithyroid medications. She is on propranolol 20 mg by mouth daily for symptomatic control. She disappeared from care for more than a year and half. During her  last visit, she was started on Qsymia for weight control , discontinued medication for unclear reasons after she lost 19 pounds.  -She has no new complaints today.  She feels better. Her TFTs indicate WNL. she denies any family hx of thyroid diseases.   Review of Systems Constitutional: + weight gain, + fatigue, no subjective hyperthermia/hypothermia Eyes: no blurry vision, no xerophthalmia ENT: no sore throat, no nodules palpated in throat, no dysphagia/odynophagia, no hoarseness Cardiovascular: no CP/SOB/palpitations/leg swelling Respiratory: no cough/SOB Gastrointestinal: no N/V/D/C Musculoskeletal: + Bilateral knee pain on analgesics Skin: no rashes Neurological: no tremors/numbness/tingling/dizziness Psychiatric: no depression/anxiety  Objective:    BP 140/84 (BP Location: Left Arm, Patient Position: Sitting, Cuff Size: Small)   Pulse 98   Resp 18   Ht 5\' 3"  (1.6 m)   Wt 174 lb (78.9 kg)   LMP 01/30/1992   SpO2 100%   BMI 30.82 kg/m   Wt Readings from Last 3 Encounters:  08/24/15 174 lb (78.9 kg)  11/10/13 162 lb (73.5 kg)  09/14/10 155 lb (70.3 kg)    Physical Exam  .Constitutional: overweight, in NAD Eyes: PERRLA, EOMI, no exophthalmos ENT: moist mucous membranes, no thyromegaly, no cervical lymphadenopathy Cardiovascular: RRR, No MRG Respiratory: CTA B Gastrointestinal: abdomen soft, NT, ND, BS+ Musculoskeletal: She wears bilateral knee braces , strength intact in all 4 Skin: moist, warm, no rashes Neurological: no tremor with outstretched hands, DTR normal in all 4  Recent Results (from the past 2160 hour(s))  Lipid panel     Status: Abnormal   Collection Time: 08/10/15 12:00 AM  Result Value Ref Range   Triglycerides 116 40 - 160 mg/dL   Cholesterol 279 (A) 0 - 200 mg/dL   HDL 112 (A) 35 - 70 mg/dL   LDL Cholesterol 144 mg/dL  Hemoglobin A1c     Status: None   Collection Time: 08/10/15 12:00 AM  Result Value Ref Range   Hemoglobin A1C 5.6   TSH      Status: None   Collection Time: 08/10/15 12:00 AM  Result Value Ref Range   TSH 3.64 0.41 - 5.90 uIU/mL    Comment: free t4 1.2     She did have thyroid ultrasound in 2012 which showed subcentimeter nodules which were stable over a period of time.  Assessment & Plan:   1. Subclinical hyperthyroidism - Resolved,  she will not require antithyroid therapy for now. Her TSH is 3.64 along with free T4 1.2. -I advised her to continue low dose propranolol 20 mg by mouth daily given her mild tachycardia.  2. Hyperlipidemia - Her HDL is excellent at 112 along with LDL high at 144. She will not require antilipid therapy at this point. -She is not interested in resuming weight loss medication, qsymia.   - I advised patient to maintain close follow up with Collene Mares, PA-C for primary care needs. Follow up plan: Return in about 6 months (around 02/24/2016)  for follow up with pre-visit labs.  Glade Lloyd, MD Phone: [phone]  Fax: [phone]   08/24/2015, 2:52 PM

## 2015-08-25 ENCOUNTER — Encounter (INDEPENDENT_AMBULATORY_CARE_PROVIDER_SITE_OTHER): Payer: Self-pay | Admitting: *Deleted

## 2015-09-05 ENCOUNTER — Ambulatory Visit (HOSPITAL_COMMUNITY)
Admission: RE | Admit: 2015-09-05 | Discharge: 2015-09-05 | Disposition: A | Discharge status: Home / Self Care | Payer: BLUE CROSS/BLUE SHIELD | Source: Ambulatory Visit | Attending: Internal Medicine | Admitting: Internal Medicine

## 2015-09-05 ENCOUNTER — Encounter: Payer: Self-pay | Admitting: "Endocrinology

## 2015-09-05 DIAGNOSIS — R1310 Dysphagia, unspecified: Secondary | ICD-10-CM | POA: Insufficient documentation

## 2015-09-05 DIAGNOSIS — J9811 Atelectasis: Secondary | ICD-10-CM | POA: Diagnosis not present

## 2015-09-05 DIAGNOSIS — R05 Cough: Secondary | ICD-10-CM | POA: Diagnosis not present

## 2015-09-05 DIAGNOSIS — R059 Cough, unspecified: Secondary | ICD-10-CM

## 2015-09-13 DIAGNOSIS — L820 Inflamed seborrheic keratosis: Secondary | ICD-10-CM | POA: Diagnosis not present

## 2015-09-13 DIAGNOSIS — L570 Actinic keratosis: Secondary | ICD-10-CM | POA: Diagnosis not present

## 2015-09-13 DIAGNOSIS — L821 Other seborrheic keratosis: Secondary | ICD-10-CM | POA: Diagnosis not present

## 2015-09-13 DIAGNOSIS — D485 Neoplasm of uncertain behavior of skin: Secondary | ICD-10-CM | POA: Diagnosis not present

## 2015-09-15 DIAGNOSIS — R490 Dysphonia: Secondary | ICD-10-CM | POA: Diagnosis not present

## 2015-09-15 DIAGNOSIS — R1310 Dysphagia, unspecified: Secondary | ICD-10-CM | POA: Diagnosis not present

## 2015-09-15 DIAGNOSIS — K219 Gastro-esophageal reflux disease without esophagitis: Secondary | ICD-10-CM | POA: Diagnosis not present

## 2015-09-15 DIAGNOSIS — R059 Cough, unspecified: Secondary | ICD-10-CM | POA: Insufficient documentation

## 2015-09-15 DIAGNOSIS — R05 Cough: Secondary | ICD-10-CM | POA: Insufficient documentation

## 2015-09-15 DIAGNOSIS — R51 Headache: Secondary | ICD-10-CM | POA: Diagnosis not present

## 2015-09-16 ENCOUNTER — Other Ambulatory Visit (INDEPENDENT_AMBULATORY_CARE_PROVIDER_SITE_OTHER): Payer: Self-pay | Admitting: *Deleted

## 2015-09-16 DIAGNOSIS — Z86010 Personal history of colonic polyps: Secondary | ICD-10-CM

## 2015-09-16 DIAGNOSIS — Z800 Family history of malignant neoplasm of digestive organs: Secondary | ICD-10-CM | POA: Insufficient documentation

## 2015-09-19 DIAGNOSIS — Z1389 Encounter for screening for other disorder: Secondary | ICD-10-CM | POA: Diagnosis not present

## 2015-09-19 DIAGNOSIS — F4320 Adjustment disorder, unspecified: Secondary | ICD-10-CM | POA: Diagnosis not present

## 2015-09-19 DIAGNOSIS — Z6829 Body mass index (BMI) 29.0-29.9, adult: Secondary | ICD-10-CM | POA: Diagnosis not present

## 2015-09-19 DIAGNOSIS — N644 Mastodynia: Secondary | ICD-10-CM | POA: Diagnosis not present

## 2015-09-19 DIAGNOSIS — E663 Overweight: Secondary | ICD-10-CM | POA: Diagnosis not present

## 2015-09-22 ENCOUNTER — Other Ambulatory Visit (HOSPITAL_COMMUNITY): Payer: Self-pay | Admitting: Family Medicine

## 2015-09-26 DIAGNOSIS — M170 Bilateral primary osteoarthritis of knee: Secondary | ICD-10-CM | POA: Diagnosis not present

## 2015-09-26 DIAGNOSIS — M1711 Unilateral primary osteoarthritis, right knee: Secondary | ICD-10-CM | POA: Diagnosis not present

## 2015-09-27 ENCOUNTER — Other Ambulatory Visit (HOSPITAL_COMMUNITY): Payer: Self-pay | Admitting: Family Medicine

## 2015-09-29 DIAGNOSIS — M1712 Unilateral primary osteoarthritis, left knee: Secondary | ICD-10-CM | POA: Diagnosis not present

## 2015-10-04 DIAGNOSIS — M25462 Effusion, left knee: Secondary | ICD-10-CM | POA: Diagnosis not present

## 2015-10-04 DIAGNOSIS — Z96652 Presence of left artificial knee joint: Secondary | ICD-10-CM | POA: Diagnosis not present

## 2015-10-04 DIAGNOSIS — M25662 Stiffness of left knee, not elsewhere classified: Secondary | ICD-10-CM | POA: Diagnosis not present

## 2015-10-04 DIAGNOSIS — M25562 Pain in left knee: Secondary | ICD-10-CM | POA: Diagnosis not present

## 2015-10-06 DIAGNOSIS — M170 Bilateral primary osteoarthritis of knee: Secondary | ICD-10-CM | POA: Diagnosis not present

## 2015-10-06 DIAGNOSIS — M1711 Unilateral primary osteoarthritis, right knee: Secondary | ICD-10-CM | POA: Diagnosis not present

## 2015-10-10 DIAGNOSIS — M25462 Effusion, left knee: Secondary | ICD-10-CM | POA: Diagnosis not present

## 2015-10-10 DIAGNOSIS — Z96652 Presence of left artificial knee joint: Secondary | ICD-10-CM | POA: Diagnosis not present

## 2015-10-10 DIAGNOSIS — M25562 Pain in left knee: Secondary | ICD-10-CM | POA: Diagnosis not present

## 2015-10-10 DIAGNOSIS — M25662 Stiffness of left knee, not elsewhere classified: Secondary | ICD-10-CM | POA: Diagnosis not present

## 2015-10-13 DIAGNOSIS — Z96652 Presence of left artificial knee joint: Secondary | ICD-10-CM | POA: Diagnosis not present

## 2015-10-13 DIAGNOSIS — M170 Bilateral primary osteoarthritis of knee: Secondary | ICD-10-CM | POA: Diagnosis not present

## 2015-10-19 ENCOUNTER — Other Ambulatory Visit (HOSPITAL_COMMUNITY): Payer: Self-pay | Admitting: Family Medicine

## 2015-10-19 DIAGNOSIS — M25662 Stiffness of left knee, not elsewhere classified: Secondary | ICD-10-CM | POA: Diagnosis not present

## 2015-10-19 DIAGNOSIS — M25562 Pain in left knee: Secondary | ICD-10-CM | POA: Diagnosis not present

## 2015-10-19 DIAGNOSIS — M25462 Effusion, left knee: Secondary | ICD-10-CM | POA: Diagnosis not present

## 2015-10-19 DIAGNOSIS — N644 Mastodynia: Secondary | ICD-10-CM

## 2015-10-19 DIAGNOSIS — Z96652 Presence of left artificial knee joint: Secondary | ICD-10-CM | POA: Diagnosis not present

## 2015-10-20 ENCOUNTER — Other Ambulatory Visit (HOSPITAL_COMMUNITY): Payer: Self-pay | Admitting: Family Medicine

## 2015-10-20 DIAGNOSIS — N644 Mastodynia: Secondary | ICD-10-CM

## 2015-10-24 ENCOUNTER — Ambulatory Visit (INDEPENDENT_AMBULATORY_CARE_PROVIDER_SITE_OTHER): Payer: BLUE CROSS/BLUE SHIELD | Admitting: Podiatry

## 2015-10-24 ENCOUNTER — Ambulatory Visit: Payer: Self-pay

## 2015-10-24 ENCOUNTER — Encounter: Payer: Self-pay | Admitting: Podiatry

## 2015-10-24 DIAGNOSIS — M25562 Pain in left knee: Secondary | ICD-10-CM | POA: Diagnosis not present

## 2015-10-24 DIAGNOSIS — M79672 Pain in left foot: Principal | ICD-10-CM

## 2015-10-24 DIAGNOSIS — Z96652 Presence of left artificial knee joint: Secondary | ICD-10-CM | POA: Diagnosis not present

## 2015-10-24 DIAGNOSIS — M722 Plantar fascial fibromatosis: Secondary | ICD-10-CM

## 2015-10-24 DIAGNOSIS — M79671 Pain in right foot: Secondary | ICD-10-CM

## 2015-10-24 DIAGNOSIS — M25462 Effusion, left knee: Secondary | ICD-10-CM | POA: Diagnosis not present

## 2015-10-24 DIAGNOSIS — M25662 Stiffness of left knee, not elsewhere classified: Secondary | ICD-10-CM | POA: Diagnosis not present

## 2015-10-24 MED ORDER — TRIAMCINOLONE ACETONIDE 10 MG/ML IJ SUSP
10.0000 mg | Freq: Once | INTRAMUSCULAR | Status: AC
  Administered 2015-10-24: 10 mg

## 2015-10-26 DIAGNOSIS — M25462 Effusion, left knee: Secondary | ICD-10-CM | POA: Diagnosis not present

## 2015-10-26 DIAGNOSIS — Z96652 Presence of left artificial knee joint: Secondary | ICD-10-CM | POA: Diagnosis not present

## 2015-10-26 DIAGNOSIS — M25662 Stiffness of left knee, not elsewhere classified: Secondary | ICD-10-CM | POA: Diagnosis not present

## 2015-10-26 DIAGNOSIS — M25562 Pain in left knee: Secondary | ICD-10-CM | POA: Diagnosis not present

## 2015-10-26 NOTE — Progress Notes (Signed)
Subjective:     Patient ID: Joyce Herrera, female   DOB: [birth year], 55 y.o.   MRN: [id number]  HPI patient states both my heels have been bother me a lot and I need medication   Review of Systems     Objective:   Physical Exam Neurovascular status intact with exquisite discomfort plantar aspect heel region bilateral    Assessment:     Plantar fasciitis bilateral with inflammation    Plan:     Injected the plantar fascia bilateral 3 mg Kenalog 5 mg Xylocaine and advised on physical therapy anti-inflammatories

## 2015-10-31 DIAGNOSIS — M25462 Effusion, left knee: Secondary | ICD-10-CM | POA: Diagnosis not present

## 2015-10-31 DIAGNOSIS — Z96652 Presence of left artificial knee joint: Secondary | ICD-10-CM | POA: Diagnosis not present

## 2015-10-31 DIAGNOSIS — M25662 Stiffness of left knee, not elsewhere classified: Secondary | ICD-10-CM | POA: Diagnosis not present

## 2015-10-31 DIAGNOSIS — M25562 Pain in left knee: Secondary | ICD-10-CM | POA: Diagnosis not present

## 2015-11-02 DIAGNOSIS — M25462 Effusion, left knee: Secondary | ICD-10-CM | POA: Diagnosis not present

## 2015-11-02 DIAGNOSIS — Z96652 Presence of left artificial knee joint: Secondary | ICD-10-CM | POA: Diagnosis not present

## 2015-11-02 DIAGNOSIS — M25562 Pain in left knee: Secondary | ICD-10-CM | POA: Diagnosis not present

## 2015-11-02 DIAGNOSIS — M25662 Stiffness of left knee, not elsewhere classified: Secondary | ICD-10-CM | POA: Diagnosis not present

## 2015-11-04 DIAGNOSIS — Z96652 Presence of left artificial knee joint: Secondary | ICD-10-CM | POA: Diagnosis not present

## 2015-11-04 DIAGNOSIS — M25462 Effusion, left knee: Secondary | ICD-10-CM | POA: Diagnosis not present

## 2015-11-04 DIAGNOSIS — M25662 Stiffness of left knee, not elsewhere classified: Secondary | ICD-10-CM | POA: Diagnosis not present

## 2015-11-04 DIAGNOSIS — M25562 Pain in left knee: Secondary | ICD-10-CM | POA: Diagnosis not present

## 2015-11-07 DIAGNOSIS — Z23 Encounter for immunization: Secondary | ICD-10-CM | POA: Diagnosis not present

## 2015-11-10 DIAGNOSIS — Z96652 Presence of left artificial knee joint: Secondary | ICD-10-CM | POA: Diagnosis not present

## 2015-11-17 DIAGNOSIS — F321 Major depressive disorder, single episode, moderate: Secondary | ICD-10-CM | POA: Diagnosis not present

## 2015-11-22 ENCOUNTER — Ambulatory Visit (HOSPITAL_COMMUNITY)
Admission: RE | Admit: 2015-11-22 | Discharge: 2015-11-22 | Disposition: A | Discharge status: Home / Self Care | Payer: BLUE CROSS/BLUE SHIELD | Source: Ambulatory Visit | Attending: Family Medicine | Admitting: Family Medicine

## 2015-11-22 DIAGNOSIS — N644 Mastodynia: Secondary | ICD-10-CM

## 2015-11-22 DIAGNOSIS — N641 Fat necrosis of breast: Secondary | ICD-10-CM | POA: Diagnosis not present

## 2015-11-23 DIAGNOSIS — F321 Major depressive disorder, single episode, moderate: Secondary | ICD-10-CM | POA: Diagnosis not present

## 2015-11-30 DIAGNOSIS — G4440 Drug-induced headache, not elsewhere classified, not intractable: Secondary | ICD-10-CM | POA: Diagnosis not present

## 2015-11-30 DIAGNOSIS — G44219 Episodic tension-type headache, not intractable: Secondary | ICD-10-CM | POA: Diagnosis not present

## 2015-11-30 DIAGNOSIS — G43719 Chronic migraine without aura, intractable, without status migrainosus: Secondary | ICD-10-CM | POA: Diagnosis not present

## 2015-11-30 DIAGNOSIS — R413 Other amnesia: Secondary | ICD-10-CM | POA: Diagnosis not present

## 2015-12-01 ENCOUNTER — Encounter (INDEPENDENT_AMBULATORY_CARE_PROVIDER_SITE_OTHER): Payer: Self-pay | Admitting: *Deleted

## 2015-12-07 ENCOUNTER — Other Ambulatory Visit (HOSPITAL_COMMUNITY): Payer: Self-pay | Admitting: Family Medicine

## 2015-12-07 DIAGNOSIS — Z09 Encounter for follow-up examination after completed treatment for conditions other than malignant neoplasm: Secondary | ICD-10-CM

## 2015-12-07 DIAGNOSIS — N6321 Unspecified lump in the left breast, upper outer quadrant: Secondary | ICD-10-CM

## 2015-12-08 DIAGNOSIS — F321 Major depressive disorder, single episode, moderate: Secondary | ICD-10-CM | POA: Diagnosis not present

## 2015-12-09 ENCOUNTER — Other Ambulatory Visit: Payer: Self-pay | Admitting: Neurology

## 2015-12-09 DIAGNOSIS — R519 Headache, unspecified: Secondary | ICD-10-CM

## 2015-12-09 DIAGNOSIS — G4489 Other headache syndrome: Secondary | ICD-10-CM

## 2015-12-09 DIAGNOSIS — R51 Headache: Principal | ICD-10-CM

## 2015-12-13 DIAGNOSIS — Z1389 Encounter for screening for other disorder: Secondary | ICD-10-CM | POA: Diagnosis not present

## 2015-12-13 DIAGNOSIS — E782 Mixed hyperlipidemia: Secondary | ICD-10-CM | POA: Diagnosis not present

## 2015-12-13 DIAGNOSIS — K219 Gastro-esophageal reflux disease without esophagitis: Secondary | ICD-10-CM | POA: Diagnosis not present

## 2015-12-13 DIAGNOSIS — Z79899 Other long term (current) drug therapy: Secondary | ICD-10-CM | POA: Diagnosis not present

## 2015-12-13 DIAGNOSIS — I10 Essential (primary) hypertension: Secondary | ICD-10-CM | POA: Diagnosis not present

## 2015-12-13 DIAGNOSIS — Z6830 Body mass index (BMI) 30.0-30.9, adult: Secondary | ICD-10-CM | POA: Diagnosis not present

## 2015-12-13 DIAGNOSIS — R5383 Other fatigue: Secondary | ICD-10-CM | POA: Diagnosis not present

## 2015-12-14 ENCOUNTER — Telehealth: Payer: Self-pay | Admitting: *Deleted

## 2015-12-14 MED ORDER — PREGABALIN 300 MG PO CAPS: 300.0000 mg | ORAL_CAPSULE | Freq: Two times a day (BID) | ORAL | 5 refills | Status: DC

## 2015-12-14 NOTE — Telephone Encounter (Signed)
Refill request received for Lyrica 300mg  #60 one capsule bid. Dr. Paulla Dolly ordered refill Lyrica as previously. Faxed orders to [facility].

## 2015-12-16 ENCOUNTER — Telehealth (INDEPENDENT_AMBULATORY_CARE_PROVIDER_SITE_OTHER): Payer: Self-pay | Admitting: *Deleted

## 2015-12-16 NOTE — Telephone Encounter (Signed)
agree

## 2015-12-16 NOTE — Telephone Encounter (Signed)
Referring MD/PCP: fusco   Procedure: tcs  Reason/Indication:  Hx polyps, fam hx colon ca  Has patient had this procedure before?  Yes, 2012  If so, when, by whom and where?    Is there a family history of colon cancer?  Yes, father  Who?  What age when diagnosed?    Is patient diabetic?   no      Does patient have prosthetic heart valve or mechanical valve?  no  Do you have a pacemaker?  no  Has patient ever had endocarditis? no  Has patient had joint replacement within last 12 months?  Yes, partial knee 09/29/15  Does patient tend to be constipated or take laxatives? no  Does patient have a history of alcohol/drug use?  no  Is patient on Coumadin, Plavix and/or Aspirin? No     No propofol per rehman Medications: lyrica 300 mg daily, meloxicam 15 mg daily, propranolol 40 mg daily, pantoprazole 40 mg daily, nasal spray daily, calcium 1200 mg daily, stool softener, vit d3, Claritin daily  Allergies: pcn, sulfu drugs  Medication Adjustment:   Procedure date & time: 01/11/16 at 930

## 2015-12-19 ENCOUNTER — Telehealth: Payer: Self-pay | Admitting: *Deleted

## 2015-12-19 NOTE — Telephone Encounter (Addendum)
Pt states [employer] states they have sent 3 refill request for Lyrica. I reviewed Medication orders and the paper fax history and the rx refill was confirmed received by [place] 12/14/2015 at 14:31. I informed pt of all of this information.

## 2015-12-21 ENCOUNTER — Ambulatory Visit
Admission: RE | Admit: 2015-12-21 | Discharge: 2015-12-21 | Disposition: A | Discharge status: Home / Self Care | Payer: BLUE CROSS/BLUE SHIELD | Source: Ambulatory Visit | Attending: Neurology | Admitting: Neurology

## 2015-12-21 DIAGNOSIS — R519 Headache, unspecified: Secondary | ICD-10-CM

## 2015-12-21 DIAGNOSIS — R51 Headache: Principal | ICD-10-CM

## 2015-12-28 ENCOUNTER — Other Ambulatory Visit: Payer: Self-pay | Admitting: "Endocrinology

## 2015-12-29 DIAGNOSIS — F321 Major depressive disorder, single episode, moderate: Secondary | ICD-10-CM | POA: Diagnosis not present

## 2016-01-03 DIAGNOSIS — Z1389 Encounter for screening for other disorder: Secondary | ICD-10-CM | POA: Diagnosis not present

## 2016-01-03 DIAGNOSIS — J069 Acute upper respiratory infection, unspecified: Secondary | ICD-10-CM | POA: Diagnosis not present

## 2016-01-03 DIAGNOSIS — J343 Hypertrophy of nasal turbinates: Secondary | ICD-10-CM | POA: Diagnosis not present

## 2016-01-03 DIAGNOSIS — Z6830 Body mass index (BMI) 30.0-30.9, adult: Secondary | ICD-10-CM | POA: Diagnosis not present

## 2016-01-03 DIAGNOSIS — J209 Acute bronchitis, unspecified: Secondary | ICD-10-CM | POA: Diagnosis not present

## 2016-01-03 DIAGNOSIS — R070 Pain in throat: Secondary | ICD-10-CM | POA: Diagnosis not present

## 2016-01-11 ENCOUNTER — Encounter (HOSPITAL_COMMUNITY)
Admission: RE | Disposition: A | Discharge status: Home / Self Care | Payer: Self-pay | Source: Ambulatory Visit | Attending: Internal Medicine

## 2016-01-11 ENCOUNTER — Ambulatory Visit (HOSPITAL_COMMUNITY)
Admission: RE | Admit: 2016-01-11 | Discharge: 2016-01-11 | Disposition: A | Discharge status: Home / Self Care | Payer: BLUE CROSS/BLUE SHIELD | Source: Ambulatory Visit | Attending: Internal Medicine | Admitting: Internal Medicine

## 2016-01-11 ENCOUNTER — Encounter (HOSPITAL_COMMUNITY): Payer: Self-pay | Admitting: *Deleted

## 2016-01-11 DIAGNOSIS — Z86010 Personal history of colonic polyps: Secondary | ICD-10-CM | POA: Insufficient documentation

## 2016-01-11 DIAGNOSIS — Z7982 Long term (current) use of aspirin: Secondary | ICD-10-CM | POA: Insufficient documentation

## 2016-01-11 DIAGNOSIS — K648 Other hemorrhoids: Secondary | ICD-10-CM | POA: Diagnosis not present

## 2016-01-11 DIAGNOSIS — Z800 Family history of malignant neoplasm of digestive organs: Secondary | ICD-10-CM | POA: Diagnosis not present

## 2016-01-11 DIAGNOSIS — D125 Benign neoplasm of sigmoid colon: Secondary | ICD-10-CM | POA: Diagnosis not present

## 2016-01-11 DIAGNOSIS — Z1211 Encounter for screening for malignant neoplasm of colon: Secondary | ICD-10-CM | POA: Insufficient documentation

## 2016-01-11 DIAGNOSIS — E039 Hypothyroidism, unspecified: Secondary | ICD-10-CM | POA: Insufficient documentation

## 2016-01-11 DIAGNOSIS — Z880 Allergy status to penicillin: Secondary | ICD-10-CM | POA: Diagnosis not present

## 2016-01-11 DIAGNOSIS — Z79899 Other long term (current) drug therapy: Secondary | ICD-10-CM | POA: Insufficient documentation

## 2016-01-11 DIAGNOSIS — K644 Residual hemorrhoidal skin tags: Secondary | ICD-10-CM | POA: Insufficient documentation

## 2016-01-11 DIAGNOSIS — D123 Benign neoplasm of transverse colon: Secondary | ICD-10-CM | POA: Diagnosis not present

## 2016-01-11 DIAGNOSIS — K5730 Diverticulosis of large intestine without perforation or abscess without bleeding: Secondary | ICD-10-CM | POA: Insufficient documentation

## 2016-01-11 DIAGNOSIS — Z09 Encounter for follow-up examination after completed treatment for conditions other than malignant neoplasm: Secondary | ICD-10-CM | POA: Diagnosis not present

## 2016-01-11 DIAGNOSIS — Z87891 Personal history of nicotine dependence: Secondary | ICD-10-CM | POA: Insufficient documentation

## 2016-01-11 HISTORY — PX: COLONOSCOPY: SHX5424

## 2016-01-11 HISTORY — PX: POLYPECTOMY: SHX5525

## 2016-01-11 SURGERY — COLONOSCOPY
Anesthesia: Moderate Sedation

## 2016-01-11 MED ORDER — LEVOFLOXACIN IN D5W 750 MG/150ML IV SOLN
INTRAVENOUS | Status: AC
  Filled 2016-01-11: qty 150

## 2016-01-11 MED ORDER — MEPERIDINE HCL 50 MG/ML IJ SOLN
INTRAMUSCULAR | Status: AC
  Filled 2016-01-11: qty 1

## 2016-01-11 MED ORDER — SODIUM CHLORIDE 0.9 % IV SOLN
INTRAVENOUS | Status: DC
  Administered 2016-01-11: 1000 mL via INTRAVENOUS

## 2016-01-11 MED ORDER — MEPERIDINE HCL 50 MG/ML IJ SOLN
INTRAMUSCULAR | Status: DC | PRN
  Administered 2016-01-11 (×2): 25 mg via INTRAVENOUS

## 2016-01-11 MED ORDER — STERILE WATER FOR IRRIGATION IR SOLN
Status: DC | PRN
  Administered 2016-01-11: 10:00:00

## 2016-01-11 MED ORDER — MIDAZOLAM HCL 5 MG/5ML IJ SOLN
INTRAMUSCULAR | Status: DC | PRN
  Administered 2016-01-11 (×3): 2 mg via INTRAVENOUS

## 2016-01-11 MED ORDER — MIDAZOLAM HCL 5 MG/5ML IJ SOLN
INTRAMUSCULAR | Status: AC
  Filled 2016-01-11: qty 10

## 2016-01-11 MED ORDER — LEVOFLOXACIN IN D5W 750 MG/150ML IV SOLN
750.0000 mg | Freq: Once | INTRAVENOUS | Status: AC
  Administered 2016-01-11: 750 mg via INTRAVENOUS

## 2016-01-11 NOTE — Op Note (Signed)
[facility] Patient Name: Joyce Herrera Procedure Date: 01/11/2016 9:19 AM MRN: [id number] Date of Birth: [birth year] Attending MD: Hildred Laser , MD CSN: [id number] Age: 55 Admit Type: Outpatient Procedure:                Colonoscopy Indications:              High risk colon cancer surveillance: Personal                            history of colonic polyps, Family history of colon                            cancer in a first-degree relative Providers:                Hildred Laser, MD, Otis Peak B. Sharon Seller, RN, Isabella Stalling, Technician Referring MD:             [employer]mond [employer], MD Medicines:                Meperidine 50 mg IV, Midazolam 6 mg IV Complications:            No immediate complications. Estimated Blood Loss:     Estimated blood loss was minimal. Procedure:                Pre-Anesthesia Assessment:                           - Prior to the procedure, a History and Physical                            was performed, and patient medications and                            allergies were reviewed. The patient's tolerance of                            previous anesthesia was also reviewed. The risks                            and benefits of the procedure and the sedation                            options and risks were discussed with the patient.                            All questions were answered, and informed consent                            was obtained. Prior Anticoagulants: The patient                            last took previous NSAID medication 5 days prior to  the procedure. ASA Grade Assessment: II - A patient                            with mild systemic disease. After reviewing the                            risks and benefits, the patient was deemed in                            satisfactory condition to undergo the procedure.                           After obtaining informed consent, the colonoscope                          was passed under direct vision. Throughout the                            procedure, the patient's blood pressure, pulse, and                            oxygen saturations were monitored continuously. The                            [id number]) scope was introduced through                            the anus and advanced to the the terminal ileum,                            with identification of the appendiceal orifice and                            IC valve. The colonoscopy was performed without                            difficulty. The patient tolerated the procedure                            well. The quality of the bowel preparation was                            adequate. The terminal ileum, ileocecal valve,                            appendiceal orifice, and rectum were photographed. Scope In: 9:41:10 AM Scope Out: 10:04:35 AM Scope Withdrawal Time: 0 hours 13 minutes 46 seconds  Total Procedure Duration: 0 hours 23 minutes 25 seconds  Findings:      The perianal and digital rectal examinations were normal.      Two sessile polyps were found in the sigmoid colon and splenic flexure.       The polyps were small in size. These were biopsied with a cold forceps       for histology. The  pathology specimen was placed into Bottle Number 1.      Multiple medium-mouthed diverticula were found in the sigmoid colon.      External and internal hemorrhoids were found during retroflexion. The       hemorrhoids were small. Impression:               - Two small polyps in the sigmoid colon and at the                            splenic flexure. Biopsied.                           - Diverticulosis in the sigmoid colon.                           - External and internal hemorrhoids. Moderate Sedation:      Moderate (conscious) sedation was administered by the endoscopy nurse       and supervised by the endoscopist. The following parameters were       monitored:  oxygen saturation, heart rate, blood pressure, CO2       capnography and response to care. Total physician intraservice time was       28 minutes. Recommendation:           - Patient has a contact number available for                            emergencies. The signs and symptoms of potential                            delayed complications were discussed with the                            patient. Return to normal activities tomorrow.                            Written discharge instructions were provided to the                            patient.                           - High fiber diet today.                           - Continue present medications.                           - Await pathology results.                           - Repeat colonoscopy in 5 years for surveillance. Procedure Code(s):        --- Professional ---                           [phone], Colonoscopy, flexible; with biopsy, single  or multiple                           99152, Moderate sedation services provided by the                            same physician or other qualified health care                            professional performing the diagnostic or                            therapeutic service that the sedation supports,                            requiring the presence of an independent trained                            observer to assist in the monitoring of the                            patient's level of consciousness and physiological                            status; initial 15 minutes of intraservice time,                            patient age 63 years or older                           [phone], Moderate sedation services; each additional                            15 minutes intraservice time Diagnosis Code(s):        --- Professional ---                           Z86.010, Personal history of colonic polyps                           D12.5, Benign neoplasm of sigmoid  colon                           D12.3, Benign neoplasm of transverse colon (hepatic                            flexure or splenic flexure)                           K64.8, Other hemorrhoids                           Z80.0, Family history of malignant neoplasm of                            digestive organs  K57.30, Diverticulosis of large intestine without                            perforation or abscess without bleeding CPT copyright 2016 American Medical Association. All rights reserved. The codes documented in this report are preliminary and upon coder review may  be revised to meet current compliance requirements. Hildred Laser, MD Hildred Laser, MD 01/11/2016 10:13:59 AM This report has been signed electronically. Number of Addenda: 0

## 2016-01-11 NOTE — Discharge Instructions (Addendum)
Colon Polyps Introduction Polyps are tissue growths inside the body. Polyps can grow in many places, including the large intestine (colon). A polyp may be a round bump or a mushroom-shaped growth. You could have one polyp or several. Most colon polyps are noncancerous (benign). However, some colon polyps can become cancerous over time. What are the causes? The exact cause of colon polyps is not known. What increases the risk? This condition is more likely to develop in people who:  Have a family history of colon cancer or colon polyps.  Are older than 58 or older than 45 if they are African American.  Have inflammatory bowel disease, such as ulcerative colitis or Crohn disease.  Are overweight.  Smoke cigarettes.  Do not get enough exercise.  Drink too much alcohol.  Eat a diet that is:  High in fat and red meat.  Low in fiber.  Had childhood cancer that was treated with abdominal radiation. What are the signs or symptoms? Most polyps do not cause symptoms. If you have symptoms, they may include:  Blood coming from your rectum when having a bowel movement.  Blood in your stool.The stool may look dark red or black.  A change in bowel habits, such as constipation or diarrhea. How is this diagnosed? This condition is diagnosed with a colonoscopy. This is a procedure that uses a lighted, flexible scope to look at the inside of your colon. How is this treated? Treatment for this condition involves removing any polyps that are found. Those polyps will then be tested for cancer. If cancer is found, your health care provider will talk to you about options for colon cancer treatment. Follow these instructions at home: Diet  Eat plenty of fiber, such as fruits, vegetables, and whole grains.  Eat foods that are high in calcium and vitamin D, such as milk, cheese, yogurt, eggs, liver, fish, and broccoli.  Limit foods high in fat, red meats, and processed meats, such as hot  dogs, sausage, bacon, and lunch meats.  Maintain a healthy weight, or lose weight if recommended by your health care provider. General instructions  Do not smoke cigarettes.  Do not drink alcohol excessively.  Keep all follow-up visits as told by your health care provider. This is important. This includes keeping regularly scheduled colonoscopies. Talk to your health care provider about when you need a colonoscopy.  Exercise every day or as told by your health care provider. Contact a health care provider if:  You have new or worsening bleeding during a bowel movement.  You have new or increased blood in your stool.  You have a change in bowel habits.  You unexpectedly lose weight. This information is not intended to replace advice given to you by your health care provider. Make sure you discuss any questions you have with your health care provider. Document Released: 10/12/2003 Document Revised: 06/23/2015 Document Reviewed: 12/06/2014  2017 Elsevier Colonoscopy, Adult, Care After This sheet gives you information about how to care for yourself after your procedure. Your health care provider may also give you more specific instructions. If you have problems or questions, contact your health care provider. What can I expect after the procedure? After the procedure, it is common to have:  A small amount of blood in your stool for 24 hours after the procedure.  Some gas.  Mild abdominal cramping or bloating. Follow these instructions at home: General instructions  For the first 24 hours after the procedure:  Do not drive or use  machinery.  Do not sign important documents.  Do not drink alcohol.  Do your regular daily activities at a slower pace than normal.  Eat soft, easy-to-digest foods.  Rest often.  Take over-the-counter or prescription medicines only as told by your health care provider.  It is up to you to get the results of your procedure. Ask your health  care provider, or the department performing the procedure, when your results will be ready. Relieving cramping and bloating  Try walking around when you have cramps or feel bloated.  Apply heat to your abdomen as told by your health care provider. Use a heat source that your health care provider recommends, such as a moist heat pack or a heating pad.  Place a towel between your skin and the heat source.  Leave the heat on for 20-30 minutes.  Remove the heat if your skin turns bright red. This is especially important if you are unable to feel pain, heat, or cold. You may have a greater risk of getting burned. Eating and drinking  Drink enough fluid to keep your urine clear or pale yellow.  Resume your normal diet as instructed by your health care provider. Avoid heavy or fried foods that are hard to digest.  Avoid drinking alcohol for as long as instructed by your health care provider. Contact a health care provider if:  You have blood in your stool 2-3 days after the procedure. Get help right away if:  You have more than a small spotting of blood in your stool.  You pass large blood clots in your stool.  Your abdomen is swollen.  You have nausea or vomiting.  You have a fever.  You have increasing abdominal pain that is not relieved with medicine. This information is not intended to replace advice given to you by your health care provider. Make sure you discuss any questions you have with your health care provider. Document Released: 08/30/2003 Document Revised: 10/10/2015 Document Reviewed: 03/29/2015 Elsevier Interactive Patient Education  2017 [place] usual medications and high fiber diet. No driving for 24 hours. Physician will call with biopsy results.

## 2016-01-11 NOTE — H&P (Signed)
Joyce Herrera is an 55 y.o. female.   Chief Complaint: Patient is here for colonoscopy. HPI: Patient is 55 year old Caucasian female was history of colonic adenoma and family history of cancer and multiple other malignancies for surveillance colonoscopy. She denies abdominal pain change in bowel habits or rectal bleeding. She had colonic adenoma on her first exam 10 years ago but none were found on her last exam of 2012. History significant for CRC and father was in the 33s at the time of diagnosis and is now 57 years old.  Past Medical History:  Diagnosis Date  . Headache(784.0)   . History of colon polyps   . Hypothyroidism   . PONV (postoperative nausea and vomiting)   . Seasonal allergies     Past Surgical History:  Procedure Laterality Date  . ABDOMINAL HYSTERECTOMY    . CHOLECYSTECTOMY    . COLONOSCOPY  09/14/2010   Procedure: COLONOSCOPY;  Surgeon: Rogene Houston, MD;  [facility];  Service: Endoscopy;  Laterality: N/A;  . NASAL SEPTUM SURGERY    . PLANTAR FASCIECTOMY     bilateral  . TONSILLECTOMY      Family History  Problem Relation Age of Onset  . Lung cancer Mother   . Kidney cancer Mother   . Colon cancer Father   . Prostate cancer Father   . Diabetes Father   . Hypertension Father   . Kidney failure Father    Social History:  reports that she has quit smoking. She has never used smokeless tobacco. She reports that she does not drink alcohol or use drugs.  Allergies:  Allergies  Allergen Reactions  . Penicillins Rash    Facility-Administered Medications Prior to Admission  Medication Dose Route Frequency Provider Last Rate Last Dose  . triamcinolone acetonide (KENALOG) 10 MG/ML injection 10 mg  10 mg Other Once Harriet Masson, DPM      . triamcinolone acetonide (KENALOG) 10 MG/ML injection 10 mg  10 mg Other Once Harriet Masson, DPM      . triamcinolone acetonide (KENALOG) 10 MG/ML injection 10 mg  10 mg Other Once Harriet Masson, DPM        Medications Prior to Admission  Medication Sig Dispense Refill  . Azelastine HCl 0.15 % SOLN   10  . ibuprofen (ADVIL,MOTRIN) 200 MG tablet Take 200 mg by mouth every 6 (six) hours as needed. Reported on 03/23/2015    . pregabalin (LYRICA) 300 MG capsule Take 1 capsule (300 mg total) by mouth 2 (two) times daily. 60 capsule 5  . propranolol (INDERAL) 40 MG tablet TAKE (1/2) TABLET BY MOUTH AT BEDTIME. 30 tablet 4  . ALPRAZolam (XANAX) 0.5 MG tablet     . aspirin 81 MG tablet Take 81 mg by mouth daily. Reported on 03/23/2015    . butalbital-acetaminophen-caffeine (FIORICET, ESGIC) 50-325-40 MG per tablet Reported on 03/23/2015    . calcium carbonate (OS-CAL) 600 MG TABS Take 600 mg by mouth 2 (two) times daily with a meal.      . meloxicam (MOBIC) 15 MG tablet Take 15 mg by mouth daily.   2  . pantoprazole (PROTONIX) 40 MG tablet Take 40 mg by mouth daily.  10    No results found for this or any previous visit (from the past 48 hour(s)). No results found.  ROS  Blood pressure 107/79, pulse 88, temperature 98.7 F (37.1 C), temperature source Oral, resp. rate (!) 25, height 5\' 5"  (1.651 m), weight 186 lb (  84.4 kg), last menstrual period 01/30/1992, SpO2 97 %. Physical Exam  Constitutional: She appears well-developed and well-nourished.  HENT:  Mouth/Throat: Oropharynx is clear and moist.  Eyes: Conjunctivae are normal. No scleral icterus.  Neck: No thyromegaly present.  Cardiovascular: Normal rate, regular rhythm and normal heart sounds.   No murmur heard. Respiratory: Effort normal and breath sounds normal.  GI: Soft. She exhibits no distension and no mass. There is no tenderness.  Musculoskeletal: She exhibits no edema.  Lymphadenopathy:    She has no cervical adenopathy.  Neurological: She is alert.  Skin: Skin is warm and dry.     Assessment/Plan History of colonic adenoma. Family history of CRC in father. Surveillance colonoscopy.  Hildred Laser, MD 01/11/2016, 9:31  AM

## 2016-01-12 DIAGNOSIS — F321 Major depressive disorder, single episode, moderate: Secondary | ICD-10-CM | POA: Diagnosis not present

## 2016-01-13 ENCOUNTER — Encounter (HOSPITAL_COMMUNITY): Payer: Self-pay | Admitting: Internal Medicine

## 2016-01-17 DIAGNOSIS — F321 Major depressive disorder, single episode, moderate: Secondary | ICD-10-CM | POA: Diagnosis not present

## 2016-02-28 ENCOUNTER — Telehealth: Payer: Self-pay | Admitting: *Deleted

## 2016-02-28 NOTE — Telephone Encounter (Signed)
"  We need authorization for Lyrica.  She has [place].  We tried to send it through [employer].  Patient is getting a little irritated because she has been waiting on this."  I will work on it.

## 2016-03-01 ENCOUNTER — Telehealth: Payer: Self-pay | Admitting: *Deleted

## 2016-03-01 ENCOUNTER — Ambulatory Visit: Payer: BLUE CROSS/BLUE SHIELD | Admitting: "Endocrinology

## 2016-03-01 NOTE — Telephone Encounter (Addendum)
Pt's Lyrica 300mg  was denied by [facility]. FAxed clinicals to [facility] and gave Dr. Paulla Dolly the PEER to [facility]ER [phone] to call for appeal. 03/21/2016-[facility] faxed notice that Lyrica still did not have Prior Authorization.03/27/2016-Dr. Regal states pt would benefit from having her PCP write for this medication and monitor her long-term. I informed pt and told her her PCP should follow her for long term medications. Pt asked what the PCP would do and I told her possibly blood work. Pt states why didn't she have that before and I told her that is why she should be seen by her PCP.  Pt states it is about time for her to get her shots for her feet, I told her I would have the schedulers call her tomorrow and pt states have them call the next day.

## 2016-03-03 NOTE — Telephone Encounter (Signed)
Remind me when I get back

## 2016-03-06 ENCOUNTER — Other Ambulatory Visit: Payer: Self-pay

## 2016-03-06 ENCOUNTER — Other Ambulatory Visit: Payer: Self-pay | Admitting: "Endocrinology

## 2016-03-06 DIAGNOSIS — E0590 Thyrotoxicosis, unspecified without thyrotoxic crisis or storm: Secondary | ICD-10-CM

## 2016-03-06 DIAGNOSIS — F321 Major depressive disorder, single episode, moderate: Secondary | ICD-10-CM | POA: Diagnosis not present

## 2016-03-06 LAB — TSH: TSH: 3.71 m[IU]/L

## 2016-03-06 LAB — T4, FREE: Free T4: 1.2 ng/dL (ref 0.8–1.8)

## 2016-03-13 NOTE — Telephone Encounter (Signed)
Lyrica was denied, stated she needed to try Gabapentin.

## 2016-03-14 ENCOUNTER — Ambulatory Visit (INDEPENDENT_AMBULATORY_CARE_PROVIDER_SITE_OTHER): Payer: BLUE CROSS/BLUE SHIELD | Admitting: "Endocrinology

## 2016-03-14 ENCOUNTER — Encounter: Payer: Self-pay | Admitting: "Endocrinology

## 2016-03-14 VITALS — BP 104/72 | HR 97 | Ht 63.0 in | Wt 186.0 lb

## 2016-03-14 DIAGNOSIS — E782 Mixed hyperlipidemia: Secondary | ICD-10-CM

## 2016-03-14 DIAGNOSIS — Z6830 Body mass index (BMI) 30.0-30.9, adult: Secondary | ICD-10-CM | POA: Diagnosis not present

## 2016-03-14 DIAGNOSIS — E0590 Thyrotoxicosis, unspecified without thyrotoxic crisis or storm: Secondary | ICD-10-CM | POA: Diagnosis not present

## 2016-03-14 DIAGNOSIS — E6609 Other obesity due to excess calories: Secondary | ICD-10-CM | POA: Diagnosis not present

## 2016-03-14 NOTE — Progress Notes (Signed)
Subjective:    Patient ID: Joyce Herrera, female    DOB: September 04, 1960, PCP Glo Herring., MD   Past Medical History:  Diagnosis Date  . Headache(784.0)   . History of colon polyps   . Hypothyroidism   . PONV (postoperative nausea and vomiting)   . Seasonal allergies    Past Surgical History:  Procedure Laterality Date  . ABDOMINAL HYSTERECTOMY    . CHOLECYSTECTOMY    . COLONOSCOPY  09/14/2010   Procedure: COLONOSCOPY;  Surgeon: Rogene Houston, MD;  [facility];  Service: Endoscopy;  Laterality: N/A;  . COLONOSCOPY N/A 01/11/2016   Procedure: COLONOSCOPY;  Surgeon: Rogene Houston, MD;  Location: [facility];  Service: Endoscopy;  Laterality: N/A;  930  . NASAL SEPTUM SURGERY    . PLANTAR FASCIECTOMY     bilateral  . POLYPECTOMY  01/11/2016   Procedure: POLYPECTOMY;  Surgeon: Rogene Houston, MD;  Location: [facility];  Service: Endoscopy;;  multiple colon polyps   . TONSILLECTOMY     Social History   Social History  . Marital status: Married    Spouse name: N/A  . Number of children: N/A  . Years of education: N/A   Social History Main Topics  . Smoking status: Former Research scientist (life sciences)  . Smokeless tobacco: Never Used     Comment: quit October 2004  . Alcohol use No  . Drug use: No  . Sexual activity: Not Asked   Other Topics Concern  . None   Social History Narrative  . None   Outpatient Encounter Prescriptions as of 03/14/2016  Medication Sig  . DULoxetine (CYMBALTA) 60 MG capsule Take 60 mg by mouth daily.  . flurbiprofen (ANSAID) 50 MG tablet Take 50 mg by mouth 2 (two) times daily.  Marland Kitchen ALPRAZolam (XANAX) 0.5 MG tablet   . Azelastine HCl 0.15 % SOLN   . butalbital-acetaminophen-caffeine (FIORICET, ESGIC) 50-325-40 MG per tablet Reported on 03/23/2015  . calcium carbonate (OS-CAL) 600 MG TABS Take 600 mg by mouth 2 (two) times daily with a meal.    . esomeprazole (NEXIUM) 40 MG capsule Take 1 capsule (40 mg total) by mouth daily before  breakfast.  . ibuprofen (ADVIL,MOTRIN) 200 MG tablet Take 200 mg by mouth every 6 (six) hours as needed. Reported on 03/23/2015  . meloxicam (MOBIC) 15 MG tablet Take 15 mg by mouth daily.   . pregabalin (LYRICA) 300 MG capsule Take 1 capsule (300 mg total) by mouth 2 (two) times daily.  . propranolol (INDERAL) 40 MG tablet TAKE (1/2) TABLET BY MOUTH AT BEDTIME.  . [DISCONTINUED] triamcinolone acetonide (KENALOG) 10 MG/ML injection 10 mg   . [DISCONTINUED] triamcinolone acetonide (KENALOG) 10 MG/ML injection 10 mg   . [DISCONTINUED] triamcinolone acetonide (KENALOG) 10 MG/ML injection 10 mg    No facility-administered encounter medications on file as of 03/14/2016.    ALLERGIES: Allergies  Allergen Reactions  . Penicillins Rash   VACCINATION STATUS:  There is no immunization history on file for this patient.  HPI  56 yr old female With medical history as above. Her history includes history of mild hyperthyroidism which required brief therapy with methimazole prior to her last visit. She is currently not on antithyroid medications. She is on propranolol 20 mg by mouth daily for symptomatic control.  - She complains of fatigue, progressive weight gain.  Her TFTs indicate WNL. she denies any family hx of thyroid dysfunction.  Review of Systems Constitutional: + weight gain, +  fatigue, no subjective hyperthermia/hypothermia Eyes: no blurry vision, no xerophthalmia ENT: no sore throat, no nodules palpated in throat, no dysphagia/odynophagia, no hoarseness Cardiovascular: no CP/SOB/palpitations/leg swelling Respiratory: no cough/SOB Gastrointestinal: no N/V/D/C Musculoskeletal: + Bilateral knee pain on analgesics Skin: no rashes Neurological: no tremors/numbness/tingling/dizziness Psychiatric: no depression/anxiety  Objective:    BP 104/72   Pulse 97   Ht 5\' 3"  (1.6 m)   Wt 186 lb (84.4 kg)   LMP 01/30/1992   BMI 32.95 kg/m   Wt Readings from Last 3 Encounters:  03/14/16 186  lb (84.4 kg)  01/11/16 186 lb (84.4 kg)  08/24/15 174 lb (78.9 kg)    Physical Exam  .Constitutional: overweight, in NAD Eyes: PERRLA, EOMI, no exophthalmos ENT: moist mucous membranes, no thyromegaly, no cervical lymphadenopathy Cardiovascular: RRR, No MRG Respiratory: CTA B Gastrointestinal: abdomen soft, NT, ND, BS+ Musculoskeletal: She wears bilateral knee braces , strength intact in all 4 Skin: moist, warm, no rashes Neurological: no tremor with outstretched hands, DTR normal in all 4  Recent Results (from the past 2160 hour(s))  TSH     Status: None   Collection Time: 03/06/16 11:48 AM  Result Value Ref Range   TSH 3.71 mIU/L    Comment:   Reference Range   > or = 20 Years  0.40-4.50   Pregnancy Range First trimester  0.26-2.66 Second trimester 0.55-2.73 Third trimester  0.43-2.91     T4, Free     Status: None   Collection Time: 03/06/16 11:50 AM  Result Value Ref Range   Free T4 1.2 0.8 - 1.8 ng/dL     She did have thyroid ultrasound in 2012 which showed subcentimeter nodules which were stable over a period of time.  Assessment & Plan:   1.  Hyperthyroidism- resolved - Resolved,  she will not require antithyroid therapy for now. Her TSH is 3.7 along with free T4 1.2. -I advised her to continue low dose propranolol 20 mg by mouth daily given her mild tachycardia.  2. Hyperlipidemia -  Based on her lipid panel prior to her last visit, Her HDL is excellent at 112 along with LDL high at 144. She will not require antilipid therapy at this point. -She is not interested in resuming weight loss medication, qsymia.   - I advised patient to maintain close follow up with Glo Herring., MD for primary care needs. Follow up plan: Return in about 1 year (around 03/14/2017) for follow up with pre-visit labs.  Glade Lloyd, MD Phone: [phone]  Fax: [phone]   03/14/2016, 11:54 AM

## 2016-03-15 DIAGNOSIS — Z6830 Body mass index (BMI) 30.0-30.9, adult: Secondary | ICD-10-CM | POA: Diagnosis not present

## 2016-03-15 DIAGNOSIS — N62 Hypertrophy of breast: Secondary | ICD-10-CM | POA: Diagnosis not present

## 2016-03-15 DIAGNOSIS — J3089 Other allergic rhinitis: Secondary | ICD-10-CM | POA: Diagnosis not present

## 2016-03-15 DIAGNOSIS — F329 Major depressive disorder, single episode, unspecified: Secondary | ICD-10-CM | POA: Diagnosis not present

## 2016-03-28 DIAGNOSIS — N62 Hypertrophy of breast: Secondary | ICD-10-CM | POA: Diagnosis not present

## 2016-04-09 ENCOUNTER — Encounter: Payer: Self-pay | Admitting: Podiatry

## 2016-04-09 ENCOUNTER — Ambulatory Visit (INDEPENDENT_AMBULATORY_CARE_PROVIDER_SITE_OTHER): Payer: BLUE CROSS/BLUE SHIELD | Admitting: Podiatry

## 2016-04-09 DIAGNOSIS — G609 Hereditary and idiopathic neuropathy, unspecified: Secondary | ICD-10-CM | POA: Diagnosis not present

## 2016-04-09 DIAGNOSIS — M722 Plantar fascial fibromatosis: Secondary | ICD-10-CM

## 2016-04-09 MED ORDER — TRIAMCINOLONE ACETONIDE 10 MG/ML IJ SUSP: 10.0000 mg | Freq: Once | INTRAMUSCULAR | Status: DC

## 2016-04-09 NOTE — Progress Notes (Signed)
Subjective:     Patient ID: Joyce Herrera, female   DOB: 01-07-1961, 56 y.o.   MRN: [id number]  HPI patient presents stating that her heels are hurting her quite a bit again and she needs orthotics for different types of shoes that she cannot wear them in any of her flats or dress shoes   Review of Systems     Objective:   Physical Exam Neurovascular status was found to be intact with patient found to have exquisite discomfort in the plantar heel region bilateral with elevation of the arch bilateral    Assessment:     Planter fasciitis bilateral with significant cavus foot type    Plan:     H&P conditions reviewed and injected the plantar fascial bilateral 3 mg Kenalog 5 mg Xylocaine and scanned for a second pair of orthotics for different types of shoes

## 2016-04-13 DIAGNOSIS — J069 Acute upper respiratory infection, unspecified: Secondary | ICD-10-CM | POA: Diagnosis not present

## 2016-04-13 DIAGNOSIS — Z6830 Body mass index (BMI) 30.0-30.9, adult: Secondary | ICD-10-CM | POA: Diagnosis not present

## 2016-04-13 DIAGNOSIS — J310 Chronic rhinitis: Secondary | ICD-10-CM | POA: Diagnosis not present

## 2016-04-30 ENCOUNTER — Other Ambulatory Visit: Payer: BLUE CROSS/BLUE SHIELD

## 2016-05-08 DIAGNOSIS — F321 Major depressive disorder, single episode, moderate: Secondary | ICD-10-CM | POA: Diagnosis not present

## 2016-05-11 ENCOUNTER — Other Ambulatory Visit: Payer: Self-pay | Admitting: "Endocrinology

## 2016-05-15 ENCOUNTER — Encounter (HOSPITAL_COMMUNITY): Payer: BLUE CROSS/BLUE SHIELD

## 2016-05-17 ENCOUNTER — Telehealth: Payer: Self-pay | Admitting: Podiatry

## 2016-05-17 NOTE — Telephone Encounter (Signed)
Spoke to patient, she will call next week to schedule an appointment with Liliane Channel to pick up orthotics

## 2016-05-18 DIAGNOSIS — G4489 Other headache syndrome: Secondary | ICD-10-CM | POA: Diagnosis not present

## 2016-05-18 DIAGNOSIS — Z6830 Body mass index (BMI) 30.0-30.9, adult: Secondary | ICD-10-CM | POA: Diagnosis not present

## 2016-05-18 DIAGNOSIS — J3089 Other allergic rhinitis: Secondary | ICD-10-CM | POA: Diagnosis not present

## 2016-05-24 ENCOUNTER — Ambulatory Visit (INDEPENDENT_AMBULATORY_CARE_PROVIDER_SITE_OTHER): Payer: BLUE CROSS/BLUE SHIELD | Admitting: Podiatry

## 2016-05-24 ENCOUNTER — Encounter: Payer: Self-pay | Admitting: Podiatry

## 2016-05-24 DIAGNOSIS — Z96652 Presence of left artificial knee joint: Secondary | ICD-10-CM | POA: Diagnosis not present

## 2016-05-24 DIAGNOSIS — M1711 Unilateral primary osteoarthritis, right knee: Secondary | ICD-10-CM | POA: Diagnosis not present

## 2016-05-24 DIAGNOSIS — M722 Plantar fascial fibromatosis: Secondary | ICD-10-CM | POA: Diagnosis not present

## 2016-05-24 DIAGNOSIS — G609 Hereditary and idiopathic neuropathy, unspecified: Secondary | ICD-10-CM

## 2016-05-24 DIAGNOSIS — Z471 Aftercare following joint replacement surgery: Secondary | ICD-10-CM | POA: Diagnosis not present

## 2016-05-24 MED ORDER — TRIAMCINOLONE ACETONIDE 10 MG/ML IJ SUSP
10.0000 mg | Freq: Once | INTRAMUSCULAR | Status: AC
  Administered 2016-05-24: 10 mg

## 2016-05-24 NOTE — Patient Instructions (Signed)

## 2016-05-26 NOTE — Progress Notes (Signed)
Subjective:    Patient ID: Joyce Herrera, female   DOB: 56 y.o.   MRN: [id number]   HPI patient states my heels are hurting quite a bit and orthotics feel good    ROS      Objective:  Physical Exam Neurovascular status intact muscle strength adequate range of motion good with patient found to have exquisite discomfort plantar heel    Assessment:    Continued plantar fasciitis bilateral     Plan:    Reinjected the plantar fascia bilateral 3 mg Kenalog 5 mg Xylocaine and instructed on physical therapy supportive shoes and begin orthotics. Reappoint to recheck

## 2016-05-29 DIAGNOSIS — F321 Major depressive disorder, single episode, moderate: Secondary | ICD-10-CM | POA: Diagnosis not present

## 2016-05-31 DIAGNOSIS — G8929 Other chronic pain: Secondary | ICD-10-CM | POA: Diagnosis not present

## 2016-05-31 DIAGNOSIS — M1711 Unilateral primary osteoarthritis, right knee: Secondary | ICD-10-CM | POA: Diagnosis not present

## 2016-05-31 DIAGNOSIS — M25561 Pain in right knee: Secondary | ICD-10-CM | POA: Diagnosis not present

## 2016-06-07 DIAGNOSIS — M1711 Unilateral primary osteoarthritis, right knee: Secondary | ICD-10-CM | POA: Diagnosis not present

## 2016-06-26 DIAGNOSIS — F321 Major depressive disorder, single episode, moderate: Secondary | ICD-10-CM | POA: Diagnosis not present

## 2016-07-05 ENCOUNTER — Encounter: Payer: Self-pay | Admitting: Podiatry

## 2016-07-05 ENCOUNTER — Ambulatory Visit (INDEPENDENT_AMBULATORY_CARE_PROVIDER_SITE_OTHER): Payer: BLUE CROSS/BLUE SHIELD | Admitting: Podiatry

## 2016-07-05 DIAGNOSIS — M722 Plantar fascial fibromatosis: Secondary | ICD-10-CM

## 2016-07-05 DIAGNOSIS — L600 Ingrowing nail: Secondary | ICD-10-CM

## 2016-07-05 NOTE — Progress Notes (Signed)
Subjective:    Patient ID: Joyce Herrera, female   DOB: 56 y.o.   MRN: [id number]   HPI patient presents concerned about continued arch pain also in the right big toenail she wanted to have checked    ROS      Objective:  Physical Exam neurovascular status intact with patient having continued discomfort in the mid arch area bilateral that is reduced reduced from wear was previously but still present with significant cavus foot structure and healed nail site right hallux     Assessment:    Fasciitis-like symptoms and ingrown toenail right     Plan:    H&P conditions reviewed and do not recommend current treatment and do recommend a better type sandal for summer usage. Continue orthotics and other modalities we have utilized including night splint

## 2016-07-11 ENCOUNTER — Other Ambulatory Visit: Payer: Self-pay | Admitting: "Endocrinology

## 2016-07-17 ENCOUNTER — Ambulatory Visit (INDEPENDENT_AMBULATORY_CARE_PROVIDER_SITE_OTHER): Payer: BLUE CROSS/BLUE SHIELD | Admitting: Allergy & Immunology

## 2016-07-17 ENCOUNTER — Encounter: Payer: Self-pay | Admitting: Allergy & Immunology

## 2016-07-17 VITALS — BP 106/72 | HR 89 | Temp 98.4°F | Resp 20 | Ht 65.0 in | Wt 180.8 lb

## 2016-07-17 DIAGNOSIS — R51 Headache: Secondary | ICD-10-CM | POA: Diagnosis not present

## 2016-07-17 DIAGNOSIS — J4540 Moderate persistent asthma, uncomplicated: Secondary | ICD-10-CM

## 2016-07-17 DIAGNOSIS — R519 Headache, unspecified: Secondary | ICD-10-CM

## 2016-07-17 DIAGNOSIS — L230 Allergic contact dermatitis due to metals: Secondary | ICD-10-CM

## 2016-07-17 DIAGNOSIS — Z889 Allergy status to unspecified drugs, medicaments and biological substances status: Secondary | ICD-10-CM

## 2016-07-17 DIAGNOSIS — G8929 Other chronic pain: Secondary | ICD-10-CM

## 2016-07-17 DIAGNOSIS — J301 Allergic rhinitis due to pollen: Secondary | ICD-10-CM

## 2016-07-17 MED ORDER — ORALAIR 300 IR SL SUBL
1.0000 | SUBLINGUAL_TABLET | Freq: Once | SUBLINGUAL | 2 refills | Status: DC
Start: 2016-07-17 — End: 2016-10-17

## 2016-07-17 MED ORDER — FLUTICASONE FUROATE-VILANTEROL 100-25 MCG/INH IN AEPB: 1.0000 | INHALATION_SPRAY | Freq: Every day | RESPIRATORY_TRACT | 2 refills | Status: DC

## 2016-07-17 MED ORDER — ALBUTEROL SULFATE HFA 108 (90 BASE) MCG/ACT IN AERS: 4.0000 | INHALATION_SPRAY | Freq: Four times a day (QID) | RESPIRATORY_TRACT | 2 refills | Status: DC | PRN

## 2016-07-17 MED ORDER — AZELASTINE-FLUTICASONE 137-50 MCG/ACT NA SUSP: 2.0000 | Freq: Two times a day (BID) | NASAL | 5 refills | Status: DC

## 2016-07-17 NOTE — Progress Notes (Addendum)
NEW PATIENT  Date of Service/Encounter:  07/17/16  Referring provider: Redmond School, MD   Assessment:   Moderate persistent asthma, uncomplicated  Seasonal allergic rhinitis (grasses, trees, mold)  Chronic nonintractable headache  Contact dermatitis to metals  Multiple drug allergies   Asthma Reportables:  Severity: moderate persistent  Risk: high Control: not well controlled  Plan/Recommendations:   1. Moderate persistent asthma, uncomplicated - Lung function did show improvement with the nebulizer treatment, therefore you do have asthma. - We will start you on a once daily medication: Breo 100/25 - Daily controller medication(s): Breo 100/25 one puff daily - Rescue medications: albuterol 4 puffs every 4-6 hours as needed - Asthma control goals:  * Full participation in all desired activities (may need albuterol before activity) * Albuterol use two time or less a week on average (not counting use with activity) * Cough interfering with sleep two time or less a month * Oral steroids no more than once a year * No hospitalizations  2. Seasonal allergic rhinitis - Testing today showed: trees, grasses and molds - Avoidance measures provided. - Start Oralair one tablet daily and Dymista (fluticasone/azelastine) two sprays per nostril 1-2 times daily as needed - You can also use antihistamines as needed, although you have not done well with these in the past. - She is interested in sublingual allergy drops, which are not FDA approved and have questionable efficacy.  - However, Joyce Herrera is an approved oral immunotherapy that covers grass allergens.  - The Oralair would not cover the mold or the tree allergy.   3. GERD - Continue with Nexium twice daily.   4. Contact dermatitis to metals - Joyce Herrera is aware of the metals that she can and cannot tolerate.  - There is no indication for patch testing at this time.   5. Drug allergies - Avoid penicillins and  cephalosporins.   6. Return in about 2 months (around 09/16/2016).  Subjective:   Joyce Herrera is a 56 y.o. female presenting today for evaluation of  Chief Complaint  Patient presents with  . Cough  . Allergic Rhinitis   . Shortness of Breath  . Headache  . Nasal Congestion  . Pruritus    Joyce Herrera has a history of the following: Patient Active Problem List   Diagnosis Date Noted  . Seasonal allergic rhinitis due to pollen 07/17/2016  . Moderate persistent asthma, uncomplicated 50/09/3816  . Drug allergy 07/17/2016  . Chronic nonintractable headache 07/17/2016  . Allergic contact dermatitis due to metals 07/17/2016  . Class 1 obesity due to excess calories without serious comorbidity with body mass index (BMI) of 30.0 to 30.9 in adult 03/14/2016  . Family hx of colon cancer 09/16/2015  . History of colonic polyps 09/16/2015  . Subclinical hyperthyroidism 08/24/2015  . Hyperlipidemia 08/24/2015    History obtained from: chart review and patient.  Joyce Herrera was referred by Redmond School, MD.     Joyce Herrera is a 56 y.o. female presenting for shortness of breath and multiple other complaints. She is a history of hyperthyroidism as well as plantar fasciitis. She is a poor historian overall.   Shortness of breath: Joyce Herrera developed shortness of breath around two years ago. She has had barium swallow testing which has been normal, per the patient. She has had chest X-rays that have been normal. She was placed on Protonix 40mg  daily with minimal improvement; she remains on this medication today. She has never been on an ACE  inhibitor. She had a chest CT in August 2017 that shows atelectasis of the right lung base including the right middle lobe and right lobe. She has been on albuterol which does actually help with her symptoms. She has been on prednisone, which does possibly help her symptoms. She has been on both Proventil and ProAir. It does not seem that she has been  on a daily ICS/LABA for her symptoms, as she does not recognize any names of these. She says that the albuterol helps, but then "no one ever sends in refills". She has never been to the ED or hospitalized for her breathing. She tends to have symptoms intermittently without a clear pattern. Her shortness of breath does not occur on a daily basis. She was evaluated by Dr. Melida Quitter in August 2017 at which time she did have a flexible laryngoscopy procedure which was normal. However, there was evidence of laryngopharyngeal reflux. Her Protonix was increased to twice daily.   Allergic rhinitis: Joyce Herrera endorses ear "runniness" as well as facial and skin itching. This happens "sometimes, but not too often". She does endorse ocular itching. She was seen by an ophthalmologist at some point and was placed on drops which did help, although she does not remember the name of the drops. She does take Benadryl which does help with the symptoms. She does endorse nasal congestion every morning. She was tested years ago at [facility] and was on shots when she was much younger. At that time, her symptoms were less severe. She has tried antihistamines without improvement (Zyrtec, Allegra, Xyzal). She had behavior changes with Singulair, including bad dreams. She again does not remember what she as allergic to or how often she received injections. She is interested in sublingual allergy drops today. She did have a septoplasty performed by Dr. Erik Obey several years ago.   Presumed contact dermatitis: Joyce Herrera does get skin irritation from certain metals from jewelry. She can wear pure silver. She has figured out what to avoid and what she can tolerate.   Joyce Herrera is not too concerned with food allergies, although when I ask her about certain foods, I get vague responses such as "they make me feel bad" or the like. She does tolerate all of the major food allergens, from what I can gather. However, she does not eat peanut  butter ever and rarely eats tree nuts.   Joyce Herrera is followed by Dr. Dorris Fetch for subclinical hyperthyroidism. Blood work has been negative for hyperthyroidism in the past. She feels that the levels can fluctuate "over the course of minutes" and she overall feels that Dr. Dorris Fetch is "missing something". At some point, she was on a thyroid supplement", although this seems to be more a of a naturopathic treatment. She does have rashes with Keflex and penicillin, which were responsive to Benadryl. Otherwise, there is no history of other atopic diseases, including food allergies, stinging insect allergies, or urticaria. There is no significant infectious history. Vaccinations are up to date.    Past Medical History: Patient Active Problem List   Diagnosis Date Noted  . Seasonal allergic rhinitis due to pollen 07/17/2016  . Moderate persistent asthma, uncomplicated 97/98/9211  . Drug allergy 07/17/2016  . Chronic nonintractable headache 07/17/2016  . Allergic contact dermatitis due to metals 07/17/2016  . Class 1 obesity due to excess calories without serious comorbidity with body mass index (BMI) of 30.0 to 30.9 in adult 03/14/2016  . Family hx of colon cancer 09/16/2015  . History  of colonic polyps 09/16/2015  . Subclinical hyperthyroidism 08/24/2015  . Hyperlipidemia 08/24/2015    Medication List:  Allergies as of 07/17/2016      Reactions   Cephalexin Other (See Comments)   Pt stated, "Ears started to itch; hands and feet were itching"   Penicillins Rash      Medication List       Accurate as of 07/17/16  4:17 PM. Always use your most recent med list.          ALPRAZolam 0.5 MG tablet Commonly known as:  XANAX   Azelastine HCl 0.15 % Soln   butalbital-acetaminophen-caffeine 50-325-40 MG tablet Commonly known as:  FIORICET, ESGIC Reported on 03/23/2015   calcium carbonate 600 MG Tabs tablet Commonly known as:  OS-CAL Take 600 mg by mouth 2 (two) times daily with a meal.     ciprofloxacin 500 MG tablet Commonly known as:  CIPRO   diclofenac sodium 1 % Gel Commonly known as:  VOLTAREN   DULoxetine 60 MG capsule Commonly known as:  CYMBALTA Take 60 mg by mouth daily.   fluconazole 150 MG tablet Commonly known as:  DIFLUCAN   flurbiprofen 100 MG tablet Commonly known as:  ANSAID   gabapentin 300 MG capsule Commonly known as:  NEURONTIN   ibuprofen 200 MG tablet Commonly known as:  ADVIL,MOTRIN Take 200 mg by mouth every 6 (six) hours as needed. Reported on 03/23/2015   meloxicam 15 MG tablet Commonly known as:  MOBIC Take 15 mg by mouth daily.   NEXIUM 40 MG capsule Generic drug:  esomeprazole Take 1 capsule (40 mg total) by mouth daily before breakfast.   pregabalin 300 MG capsule Commonly known as:  LYRICA Take 1 capsule (300 mg total) by mouth 2 (two) times daily.   propranolol 40 MG tablet Commonly known as:  INDERAL TAKE (1/2) TABLET BY MOUTH AT BEDTIME.   ranitidine 300 MG tablet Commonly known as:  ZANTAC   zolpidem 10 MG tablet Commonly known as:  AMBIEN       Birth History: non-contributory.  Developmental History: non-contributory.   Past Surgical History: Past Surgical History:  Procedure Laterality Date  . ABDOMINAL HYSTERECTOMY    . CHOLECYSTECTOMY    . COLONOSCOPY  09/14/2010   Procedure: COLONOSCOPY;  Surgeon: Rogene Houston, MD;  [facility];  Service: Endoscopy;  Laterality: N/A;  . COLONOSCOPY N/A 01/11/2016   Procedure: COLONOSCOPY;  Surgeon: Rogene Houston, MD;  Location: [facility];  Service: Endoscopy;  Laterality: N/A;  930  . NASAL SEPTUM SURGERY    . PLANTAR FASCIECTOMY     bilateral  . POLYPECTOMY  01/11/2016   Procedure: POLYPECTOMY;  Surgeon: Rogene Houston, MD;  Location: [facility];  Service: Endoscopy;;  multiple colon polyps   . TONSILLECTOMY       Family History: Family History  Problem Relation Age of Onset  . Lung cancer Mother   . Kidney cancer Mother   .  Colon cancer Father   . Prostate cancer Father   . Diabetes Father   . Hypertension Father   . Kidney failure Father      Social History: Joyce Herrera lives at home with her husband. They live in a 57 year old home. There is one in the main living areas. There are no animals inside or outside of the home. They have gas heating and central cooling. There is no tobacco exposure. They do have dust mite coverings on the bedding.  Review of Systems: a 14-point review of systems is pertinent for what is mentioned in HPI.  Otherwise, all other systems were negative. Constitutional: negative other than that listed in the HPI Eyes: negative other than that listed in the HPI Ears, nose, mouth, throat, and face: negative other than that listed in the HPI Respiratory: negative other than that listed in the HPI Cardiovascular: negative other than that listed in the HPI Gastrointestinal: negative other than that listed in the HPI Genitourinary: negative other than that listed in the HPI Integument: negative other than that listed in the HPI Hematologic: negative other than that listed in the HPI Musculoskeletal: negative other than that listed in the HPI Neurological: negative other than that listed in the HPI Allergy/Immunologic: negative other than that listed in the HPI    Objective:   Blood pressure 106/72, pulse 89, temperature 98.4 F (36.9 C), resp. rate 20, height 5\' 5"  (1.651 m), weight 180 lb 12.8 oz (82 kg), last menstrual period 01/30/1992, SpO2 90 %. Body mass index is 30.09 kg/m.   Physical Exam:  General: Alert, in no acute distress. Somewhat unhappy and not very interactive.  Eyes: No conjunctival injection present on the right, No conjunctival injection present on the left, PERRL bilaterally, No discharge on the right, No discharge on the left and No Horner-Trantas dots present Ears: Right TM pearly gray with normal light reflex, Left TM pearly gray with normal light reflex,  Right TM intact without perforation and Left TM intact without perforation.  Nose/Throat: External nose within normal limits and septum midline, turbinates markedly edematous with clear discharge, post-pharynx erythematous with cobblestoning in the posterior oropharynx. Tonsils 2+ without exudates Neck: Supple without thyromegaly.  Adenopathy: Shoddy bilateral anterior cervical lymphadenopathy. and No enlarged lymph nodes appreciated in the occipital, axillary, epitrochlear, inguinal, or popliteal regions. Lungs: Clear to auscultation without wheezing, rhonchi or rales. No increased work of breathing. CV: Normal S1/S2, no murmurs. Capillary refill <2 seconds.  Abdomen: Nondistended, nontender. No guarding or rebound tenderness. Bowel sounds present in all fields and hyperactive  Skin: Warm and dry, without lesions or rashes. Extremities:  No clubbing, cyanosis or edema. Neuro:   Grossly intact. No focal deficits appreciated. Responsive to questions.  Diagnostic studies:  Spirometry: results abnormal (FEV1: 1.52/62%, FVC: 2.71/93%, FEV1/FVC: 56%).    Spirometry consistent with moderate obstructive disease. Albuterol nebulizer treatment given in clinic with significant improvement. There was a 24% improvement in the FVC and a 16% improvement in the FEV1. FVL appeared much more normal following the treatment.    Allergy Studies:    Indoor/Outdoor Percutaneous Adult Environmental Panel: negative to the entire panel with adequate controls.  Indoor/Outdoor Selected Intradermal Environmental Panel: positive to [employer], Grass mix, tree mix, mold mix #1 and cockroach. Otherwise negative with adequate controls.  Most Common Foods Panel (peanut, cashew, soy, fish mix, shellfish mix, wheat, milk, egg): negative to all with adequate controls   We did give the first dose of the Oralair in clinic today. She tolerated this well without any problems. She was monitored for 30 minutes.      Salvatore Marvel, MD [facility]

## 2016-07-17 NOTE — Patient Instructions (Addendum)
1. Moderate persistent asthma, uncomplicated - Lung function did show improvement with the nebulizer treatment, therefore you do have asthma. - We will start you on a once daily medication: Breo 100/25 - Daily controller medication(s): Breo 100/25 one puff daily - Rescue medications: albuterol 4 puffs every 4-6 hours as needed - Asthma control goals:  * Full participation in all desired activities (may need albuterol before activity) * Albuterol use two time or less a week on average (not counting use with activity) * Cough interfering with sleep two time or less a month * Oral steroids no more than once a year * No hospitalizations  2. Seasonal allergic rhinitis - Testing today showed: trees, grasses and molds - Avoidance measures provided. - Start Oralair one tablet daily and Dymista (fluticasone/azelastine) two sprays per nostril 1-2 times daily as needed - You can also use antihistamines as needed, although you have not done well with these in the past.   3. GERD - Continue with Nexium twice daily.   4. Return in about 2 months (around 09/16/2016).  Please inform us of any Emergency Department visits, hospitalizations, or changes in symptoms. Call us before going to the ED for breathing or allergy symptoms since we might be able to fit you in for a sick visit. Feel free to contact us anytime with any questions, problems, or concerns.  It was a pleasure to meet you today! Happy summer!   Websites that have reliable patient information: 1. American Academy of Asthma, Allergy, and Immunology: www.aaaai.org 2. Food Allergy Research and Education (FARE): foodallergy.org 3. Mothers of Asthmatics: http://www.asthmacommunitynetwork.org 4. American College of Allergy, Asthma, and Immunology: www.acaai.org  Reducing Pollen Exposure  The American Academy of Allergy, Asthma and Immunology suggests the following steps to reduce your exposure to pollen during allergy seasons.    1. Do not  hang sheets or clothing out to dry; pollen may collect on these items. 2. Do not mow lawns or spend time around freshly cut grass; mowing stirs up pollen. 3. Keep windows closed at night.  Keep car windows closed while driving. 4. Minimize morning activities outdoors, a time when pollen counts are usually at their highest. 5. Stay indoors as much as possible when pollen counts or humidity is high and on windy days when pollen tends to remain in the air longer. 6. Use air conditioning when possible.  Many air conditioners have filters that trap the pollen spores. 7. Use a HEPA room air filter to remove pollen form the indoor air you breathe.   Control of Mold Allergen  Mold and fungi can grow on a variety of surfaces provided certain temperature and moisture conditions exist.  Outdoor molds grow on plants, decaying vegetation and soil.  The major outdoor mold, Alternaria and Cladosporium, are found in very high numbers during hot and dry conditions.  Generally, a late Summer - Fall peak is seen for common outdoor fungal spores.  Rain will temporarily lower outdoor mold spore count, but counts rise rapidly when the rainy period ends.  The most important indoor molds are Aspergillus and Penicillium.  Dark, humid and poorly ventilated basements are ideal sites for mold growth.  The next most common sites of mold growth are the bathroom and the kitchen.  Outdoor [employer] 1. Use air conditioning and keep windows closed 2. Avoid exposure to decaying vegetation. 3. Avoid leaf raking. 4. Avoid grain handling. 5. Consider wearing a face mask if working in moldy areas.  Indoor Mold Control 1. Maintain  humidity below 50%. 2. Clean washable surfaces with 5% bleach solution. 3. Remove sources e.g. contaminated carpets.

## 2016-07-18 DIAGNOSIS — M1711 Unilateral primary osteoarthritis, right knee: Secondary | ICD-10-CM | POA: Diagnosis not present

## 2016-07-18 DIAGNOSIS — Z471 Aftercare following joint replacement surgery: Secondary | ICD-10-CM | POA: Diagnosis not present

## 2016-07-18 DIAGNOSIS — G8929 Other chronic pain: Secondary | ICD-10-CM | POA: Diagnosis not present

## 2016-07-18 DIAGNOSIS — M25561 Pain in right knee: Secondary | ICD-10-CM | POA: Diagnosis not present

## 2016-07-19 ENCOUNTER — Ambulatory Visit (INDEPENDENT_AMBULATORY_CARE_PROVIDER_SITE_OTHER): Payer: BLUE CROSS/BLUE SHIELD | Admitting: Adult Health

## 2016-07-19 ENCOUNTER — Telehealth: Payer: Self-pay | Admitting: Allergy & Immunology

## 2016-07-19 ENCOUNTER — Encounter: Payer: Self-pay | Admitting: Adult Health

## 2016-07-19 ENCOUNTER — Other Ambulatory Visit: Payer: Self-pay

## 2016-07-19 VITALS — BP 108/68 | HR 76 | Ht 65.0 in | Wt 180.0 lb

## 2016-07-19 DIAGNOSIS — N8111 Cystocele, midline: Secondary | ICD-10-CM | POA: Diagnosis not present

## 2016-07-19 DIAGNOSIS — R350 Frequency of micturition: Secondary | ICD-10-CM | POA: Diagnosis not present

## 2016-07-19 DIAGNOSIS — N952 Postmenopausal atrophic vaginitis: Secondary | ICD-10-CM

## 2016-07-19 DIAGNOSIS — N898 Other specified noninflammatory disorders of vagina: Secondary | ICD-10-CM

## 2016-07-19 LAB — POCT WET PREP (WET MOUNT)
Clue Cells Wet Prep Whiff POC: NEGATIVE
WBC, Wet Prep HPF POC: POSITIVE

## 2016-07-19 LAB — POCT URINALYSIS DIPSTICK
Glucose, UA: NEGATIVE
Ketones, UA: NEGATIVE
Leukocytes, UA: NEGATIVE
NITRITE UA: NEGATIVE
PROTEIN UA: NEGATIVE
RBC UA: NEGATIVE

## 2016-07-19 MED ORDER — ORALAIR 300 IR SL SUBL: 1.0000 | SUBLINGUAL_TABLET | Freq: Every day | SUBLINGUAL | 5 refills | Status: DC

## 2016-07-19 MED ORDER — ESTROGENS, CONJUGATED 0.625 MG/GM VA CREA: TOPICAL_CREAM | VAGINAL | 0 refills | Status: DC

## 2016-07-19 MED ORDER — AZELASTINE HCL 0.1 % NA SOLN
2.0000 | Freq: Two times a day (BID) | NASAL | 2 refills | Status: DC | PRN
Start: 2016-07-19 — End: 2016-10-23

## 2016-07-19 MED ORDER — MIRABEGRON ER 25 MG PO TB24: 25.0000 mg | ORAL_TABLET | Freq: Every day | ORAL | 3 refills | Status: DC

## 2016-07-19 MED ORDER — FLUTICASONE PROPIONATE 50 MCG/ACT NA SUSP: 2.0000 | Freq: Two times a day (BID) | NASAL | 2 refills | Status: DC | PRN

## 2016-07-19 NOTE — Telephone Encounter (Signed)
Patient is calling with some concerns about her medications Patient wants to know if her ORAL AIR has been called in to a mail order pharmacy because her pharamacy states that the script is not there.  Also patient asked about her DYMISTA and the pharmacy was going to give her some FLONASE and another medication, and the patient was not happy because she thought she was to get the one medication not several Patient uses [facility]  Please call patient to clarify medications and where that were called into

## 2016-07-19 NOTE — Addendum Note (Signed)
Addended by: Clydene Laming, Dareth Andrew L on: 07/19/2016 03:20 PM   Modules accepted: Orders

## 2016-07-19 NOTE — Progress Notes (Signed)
Subjective:     Patient ID: Joyce Herrera, female   DOB: 27-Jul-1960, 56 y.o.   MRN: [id number]  HPI Joyce Herrera is a 56 year old white female, married in complaining of vaginal irritation and urinary frequency, has vodied 3 times since got to office.She tried OTC monistat and diflucan without relief. She is sp hysterectomy and is not sexually active.   Review of Systems Vaginal irritation  Urinary frequency Reviewed past medical,surgical, social and family history. Reviewed medications and allergies.     Objective:   Physical Exam BP 108/68 (BP Location: Right Arm, Patient Position: Sitting, Cuff Size: Normal)   Pulse 76   Ht 5\' 5"  (1.651 m)   Wt 180 lb (81.6 kg)   LMP 01/30/1992   BMI 29.95 kg/m urine dipstick is negative.Skin warm and dry.Pelvic: external genitalia is normal in appearance no lesions, vagina: dry and slightly red,urethra has no lesions or masses noted, cervix and uterus are absent, has cystocele, adnexa: no masses or tenderness noted. Bladder is non tender and no masses felt. Wet prep:  +WBCs. Discussed that vaginal tissues have thinned and dried out, no infection seen, will try Premarin vaginal cream to see if helps and also myrbetriq.     Assessment:     1. Atrophic vaginitis   2. Pelvic relaxation due to cystocele, midline   3. Urinary frequency   4. Vaginal irritation       Plan:     Rx myrbetriq 25 mg #30 take 1 daily with 3 refills Given 6 tubes, 24 gm of Premarin Vaginal Cream, use 0.5 gm in vagina at hs for 2 weeks then 2-3 x weekly Follow up in 4 weeks

## 2016-07-19 NOTE — Telephone Encounter (Signed)
Pt called back stating that [facility] does not dispense Oralair. I told the pt that I will call her back and investigate this.  I called [facility]-Oralair rep and got a VM. I called the office number on the business card and spoke with [facility]. She took a message to pass onto a rep to call us back. She states that she was unaware of [facility] not dispensing this medication.   I sent the new Oralair rx to [facility]. [facility]   Called pt back to make her aware of the pharmacy change. I also provided her with the phone number to reach [facility].

## 2016-07-19 NOTE — Telephone Encounter (Signed)
Split Dymista rx for insurance coverage.

## 2016-07-19 NOTE — Telephone Encounter (Signed)
Called pt back. Made pt aware that we used [facility] for Oralair coverage. Gave pt the phone number to the pharmacy.   Also made pt aware that the Dymista was split up due to insurance coverage.

## 2016-07-20 ENCOUNTER — Telehealth: Payer: Self-pay | Admitting: *Deleted

## 2016-07-20 ENCOUNTER — Ambulatory Visit (HOSPITAL_COMMUNITY): Payer: BLUE CROSS/BLUE SHIELD | Attending: Orthopedic Surgery | Admitting: Physical Therapy

## 2016-07-20 DIAGNOSIS — M6281 Muscle weakness (generalized): Secondary | ICD-10-CM

## 2016-07-20 DIAGNOSIS — R600 Localized edema: Secondary | ICD-10-CM | POA: Diagnosis not present

## 2016-07-20 DIAGNOSIS — G8929 Other chronic pain: Secondary | ICD-10-CM | POA: Diagnosis not present

## 2016-07-20 DIAGNOSIS — M25562 Pain in left knee: Secondary | ICD-10-CM | POA: Insufficient documentation

## 2016-07-20 DIAGNOSIS — H35371 Puckering of macula, right eye: Secondary | ICD-10-CM | POA: Diagnosis not present

## 2016-07-20 DIAGNOSIS — H5203 Hypermetropia, bilateral: Secondary | ICD-10-CM | POA: Diagnosis not present

## 2016-07-20 DIAGNOSIS — H524 Presbyopia: Secondary | ICD-10-CM | POA: Diagnosis not present

## 2016-07-20 DIAGNOSIS — H52223 Regular astigmatism, bilateral: Secondary | ICD-10-CM | POA: Diagnosis not present

## 2016-07-20 NOTE — Patient Instructions (Addendum)
Stretching: Hamstring (Supine)    Supporting right thigh behind knee, slowly straighten knee until stretch is felt in back of thigh. Hold __30__ seconds. Repeat __3__ times per set. Do ____ sets per session. Do _2___ sessions per day. 1 http://orth.exer.us/656   Copyright  VHI. All rights reserved.  Stretching: Tensor    Cross right leg over the other, then lean to same side until stretch is felt on other hip. Hold __15__ seconds. Repeat _3___ times per set. Do 1____ sets per session. Do __2__ sessions per day.  http://orth.exer.us/652   Copyright  VHI. All rights reserved.

## 2016-07-20 NOTE — Therapy (Addendum)
[place], [place], [address] Phone: [phone]   Fax:  [phone]  Physical Therapy Evaluation  Patient Details  Name: Joyce Herrera MRN: [id number] Date of Birth: 10-Jan-1961 Referring Provider: Dr. Paralee Cancel  Encounter Date: 07/20/2016      PT End of Session - 07/20/16 1429    Visit Number 1   Number of Visits 12   Date for PT Re-Evaluation 08/19/16   Authorization Type BCBS   Authorization - Visit Number 1   Authorization - Number of Visits 10   PT Start Time 0300   PT Stop Time 1425   PT Time Calculation (min) 40 min   Activity Tolerance Patient tolerated treatment well      Past Medical History:  Diagnosis Date  . Headache(784.0)   . History of colon polyps   . Hypothyroidism   . PONV (postoperative nausea and vomiting)   . Seasonal allergies     Past Surgical History:  Procedure Laterality Date  . ABDOMINAL HYSTERECTOMY    . CHOLECYSTECTOMY    . COLONOSCOPY  09/14/2010   Procedure: COLONOSCOPY;  Surgeon: Rogene Houston, MD;  [facility];  Service: Endoscopy;  Laterality: N/A;  . COLONOSCOPY N/A 01/11/2016   Procedure: COLONOSCOPY;  Surgeon: Rogene Houston, MD;  [facility];  Service: Endoscopy;  Laterality: N/A;  930  . MEDIAL PARTIAL KNEE REPLACEMENT    . NASAL SEPTUM SURGERY    . PLANTAR FASCIECTOMY     bilateral  . POLYPECTOMY  01/11/2016   Procedure: POLYPECTOMY;  Surgeon: Rogene Houston, MD;  [facility];  Service: Endoscopy;;  multiple colon polyps   . TONSILLECTOMY      There were no vitals filed for this visit.       Subjective Assessment - 07/20/16 1352    Subjective Joyce Herrera states that she had a partial knee replacment on 08/2015 and has had difficulty ever since.  She states that she has the majority of her pain on the inside of her knee.  She has been icing but she is not getting any relief of pain.     Pertinent History  unremarkable    How long can you sit comfortably? no longer than an hour.    How long can you stand comfortably? less than ten minutes   How long can you walk comfortably? 20 minutes    Patient Stated Goals sit, stand and walk longer, sleep better,( can't get to sleep without taking medication and then wakes up after four hours.)   Currently in Pain? Yes   Pain Score 5   highest pain is a 9/10    Pain Location Knee   Pain Orientation Left   Pain Descriptors / Indicators Burning;Tightness   Pain Type Chronic pain   Pain Onset More than a month ago   Aggravating Factors  activity   Pain Relieving Factors ice, medication    Effect of Pain on Daily Activities increases             [facility] PT Assessment - 07/20/16 0001      Assessment   Medical Diagnosis Lt partial reconstruction    Referring Provider Dr. Paralee Cancel   Onset Date/Surgical Date 09/28/15   Next MD Visit not scheduled    Prior Therapy After surgery      Precautions   Precautions None     Restrictions   Weight Bearing Restrictions No  Balance Screen   Has the patient fallen in the past 6 months No   Has the patient had a decrease in activity level because of a fear of falling?  Yes   Is the patient reluctant to leave their home because of a fear of falling?  No     Prior Function   Level of Independence Independent   Vocation Unemployed   Leisure walking, yard work      Observation/Other Assessments   Focus on Therapeutic Outcomes (FOTO)  53     Functional Tests   Functional tests Single leg stance;Sit to Stand     Single Leg Stance   Comments Lt 50; RT 36     Sit to Stand   Comments 5x 10.16     AROM   Left Knee Extension 0   Left Knee Flexion 135     Strength   Right Hip Flexion 5/5   Right Hip Extension 5/5   Right Hip ABduction 4/5   Left Hip Flexion 5/5   Left Hip ABduction 5/5   Right Knee Extension 5/5   Left Knee Flexion 4/5   Left Knee Extension 5/5   Right Ankle Dorsiflexion  5/5   Left Ankle Dorsiflexion 5/5     Flexibility   Soft Tissue Assessment /Muscle Length yes   Hamstrings Lt 160; RT 165            Objective measurements completed on examination: See above findings.          [facility] Adult PT Treatment/Exercise - 07/20/16 0001      Exercises   Exercises Knee/Hip     Knee/Hip Exercises: Stretches   Active Hamstring Stretch Left;3 reps;30 seconds     Modalities   Modalities Iontophoresis     Iontophoresis   Type of Iontophoresis Dexamethasone   Location medial knee LT    Dose 47m-A/min   Time 6 hr   place and go                 PT Education - 07/20/16 1429    Education provided Yes   Education Details stretches; explained the benefit of iontophoresis   Person(s) Educated Patient   Methods Explanation   Comprehension Verbalized understanding          PT Short Term Goals - 07/20/16 1629      PT SHORT TERM GOAL #1   Title Pt Pain in her left knee to be no greater than a 5/10 to allow pt to sleep for 6 hours.    Time 2   Period Weeks   Status New     PT SHORT TERM GOAL #2   Title Pt to be able to walk for 30 minutes in comfort to be able to complete shopping in comfort    Time 2   Period Weeks   Status New     PT SHORT TERM GOAL #3   Title Pt to be able to sit for up to 90 minutes for traveling    Time 2   Period Weeks   Status New     PT SHORT TERM GOAL #4   Title Pt to be able to stand with equal weight bearing for 20 minutes to  make a meal    Time 2   Period Weeks   Status New           PT Long Term Goals - 07/20/16 1631      PT LONG TERM GOAL #1  Title Pt pain in her left knee to be no greater than a 2/10 to allow pt to sleep 7 hours    Time 4   Period Weeks     PT LONG TERM GOAL #2   Title Pt to be able to walk for up to 45 minutes for shopping/ recreational activity.    Time 4   Period Weeks   Status New     PT LONG TERM GOAL #3   Title Pt to be able to go up and down  4 steps  in a reciprocal manner in comfort    Time 4   Period Weeks   Status New     PT LONG TERM GOAL #4   Title Pt to be able to squat down to the ground to work on gardening tasks/ pick up items off the floor.    Time 4   Period Weeks   Status New                Plan - 07/20/16 1623    Clinical Impression Statement Joyce Herrera is a 56 yo female who had a partial knee replacement on 09/28/2015 and has had difficulty ever since.  She has been referred to skilled physical therapy for evaluation and treatment to include iontophoresis, hamstring and tensor stretching.  Evaluation demonstrates decreased hip extensor strength, localized edema, , and  increased pain.  Joyce Herrera will benefit from skilled physical therapy to concentrate on her inflammation, pain and to improve her functional ability.    Clinical Presentation Stable   Clinical Decision Making Moderate   Rehab Potential Good   PT Frequency 3x / week   PT Duration 4 weeks   PT Treatment/Interventions ADLs/Self Care Home Management;Cryotherapy;Gait training;Stair training;Iontophoresis 4mg /ml Dexamethasone;Therapeutic exercise;Therapeutic activities;Functional mobility training;Manual techniques   PT Next Visit Plan begin patella mobs, rockerboard, knee flexion, terminal extension both standing and supine and squats.  Continue with iontophoresis.     PT Home Exercise Plan Hamsting and IT band stretch.       Patient will benefit from skilled therapeutic intervention in order to improve the following deficits and impairments:  Abnormal gait, Decreased activity tolerance, Pain, Difficulty walking, Decreased strength, Increased edema  Visit Diagnosis: Muscle weakness (generalized)  Localized edema  Chronic pain of left knee     Problem List Patient Active Problem List   Diagnosis Date Noted  . Vaginal irritation 07/19/2016  . Urinary frequency 07/19/2016  . Pelvic relaxation due to cystocele, midline 07/19/2016  .  Atrophic vaginitis 07/19/2016  . Seasonal allergic rhinitis due to pollen 07/17/2016  . Moderate persistent asthma, uncomplicated 78/58/8502  . Drug allergy 07/17/2016  . Chronic nonintractable headache 07/17/2016  . Allergic contact dermatitis due to metals 07/17/2016  . Class 1 obesity due to excess calories without serious comorbidity with body mass index (BMI) of 30.0 to 30.9 in adult 03/14/2016  . Family hx of colon cancer 09/16/2015  . History of colonic polyps 09/16/2015  . Subclinical hyperthyroidism 08/24/2015  . Hyperlipidemia 08/24/2015  Joyce Herrera, PT CLT [phone] 07/20/2016, 4:38 PM  [facility] [address] [place], [place], [address] Phone: [phone]   Fax:  [phone]  Name: CARRELL PALMATIER MRN: [id number] Date of Birth: 17-Aug-1960

## 2016-07-24 ENCOUNTER — Encounter: Payer: BLUE CROSS/BLUE SHIELD | Admitting: Women's Health

## 2016-07-24 ENCOUNTER — Ambulatory Visit (HOSPITAL_COMMUNITY): Payer: BLUE CROSS/BLUE SHIELD | Admitting: Physical Therapy

## 2016-07-24 ENCOUNTER — Telehealth: Payer: Self-pay

## 2016-07-24 DIAGNOSIS — M25562 Pain in left knee: Secondary | ICD-10-CM | POA: Diagnosis not present

## 2016-07-24 DIAGNOSIS — R600 Localized edema: Secondary | ICD-10-CM

## 2016-07-24 DIAGNOSIS — M6281 Muscle weakness (generalized): Secondary | ICD-10-CM

## 2016-07-24 DIAGNOSIS — G8929 Other chronic pain: Secondary | ICD-10-CM | POA: Diagnosis not present

## 2016-07-24 NOTE — Telephone Encounter (Signed)
Pt called about some concerns regarding her Oralair.   As stated in a previous telephone encounter, [facility] stated to the pt that they do not dispense Oralair any longer. Shannon-Oralair rep-looked into this claim. She got back with me on Monday (07/23/16) stating that their contract was still valid with [facility]. She stated that the pt needed a PA on Oralair and it was being faxed to the office.   When speaking with the pt, I apologized to her stating that the faxes were being sent to the [place] office and we are only there on Tuesday's. I asked Lurena Joiner to fax over the PA requests that were sent.  PA was completed this morning and pt was advised of the progress. I also advised pt to go by office to pick up some more Oralair samples so that she did not run out.

## 2016-07-24 NOTE — Therapy (Signed)
[place], [place], [address] Phone: [phone]   Fax:  [phone]  Physical Therapy Treatment  Patient Details  Name: Joyce Herrera MRN: [id number] Date of Birth: [birth year] Referring Provider: Dr. Paralee Cancel  Encounter Date: 07/24/2016      PT End of Session - 07/24/16 1714    Visit Number 2   Number of Visits 12   Date for PT Re-Evaluation 08/19/16   Authorization Type BCBS   Authorization - Visit Number 2   Authorization - Number of Visits 10   PT Start Time 6222   PT Stop Time 1655   PT Time Calculation (min) 47 min   Activity Tolerance Patient tolerated treatment well      Past Medical History:  Diagnosis Date  . Headache(784.0)   . History of colon polyps   . Hypothyroidism   . PONV (postoperative nausea and vomiting)   . Seasonal allergies     Past Surgical History:  Procedure Laterality Date  . ABDOMINAL HYSTERECTOMY    . CHOLECYSTECTOMY    . COLONOSCOPY  09/14/2010   Procedure: COLONOSCOPY;  Surgeon: Rogene Houston, MD;  [facility];  Service: Endoscopy;  Laterality: N/A;  . COLONOSCOPY N/A 01/11/2016   Procedure: COLONOSCOPY;  Surgeon: Rogene Houston, MD;  [facility];  Service: Endoscopy;  Laterality: N/A;  930  . MEDIAL PARTIAL KNEE REPLACEMENT    . NASAL SEPTUM SURGERY    . PLANTAR FASCIECTOMY     bilateral  . POLYPECTOMY  01/11/2016   Procedure: POLYPECTOMY;  Surgeon: Rogene Houston, MD;  [facility];  Service: Endoscopy;;  multiple colon polyps   . TONSILLECTOMY      There were no vitals filed for this visit.      Subjective Assessment - 07/24/16 1613    Subjective PT states her knee did not hurt as long as had the patch on yesterday.  STates she did feel worse the next day as she did too much while wearing it.     Currently in Pain? Yes   Pain Score 7    Pain Location Knee   Pain Orientation Left   Pain Descriptors / Indicators  Burning;Tightness                         [facility] Adult PT Treatment/Exercise - 07/24/16 0001      Knee/Hip Exercises: Stretches   Active Hamstring Stretch Left;3 reps;30 seconds     Knee/Hip Exercises: Standing   Knee Flexion Left;10 reps   Terminal Knee Extension Limitations L TKE with green thera-band x 2 sets of 10 reps   Wall Squat 1 set;10 reps   Rocker Board 2 minutes   Rocker Board Limitations 10 reps A/P and Rt/Lt     Knee/Hip Exercises: Supine   Quad Sets Left;10 reps   Straight Leg Raises Left;5 reps     Modalities   Modalities Iontophoresis     Iontophoresis   Type of Iontophoresis Dexamethasone   Location medial knee LT    Dose 106m-A/min   Time 6 hr      Manual Therapy   Manual Therapy Soft tissue mobilization   Manual therapy comments Completed separately from ther ex   Joint Mobilization medial knee                PT Education - 07/24/16 1713    Education provided Yes  Education Details reviewed HEP and goals per intial evaluation.  Educated on wear time and mechanism of action for iontophoresis.  Explained we normally do 6 treatments only    Person(s) Educated Patient   Methods Explanation;Demonstration;Tactile cues;Verbal cues;Handout   Comprehension Verbalized understanding;Returned demonstration;Verbal cues required;Tactile cues required          PT Short Term Goals - 07/20/16 1629      PT SHORT TERM GOAL #1   Title Pt Pain in her left knee to be no greater than a 5/10 to allow pt to sleep for 6 hours.    Time 2   Period Weeks   Status New     PT SHORT TERM GOAL #2   Title Pt to be able to walk for 30 minutes in comfort to be able to complete shopping in comfort    Time 2   Period Weeks   Status New     PT SHORT TERM GOAL #3   Title Pt to be able to sit for up to 90 minutes for traveling    Time 2   Period Weeks   Status New     PT SHORT TERM GOAL #4   Title Pt to be able to stand with equal weight bearing  for 20 minutes to  make a meal    Time 2   Period Weeks   Status New           PT Long Term Goals - 07/20/16 1631      PT LONG TERM GOAL #1   Title Pt pain in her left knee to be no greater than a 2/10 to allow pt to sleep 7 hours    Time 4   Period Weeks     PT LONG TERM GOAL #2   Title Pt to be able to walk for up to 45 minutes for shopping/ recreational activity.    Time 4   Period Weeks   Status New     PT LONG TERM GOAL #3   Title Pt to be able to go up and down  4 steps in a reciprocal manner in comfort    Time 4   Period Weeks   Status New     PT LONG TERM GOAL #4   Title Pt to be able to squat down to the ground to work on gardening tasks/ pick up items off the floor.    Time 4   Period Weeks   Status New               Plan - 07/24/16 1715    Clinical Impression Statement reviewed HEP and goals per intial evaluation.  pt independent with stretches given for HEP.  Progressed with additional exercises as instructed in evaluating therapists POC.  Also initated trigger point therapy to medial knee to help relieve the discomfort. Patellar mobility is good at this point.   Ionto patch placed on medial knee with patient verbalizing understanding to remove in 6 hours.     Rehab Potential Good   PT Frequency 3x / week   PT Duration 4 weeks   PT Treatment/Interventions ADLs/Self Care Home Management;Cryotherapy;Gait training;Stair training;Iontophoresis 4mg /ml Dexamethasone;Therapeutic exercise;Therapeutic activities;Functional mobility training;Manual techniques   PT Next Visit Plan Update HEP next session.  Continue to progress towards goals.    Continue with iontophoresis treatment.      PT Home Exercise Plan Hamsting and IT band stretch.       Patient will benefit from skilled  therapeutic intervention in order to improve the following deficits and impairments:  Abnormal gait, Decreased activity tolerance, Pain, Difficulty walking, Decreased strength, Increased  edema  Visit Diagnosis: Muscle weakness (generalized)  Localized edema  Chronic pain of left knee     Problem List Patient Active Problem List   Diagnosis Date Noted  . Vaginal irritation 07/19/2016  . Urinary frequency 07/19/2016  . Pelvic relaxation due to cystocele, midline 07/19/2016  . Atrophic vaginitis 07/19/2016  . Seasonal allergic rhinitis due to pollen 07/17/2016  . Moderate persistent asthma, uncomplicated 22/97/9892  . Drug allergy 07/17/2016  . Chronic nonintractable headache 07/17/2016  . Allergic contact dermatitis due to metals 07/17/2016  . Class 1 obesity due to excess calories without serious comorbidity with body mass index (BMI) of 30.0 to 30.9 in adult 03/14/2016  . Family hx of colon cancer 09/16/2015  . History of colonic polyps 09/16/2015  . Subclinical hyperthyroidism 08/24/2015  . Hyperlipidemia 08/24/2015    Teena Irani, PTA/CLT [phone]  07/24/2016, 5:31 PM  [facility] [address] [place], [place], [address] Phone: [phone]   Fax:  [phone]  Name: Joyce Herrera MRN: [id number] Date of Birth: 01-12-1961

## 2016-07-25 ENCOUNTER — Ambulatory Visit (HOSPITAL_COMMUNITY): Payer: BLUE CROSS/BLUE SHIELD | Admitting: Physical Therapy

## 2016-07-25 ENCOUNTER — Telehealth: Payer: Self-pay | Admitting: Adult Health

## 2016-07-25 DIAGNOSIS — M25562 Pain in left knee: Secondary | ICD-10-CM

## 2016-07-25 DIAGNOSIS — G8929 Other chronic pain: Secondary | ICD-10-CM | POA: Diagnosis not present

## 2016-07-25 DIAGNOSIS — M6281 Muscle weakness (generalized): Secondary | ICD-10-CM | POA: Diagnosis not present

## 2016-07-25 DIAGNOSIS — R600 Localized edema: Secondary | ICD-10-CM | POA: Diagnosis not present

## 2016-07-25 MED ORDER — OXYBUTYNIN CHLORIDE ER 10 MG PO TB24: 10.0000 mg | ORAL_TABLET | Freq: Every day | ORAL | 3 refills | Status: DC

## 2016-07-25 NOTE — Telephone Encounter (Signed)
Pt aware her [employer] will not cover myrbetriq til she has 2 failed generic meds, will Rx oxybutynin

## 2016-07-25 NOTE — Therapy (Signed)
[place], [place], [address] Phone: [phone]   Fax:  [phone]  Physical Therapy Treatment  Patient Details  Name: Joyce Herrera MRN: [id number] Date of Birth: [birth year] Referring Provider: Dr. Paralee Cancel  Encounter Date: 07/25/2016      PT End of Session - 07/25/16 1115    Visit Number 3   Number of Visits 12   Date for PT Re-Evaluation 08/19/16   Authorization Type BCBS   Authorization - Visit Number 3   Authorization - Number of Visits 10   PT Start Time 2774   PT Stop Time 1118   PT Time Calculation (min) 38 min   Activity Tolerance Patient tolerated treatment well      Past Medical History:  Diagnosis Date  . Headache(784.0)   . History of colon polyps   . Hypothyroidism   . PONV (postoperative nausea and vomiting)   . Seasonal allergies     Past Surgical History:  Procedure Laterality Date  . ABDOMINAL HYSTERECTOMY    . CHOLECYSTECTOMY    . COLONOSCOPY  09/14/2010   Procedure: COLONOSCOPY;  Surgeon: Rogene Houston, MD;  [facility];  Service: Endoscopy;  Laterality: N/A;  . COLONOSCOPY N/A 01/11/2016   Procedure: COLONOSCOPY;  Surgeon: Rogene Houston, MD;  [facility];  Service: Endoscopy;  Laterality: N/A;  930  . MEDIAL PARTIAL KNEE REPLACEMENT    . NASAL SEPTUM SURGERY    . PLANTAR FASCIECTOMY     bilateral  . POLYPECTOMY  01/11/2016   Procedure: POLYPECTOMY;  Surgeon: Rogene Houston, MD;  [facility];  Service: Endoscopy;;  multiple colon polyps   . TONSILLECTOMY      There were no vitals filed for this visit.      Subjective Assessment - 07/25/16 1042    Subjective Pt states that she overdid after the first patch because she was feeling so well but she is doing better now.   Pertinent History unremarkable    How long can you sit comfortably? no longer than an hour.    How long can you stand comfortably? less than ten minutes   How long can you walk comfortably? 20 minutes    Patient Stated Goals sit, stand and walk longer, sleep better,( can't get to sleep without taking medication and then wakes up after four hours.)   Currently in Pain? Yes   Pain Score 5    Pain Location Knee   Pain Orientation Left;Medial   Pain Descriptors / Indicators Aching   Pain Type Chronic pain   Pain Onset More than a month ago                         [facility] Adult PT Treatment/Exercise - 07/25/16 0001      Exercises   Exercises Knee/Hip     Knee/Hip Exercises: Stretches   Active Hamstring Stretch Left;3 reps;30 seconds     Knee/Hip Exercises: Standing   Heel Raises Both;10 reps   Knee Flexion Strengthening;Left;10 reps   Knee Flexion Limitations 3   Forward Lunges Both;10 reps   Terminal Knee Extension Limitations 10   Wall Squat 1 set;10 reps   Rocker Board 2 minutes   SLS x3     Knee/Hip Exercises: Supine   Quad Sets 15 reps   Terminal Knee Extension Left;15 reps     Knee/Hip Exercises: Sidelying   Hip ABduction Strengthening;Left;10  reps     Knee/Hip Exercises: Prone   Hip Extension 10 reps     Modalities   Modalities Iontophoresis     Iontophoresis   Type of Iontophoresis Dexamethasone   Location medial knee LT    Dose 61m-A/min   Time 6 hr      Manual Therapy   Manual Therapy Soft tissue mobilization   Manual therapy comments Completed separately from ther ex   Joint Mobilization medial knee                PT Education - 07/24/16 1713    Education provided Yes   Education Details reviewed HEP and goals per intial evaluation.  Educated on wear time and mechanism of action for iontophoresis.  Explained we normally do 6 treatments only    Person(s) Educated Patient   Methods Explanation;Demonstration;Tactile cues;Verbal cues;Handout   Comprehension Verbalized understanding;Returned demonstration;Verbal cues required;Tactile cues required          PT Short Term Goals -  07/25/16 1125      PT SHORT TERM GOAL #1   Title Pt Pain in her left knee to be no greater than a 5/10 to allow pt to sleep for 6 hours.    Time 2   Period Weeks   Status On-going     PT SHORT TERM GOAL #2   Title Pt to be able to walk for 30 minutes in comfort to be able to complete shopping in comfort    Time 2   Period Weeks   Status On-going     PT SHORT TERM GOAL #3   Title Pt to be able to sit for up to 90 minutes for traveling    Time 2   Period Weeks   Status On-going     PT SHORT TERM GOAL #4   Title Pt to be able to stand with equal weight bearing for 20 minutes to  make a meal    Time 2   Period Weeks   Status On-going           PT Long Term Goals - 07/25/16 1125      PT LONG TERM GOAL #1   Title Pt pain in her left knee to be no greater than a 2/10 to allow pt to sleep 7 hours    Time 4   Period Weeks   Status On-going     PT LONG TERM GOAL #2   Title Pt to be able to walk for up to 45 minutes for shopping/ recreational activity.    Time 4   Period Weeks   Status On-going     PT LONG TERM GOAL #3   Title Pt to be able to go up and down  4 steps in a reciprocal manner in comfort    Time 4   Period Weeks   Status On-going     PT LONG TERM GOAL #4   Title Pt to be able to squat down to the ground to work on gardening tasks/ pick up items off the floor.    Time 4   Period Weeks   Status On-going               Plan - 07/25/16 1123    Clinical Impression Statement Pt has 3rd ionto treatment.  Added lunging and hip abduction/extension exercises.  Patella has decreased inferior/superior motion.    Rehab Potential Good   PT Frequency 3x / week   PT Duration 4 weeks  PT Treatment/Interventions ADLs/Self Care Home Management;Cryotherapy;Gait training;Stair training;Iontophoresis 4mg /ml Dexamethasone;Therapeutic exercise;Therapeutic activities;Functional mobility training;Manual techniques   PT Next Visit Plan Give pt self mobilization for  inferior/ superior patellar mobs sheet.     Continue with iontophoresis treatment.      PT Home Exercise Plan Hamsting and IT band stretch.       Patient will benefit from skilled therapeutic intervention in order to improve the following deficits and impairments:  Abnormal gait, Decreased activity tolerance, Pain, Difficulty walking, Decreased strength, Increased edema  Visit Diagnosis: Muscle weakness (generalized)  Localized edema  Chronic pain of left knee     Problem List Patient Active Problem List   Diagnosis Date Noted  . Vaginal irritation 07/19/2016  . Urinary frequency 07/19/2016  . Pelvic relaxation due to cystocele, midline 07/19/2016  . Atrophic vaginitis 07/19/2016  . Seasonal allergic rhinitis due to pollen 07/17/2016  . Moderate persistent asthma, uncomplicated 59/16/3846  . Drug allergy 07/17/2016  . Chronic nonintractable headache 07/17/2016  . Allergic contact dermatitis due to metals 07/17/2016  . Class 1 obesity due to excess calories without serious comorbidity with body mass index (BMI) of 30.0 to 30.9 in adult 03/14/2016  . Family hx of colon cancer 09/16/2015  . History of colonic polyps 09/16/2015  . Subclinical hyperthyroidism 08/24/2015  . Hyperlipidemia 08/24/2015   Rayetta Humphrey, PT CLT [phone] 07/25/2016, 11:26 AM  [facility] [address] [place], [place], [address] Phone: [phone]   Fax:  [phone]  Name: Joyce Herrera MRN: [id number] Date of Birth: 02-21-1960

## 2016-07-27 ENCOUNTER — Telehealth: Payer: Self-pay

## 2016-07-27 NOTE — Telephone Encounter (Signed)
Patient called and stated that the pharmacy would be calling us from [phone]. Patient stated that [facility] is not accepting new patients. Then patient stated that [facility] has the medication on hand and wants the prescription set there. I explained to the patient that we are working on it and as soon as we get an update we will follow up with her.

## 2016-07-27 NOTE — Telephone Encounter (Signed)
Erroneous entry

## 2016-07-30 ENCOUNTER — Ambulatory Visit (HOSPITAL_COMMUNITY): Payer: BLUE CROSS/BLUE SHIELD | Attending: Orthopedic Surgery | Admitting: Physical Therapy

## 2016-07-30 DIAGNOSIS — R600 Localized edema: Secondary | ICD-10-CM

## 2016-07-30 DIAGNOSIS — M6281 Muscle weakness (generalized): Secondary | ICD-10-CM | POA: Diagnosis not present

## 2016-07-30 DIAGNOSIS — M25562 Pain in left knee: Secondary | ICD-10-CM | POA: Diagnosis not present

## 2016-07-30 DIAGNOSIS — G8929 Other chronic pain: Secondary | ICD-10-CM | POA: Diagnosis not present

## 2016-07-30 NOTE — Therapy (Signed)
[place], [place], [address] Phone: [phone]   Fax:  [phone]  Physical Therapy Treatment  Patient Details  Name: Joyce Herrera MRN: [id number] Date of Birth: 05-03-1960 Referring Provider: Dr. Paralee Cancel  Encounter Date: 07/30/2016      PT End of Session - 07/30/16 1731    Visit Number 4   Number of Visits 12   Date for PT Re-Evaluation 08/19/16   Authorization Type BCBS   Authorization - Visit Number 4   Authorization - Number of Visits 10   PT Start Time 1520   PT Stop Time 1600   PT Time Calculation (min) 40 min   Activity Tolerance Patient tolerated treatment well      Past Medical History:  Diagnosis Date  . Headache(784.0)   . History of colon polyps   . Hypothyroidism   . PONV (postoperative nausea and vomiting)   . Seasonal allergies     Past Surgical History:  Procedure Laterality Date  . ABDOMINAL HYSTERECTOMY    . CHOLECYSTECTOMY    . COLONOSCOPY  09/14/2010   Procedure: COLONOSCOPY;  Surgeon: Rogene Houston, MD;  [facility];  Service: Endoscopy;  Laterality: N/A;  . COLONOSCOPY N/A 01/11/2016   Procedure: COLONOSCOPY;  Surgeon: Rogene Houston, MD;  [facility];  Service: Endoscopy;  Laterality: N/A;  930  . MEDIAL PARTIAL KNEE REPLACEMENT    . NASAL SEPTUM SURGERY    . PLANTAR FASCIECTOMY     bilateral  . POLYPECTOMY  01/11/2016   Procedure: POLYPECTOMY;  Surgeon: Rogene Houston, MD;  [facility];  Service: Endoscopy;;  multiple colon polyps   . TONSILLECTOMY      There were no vitals filed for this visit.                       [facility] Adult PT Treatment/Exercise - 07/30/16 0001      Knee/Hip Exercises: Stretches   Active Hamstring Stretch Left;3 reps;30 seconds   Active Hamstring Stretch Limitations 12" box     Knee/Hip Exercises: Standing   Heel Raises Both;15 reps   Knee Flexion Strengthening;Left;10 reps   Knee Flexion Limitations 3   Forward Lunges Both;15 reps   Forward Lunges Limitations 4" box   Rocker Board 2 minutes   Rocker Board Limitations 10 reps A/P and Rt/Lt   SLS x3     Knee/Hip Exercises: Seated   Sit to Sand 10 reps;without UE support     Knee/Hip Exercises: Supine   Straight Leg Raises Left;10 reps     Modalities   Modalities Iontophoresis     Iontophoresis   Type of Iontophoresis Dexamethasone   Location medial knee LT    Dose 76m-A/min   Time 6 hr      Manual Therapy   Manual Therapy Soft tissue mobilization   Manual therapy comments Completed separately from ther ex   Joint Mobilization medial knee   Passive ROM patellar mobs                PT Education - 07/30/16 1706    Education provided Yes   Education Details educated on continuing her HEP and topical lotions she could try for pain relief (biofreeze).   Person(s) Educated Patient   Methods Explanation   Comprehension Verbalized understanding          PT Short Term Goals - 07/25/16 1125  PT SHORT TERM GOAL #1   Title Pt Pain in her left knee to be no greater than a 5/10 to allow pt to sleep for 6 hours.    Time 2   Period Weeks   Status On-going     PT SHORT TERM GOAL #2   Title Pt to be able to walk for 30 minutes in comfort to be able to complete shopping in comfort    Time 2   Period Weeks   Status On-going     PT SHORT TERM GOAL #3   Title Pt to be able to sit for up to 90 minutes for traveling    Time 2   Period Weeks   Status On-going     PT SHORT TERM GOAL #4   Title Pt to be able to stand with equal weight bearing for 20 minutes to  make a meal    Time 2   Period Weeks   Status On-going           PT Long Term Goals - 07/25/16 1125      PT LONG TERM GOAL #1   Title Pt pain in her left knee to be no greater than a 2/10 to allow pt to sleep 7 hours    Time 4   Period Weeks   Status On-going     PT LONG TERM GOAL #2   Title Pt to be able to walk for  up to 45 minutes for shopping/ recreational activity.    Time 4   Period Weeks   Status On-going     PT LONG TERM GOAL #3   Title Pt to be able to go up and down  4 steps in a reciprocal manner in comfort    Time 4   Period Weeks   Status On-going     PT LONG TERM GOAL #4   Title Pt to be able to squat down to the ground to work on gardening tasks/ pick up items off the floor.    Time 4   Period Weeks   Status On-going               Plan - 07/30/16 1732    Clinical Impression Statement PT completed 4th ionto treatment this session.  Able to increase reps of most exercises and begin sit to stand activity to activate glutes and quads.  pt conintues to complete given HEP.     Rehab Potential Good   PT Frequency 3x / week   PT Duration 4 weeks   PT Treatment/Interventions ADLs/Self Care Home Management;Cryotherapy;Gait training;Stair training;Iontophoresis 4mg /ml Dexamethasone;Therapeutic exercise;Therapeutic activities;Functional mobility training;Manual techniques   PT Next Visit Plan Give pt self mobilization for inferior/ superior patellar mobs sheet (per CR, PT.  Unsure of where this is).     Continue with iontophoresis treatment.      PT Home Exercise Plan Hamsting and IT band stretch.       Patient will benefit from skilled therapeutic intervention in order to improve the following deficits and impairments:  Abnormal gait, Decreased activity tolerance, Pain, Difficulty walking, Decreased strength, Increased edema  Visit Diagnosis: Muscle weakness (generalized)  Localized edema  Chronic pain of left knee     Problem List Patient Active Problem List   Diagnosis Date Noted  . Vaginal irritation 07/19/2016  . Urinary frequency 07/19/2016  . Pelvic relaxation due to cystocele, midline 07/19/2016  . Atrophic vaginitis 07/19/2016  . Seasonal allergic rhinitis due to pollen 07/17/2016  .  Moderate persistent asthma, uncomplicated 25/42/7062  . Drug allergy  07/17/2016  . Chronic nonintractable headache 07/17/2016  . Allergic contact dermatitis due to metals 07/17/2016  . Class 1 obesity due to excess calories without serious comorbidity with body mass index (BMI) of 30.0 to 30.9 in adult 03/14/2016  . Family hx of colon cancer 09/16/2015  . History of colonic polyps 09/16/2015  . Subclinical hyperthyroidism 08/24/2015  . Hyperlipidemia 08/24/2015    Teena Irani, PTA/CLT [id number]  07/30/2016, 5:40 PM  [facility] [address] [place], [place], [address] Phone: [phone]   Fax:  [phone]  Name: Joyce Herrera MRN: [id number] Date of Birth: Jul 18, 1960

## 2016-07-31 ENCOUNTER — Ambulatory Visit (HOSPITAL_COMMUNITY): Payer: BLUE CROSS/BLUE SHIELD | Admitting: Physical Therapy

## 2016-07-31 ENCOUNTER — Telehealth: Payer: Self-pay | Admitting: Allergy & Immunology

## 2016-07-31 DIAGNOSIS — M25562 Pain in left knee: Secondary | ICD-10-CM

## 2016-07-31 DIAGNOSIS — M6281 Muscle weakness (generalized): Secondary | ICD-10-CM | POA: Diagnosis not present

## 2016-07-31 DIAGNOSIS — G8929 Other chronic pain: Secondary | ICD-10-CM

## 2016-07-31 DIAGNOSIS — R600 Localized edema: Secondary | ICD-10-CM | POA: Diagnosis not present

## 2016-07-31 NOTE — Telephone Encounter (Signed)
Pharmacy called needing a prior authorization for Oral Air.

## 2016-07-31 NOTE — Therapy (Signed)
[facility] [place], [place], [address] Phone: [phone]   Fax:  [phone]  Physical Therapy Treatment  Patient Details  Name: Joyce Herrera MRN: [id number] Date of Birth: 1960-02-28 Referring Provider: Dr. Paralee Cancel  Encounter Date: 07/31/2016      PT End of Session - 07/31/16 1349    Visit Number 5   Number of Visits 12   Date for PT Re-Evaluation 08/19/16   Authorization Type BCBS   Authorization - Visit Number 5   Authorization - Number of Visits 10   PT Start Time 1300   PT Stop Time 1345   PT Time Calculation (min) 45 min   Activity Tolerance Patient tolerated treatment well      Past Medical History:  Diagnosis Date  . Headache(784.0)   . History of colon polyps   . Hypothyroidism   . PONV (postoperative nausea and vomiting)   . Seasonal allergies     Past Surgical History:  Procedure Laterality Date  . ABDOMINAL HYSTERECTOMY    . CHOLECYSTECTOMY    . COLONOSCOPY  09/14/2010   Procedure: COLONOSCOPY;  Surgeon: Rogene Houston, MD;  [facility];  Service: Endoscopy;  Laterality: N/A;  . COLONOSCOPY N/A 01/11/2016   Procedure: COLONOSCOPY;  Surgeon: Rogene Houston, MD;  [facility];  Service: Endoscopy;  Laterality: N/A;  930  . MEDIAL PARTIAL KNEE REPLACEMENT    . NASAL SEPTUM SURGERY    . PLANTAR FASCIECTOMY     bilateral  . POLYPECTOMY  01/11/2016   Procedure: POLYPECTOMY;  Surgeon: Rogene Houston, MD;  [facility];  Service: Endoscopy;;  multiple colon polyps   . TONSILLECTOMY      There were no vitals filed for this visit.      Subjective Assessment - 07/31/16 1309    Subjective Pt states that she has not done a lot today. She thinks that the ionto is helping her pain    Pertinent History unremarkable    How long can you sit comfortably? no longer than an hour.    How long can you stand comfortably? less than ten minutes   How long can you walk  comfortably? 20 minutes    Patient Stated Goals sit, stand and walk longer, sleep better,( can't get to sleep without taking medication and then wakes up after four hours.)   Currently in Pain? Yes   Pain Score 5    Pain Location Knee   Pain Orientation Left   Pain Descriptors / Indicators Aching   Pain Type Chronic pain   Pain Onset More than a month ago   Pain Frequency Constant   Aggravating Factors  activity    Pain Relieving Factors not sure                          [facility] Adult PT Treatment/Exercise - 07/31/16 0001      Knee/Hip Exercises: Stretches   Active Hamstring Stretch Left;3 reps;30 seconds   Active Hamstring Stretch Limitations 12" box   Gastroc Stretch Limitations slant board x 30":    Other Knee/Hip Stretches ITband stretch 30" x3      Knee/Hip Exercises: Standing   Heel Raises Both;15 reps   Knee Flexion Strengthening;Left;15 reps   Knee Flexion Limitations 3   Forward Lunges Both;15 reps   Forward Lunges Limitations floor    Terminal Knee Extension Limitations 10   Wall  Squat 10 reps   Rocker Board 2 minutes   Rocker Board Limitations 10 reps A/P and Rt/Lt   SLS x3     Knee/Hip Exercises: Seated   Sit to Sand 10 reps;without UE support     Knee/Hip Exercises: Supine   Straight Leg Raises --     Modalities   Modalities Iontophoresis     Iontophoresis   Type of Iontophoresis Dexamethasone   Location medial knee LT    Dose 52m-A/min   Time 6 hr      Manual Therapy   Manual Therapy Soft tissue mobilization   Manual therapy comments Completed separately from ther ex   Joint Mobilization medial knee   Passive ROM patellar mobs                PT Education - 07/30/16 1706    Education provided Yes   Education Details educated on continuing her HEP and topical lotions she could try for pain relief (biofreeze).   Person(s) Educated Patient   Methods Explanation   Comprehension Verbalized understanding          PT  Short Term Goals - 07/25/16 1125      PT SHORT TERM GOAL #1   Title Pt Pain in her left knee to be no greater than a 5/10 to allow pt to sleep for 6 hours.    Time 2   Period Weeks   Status On-going     PT SHORT TERM GOAL #2   Title Pt to be able to walk for 30 minutes in comfort to be able to complete shopping in comfort    Time 2   Period Weeks   Status On-going     PT SHORT TERM GOAL #3   Title Pt to be able to sit for up to 90 minutes for traveling    Time 2   Period Weeks   Status On-going     PT SHORT TERM GOAL #4   Title Pt to be able to stand with equal weight bearing for 20 minutes to  make a meal    Time 2   Period Weeks   Status On-going           PT Long Term Goals - 07/25/16 1125      PT LONG TERM GOAL #1   Title Pt pain in her left knee to be no greater than a 2/10 to allow pt to sleep 7 hours    Time 4   Period Weeks   Status On-going     PT LONG TERM GOAL #2   Title Pt to be able to walk for up to 45 minutes for shopping/ recreational activity.    Time 4   Period Weeks   Status On-going     PT LONG TERM GOAL #3   Title Pt to be able to go up and down  4 steps in a reciprocal manner in comfort    Time 4   Period Weeks   Status On-going     PT LONG TERM GOAL #4   Title Pt to be able to squat down to the ground to work on gardening tasks/ pick up items off the floor.    Time 4   Period Weeks   Status On-going               Plan - 07/31/16 1349    Clinical Impression Statement 5th treatment of ionto given today.  Edema in medial aspect of  knee is decreased but still present.  Added standing IT band stretch. Pt continues to have increased pain with increased activity.    Rehab Potential Good   PT Frequency 3x / week   PT Duration 4 weeks   PT Treatment/Interventions ADLs/Self Care Home Management;Cryotherapy;Gait training;Stair training;Iontophoresis 4mg /ml Dexamethasone;Therapeutic exercise;Therapeutic activities;Functional mobility  training;Manual techniques   PT Next Visit Plan Show self IT band while supine    PT Home Exercise Plan Hamsting and IT band stretch.       Patient will benefit from skilled therapeutic intervention in order to improve the following deficits and impairments:  Abnormal gait, Decreased activity tolerance, Pain, Difficulty walking, Decreased strength, Increased edema  Visit Diagnosis: Muscle weakness (generalized)  Localized edema  Chronic pain of left knee     Problem List Patient Active Problem List   Diagnosis Date Noted  . Vaginal irritation 07/19/2016  . Urinary frequency 07/19/2016  . Pelvic relaxation due to cystocele, midline 07/19/2016  . Atrophic vaginitis 07/19/2016  . Seasonal allergic rhinitis due to pollen 07/17/2016  . Moderate persistent asthma, uncomplicated 94/80/1655  . Drug allergy 07/17/2016  . Chronic nonintractable headache 07/17/2016  . Allergic contact dermatitis due to metals 07/17/2016  . Class 1 obesity due to excess calories without serious comorbidity with body mass index (BMI) of 30.0 to 30.9 in adult 03/14/2016  . Family hx of colon cancer 09/16/2015  . History of colonic polyps 09/16/2015  . Subclinical hyperthyroidism 08/24/2015  . Hyperlipidemia 08/24/2015  07/31/2016, 1:52 PM Rayetta Humphrey, PT CLT [facility] [address] [place], [place], [address] Phone: [phone]   Fax:  [phone]  Name: CONCETTINA LETH MRN: [id number] Date of Birth: Dec 07, 1960

## 2016-07-31 NOTE — Telephone Encounter (Signed)
PA was already re-done on paper and faxed on Friday 07/27/16. Has not been 3 business days yet. Will call on Thursday due to July 4th holiday.

## 2016-07-31 NOTE — Telephone Encounter (Signed)
Ok

## 2016-08-02 ENCOUNTER — Ambulatory Visit (HOSPITAL_COMMUNITY): Payer: BLUE CROSS/BLUE SHIELD

## 2016-08-02 DIAGNOSIS — G8929 Other chronic pain: Secondary | ICD-10-CM

## 2016-08-02 DIAGNOSIS — M6281 Muscle weakness (generalized): Secondary | ICD-10-CM

## 2016-08-02 DIAGNOSIS — M25562 Pain in left knee: Secondary | ICD-10-CM | POA: Diagnosis not present

## 2016-08-02 DIAGNOSIS — R600 Localized edema: Secondary | ICD-10-CM

## 2016-08-02 NOTE — Telephone Encounter (Signed)
Called insurance company-PA was approved. Spoke with [facility] to make aware. Also called pt and left a message to return call regarding approval.

## 2016-08-02 NOTE — Therapy (Signed)
[facility] [place], [place], [address] Phone: [phone]   Fax:  [phone]  Physical Therapy Treatment  Patient Details  Name: Joyce Herrera MRN: [id number] Date of Birth: 06/10/1960 Referring Provider: Dr. Paralee Cancel  Encounter Date: 08/02/2016      PT End of Session - 08/02/16 1436    Visit Number 6   Number of Visits 12   Date for PT Re-Evaluation 08/19/16   Authorization Type BCBS   Authorization - Visit Number 6   Authorization - Number of Visits 10   PT Start Time 9323   PT Stop Time 1521   PT Time Calculation (min) 49 min   Activity Tolerance Patient tolerated treatment well   Behavior During Therapy [facility] for tasks assessed/performed      Past Medical History:  Diagnosis Date  . Headache(784.0)   . History of colon polyps   . Hypothyroidism   . PONV (postoperative nausea and vomiting)   . Seasonal allergies     Past Surgical History:  Procedure Laterality Date  . ABDOMINAL HYSTERECTOMY    . CHOLECYSTECTOMY    . COLONOSCOPY  09/14/2010   Procedure: COLONOSCOPY;  Surgeon: Rogene Houston, MD;  [facility];  Service: Endoscopy;  Laterality: N/A;  . COLONOSCOPY N/A 01/11/2016   Procedure: COLONOSCOPY;  Surgeon: Rogene Houston, MD;  [facility];  Service: Endoscopy;  Laterality: N/A;  930  . MEDIAL PARTIAL KNEE REPLACEMENT    . NASAL SEPTUM SURGERY    . PLANTAR FASCIECTOMY     bilateral  . POLYPECTOMY  01/11/2016   Procedure: POLYPECTOMY;  Surgeon: Rogene Houston, MD;  [facility];  Service: Endoscopy;;  multiple colon polyps   . TONSILLECTOMY      There were no vitals filed for this visit.      Subjective Assessment - 08/02/16 1433    Subjective Pt reports her knee feels good in the morning, feels tighter through out the day.  Reports positive results following manual and ionto.   Patient Stated Goals sit, stand and walk longer, sleep better,( can't get  to sleep without taking medication and then wakes up after four hours.)   Currently in Pain? Yes   Pain Score 6    Pain Location Knee   Pain Orientation Left   Pain Descriptors / Indicators Tightness   Pain Type Chronic pain   Pain Onset More than a month ago   Pain Frequency Constant   Aggravating Factors  activity   Pain Relieving Factors not sure   Effect of Pain on Daily Activities increases                         [facility] Adult PT Treatment/Exercise - 08/02/16 0001      Knee/Hip Exercises: Standing   Heel Raises Both;15 reps   Heel Raises Limitations Toe raises   Knee Flexion Strengthening;Left;15 reps   Knee Flexion Limitations 3   Forward Lunges Both;15 reps   Forward Lunges Limitations floor    Wall Squat 10 reps;3 seconds   Rocker Board 2 minutes   Rocker Board Limitations Rt/Lt and A/P     Knee/Hip Exercises: Seated   Sit to Sand 10 reps;without UE support  cueing for mechanics and eccentric control     Iontophoresis   Type of Iontophoresis Dexamethasone   Location medial knee LT    Dose 68m-A/min   Time 6  hr      Manual Therapy   Manual Therapy Soft tissue mobilization   Manual therapy comments Completed separately from ther ex   Joint Mobilization medial knee   Passive ROM patellar mobs                  PT Short Term Goals - 07/25/16 1125      PT SHORT TERM GOAL #1   Title Pt Pain in her left knee to be no greater than a 5/10 to allow pt to sleep for 6 hours.    Time 2   Period Weeks   Status On-going     PT SHORT TERM GOAL #2   Title Pt to be able to walk for 30 minutes in comfort to be able to complete shopping in comfort    Time 2   Period Weeks   Status On-going     PT SHORT TERM GOAL #3   Title Pt to be able to sit for up to 90 minutes for traveling    Time 2   Period Weeks   Status On-going     PT SHORT TERM GOAL #4   Title Pt to be able to stand with equal weight bearing for 20 minutes to  make a meal     Time 2   Period Weeks   Status On-going           PT Long Term Goals - 07/25/16 1125      PT LONG TERM GOAL #1   Title Pt pain in her left knee to be no greater than a 2/10 to allow pt to sleep 7 hours    Time 4   Period Weeks   Status On-going     PT LONG TERM GOAL #2   Title Pt to be able to walk for up to 45 minutes for shopping/ recreational activity.    Time 4   Period Weeks   Status On-going     PT LONG TERM GOAL #3   Title Pt to be able to go up and down  4 steps in a reciprocal manner in comfort    Time 4   Period Weeks   Status On-going     PT LONG TERM GOAL #4   Title Pt to be able to squat down to the ground to work on gardening tasks/ pick up items off the floor.    Time 4   Period Weeks   Status On-going               Plan - 08/02/16 1904    Clinical Impression Statement Session foucs on functional strengthening and pain control.  Pt continues to have increased pain wiht increase CKC activities, increased to 4/10 today.  Added supine ITB stretch to HEP, pt able to demonstrate and verbalize appropriate form and hold time.  EOS with manual MFR to medial aspect of knee for pain control and 6th treatment of ionto given.  Pt stated she feels best relief following manual with sessions.     Rehab Potential Good   PT Frequency 3x / week   PT Duration 4 weeks   PT Treatment/Interventions ADLs/Self Care Home Management;Cryotherapy;Gait training;Stair training;Iontophoresis 4mg /ml Dexamethasone;Therapeutic exercise;Therapeutic activities;Functional mobility training;Manual techniques   PT Next Visit Plan F/u with ionto tx as this session was 6/6 tx.  Continue manual technqieus for pain control and progress functional strengthening with step training next session.     PT Home Exercise Plan Hamsting and  IT band stretch.       Patient will benefit from skilled therapeutic intervention in order to improve the following deficits and impairments:  Abnormal gait,  Decreased activity tolerance, Pain, Difficulty walking, Decreased strength, Increased edema  Visit Diagnosis: Muscle weakness (generalized)  Localized edema  Chronic pain of left knee     Problem List Patient Active Problem List   Diagnosis Date Noted  . Vaginal irritation 07/19/2016  . Urinary frequency 07/19/2016  . Pelvic relaxation due to cystocele, midline 07/19/2016  . Atrophic vaginitis 07/19/2016  . Seasonal allergic rhinitis due to pollen 07/17/2016  . Moderate persistent asthma, uncomplicated 09/73/5329  . Drug allergy 07/17/2016  . Chronic nonintractable headache 07/17/2016  . Allergic contact dermatitis due to metals 07/17/2016  . Class 1 obesity due to excess calories without serious comorbidity with body mass index (BMI) of 30.0 to 30.9 in adult 03/14/2016  . Family hx of colon cancer 09/16/2015  . History of colonic polyps 09/16/2015  . Subclinical hyperthyroidism 08/24/2015  . Hyperlipidemia 08/24/2015   Ihor Austin, [place]; [place]  Aldona Lento 08/02/2016, 7:11 PM  [facility] [address] [place], [place], [address] Phone: [phone]   Fax:  [phone]  Name: Joyce Herrera MRN: [id number] Date of Birth: January 24, 1961

## 2016-08-02 NOTE — Patient Instructions (Signed)
Outer Hip Stretch: Reclined IT Band Stretch (Strap)    Strap around opposite foot, pull across only as far as possible with shoulders on mat. Hold for 30 breaths. Repeat 3 times each leg.  Copyright  VHI. All rights reserved.   

## 2016-08-07 ENCOUNTER — Telehealth (HOSPITAL_COMMUNITY): Payer: Self-pay

## 2016-08-07 ENCOUNTER — Ambulatory Visit (HOSPITAL_COMMUNITY): Payer: BLUE CROSS/BLUE SHIELD

## 2016-08-07 ENCOUNTER — Telehealth (HOSPITAL_COMMUNITY): Payer: Self-pay | Admitting: Internal Medicine

## 2016-08-07 NOTE — Telephone Encounter (Signed)
08/07/16  Pt called to say that she just wasn't coming in today from where she fell this morning

## 2016-08-07 NOTE — Telephone Encounter (Signed)
Returned call concerning message received about fall earlier today.  Unable to speak to pt., left message  Joyce Herrera, [place]; [facility] [phone]

## 2016-08-08 ENCOUNTER — Telehealth (HOSPITAL_COMMUNITY): Payer: Self-pay | Admitting: Internal Medicine

## 2016-08-08 NOTE — Telephone Encounter (Signed)
08/08/16  pt called and said to just cancel this week because of where she fell she just can't come to therapy

## 2016-08-09 ENCOUNTER — Ambulatory Visit (HOSPITAL_COMMUNITY): Payer: BLUE CROSS/BLUE SHIELD | Admitting: Physical Therapy

## 2016-08-10 ENCOUNTER — Ambulatory Visit (HOSPITAL_COMMUNITY): Payer: BLUE CROSS/BLUE SHIELD

## 2016-08-13 ENCOUNTER — Telehealth (HOSPITAL_COMMUNITY): Payer: Self-pay | Admitting: Internal Medicine

## 2016-08-13 ENCOUNTER — Ambulatory Visit (HOSPITAL_COMMUNITY): Payer: BLUE CROSS/BLUE SHIELD | Admitting: Physical Therapy

## 2016-08-13 NOTE — Telephone Encounter (Signed)
08/13/16  Pt called to cx, she said that she wouldn't be making it to therapy today

## 2016-08-15 ENCOUNTER — Ambulatory Visit (HOSPITAL_COMMUNITY): Payer: BLUE CROSS/BLUE SHIELD | Admitting: Physical Therapy

## 2016-08-15 DIAGNOSIS — M25562 Pain in left knee: Secondary | ICD-10-CM | POA: Diagnosis not present

## 2016-08-15 DIAGNOSIS — R600 Localized edema: Secondary | ICD-10-CM

## 2016-08-15 DIAGNOSIS — M6281 Muscle weakness (generalized): Secondary | ICD-10-CM | POA: Diagnosis not present

## 2016-08-15 DIAGNOSIS — G8929 Other chronic pain: Secondary | ICD-10-CM

## 2016-08-15 NOTE — Therapy (Signed)
[place], [place], [address] Phone: [phone]   Fax:  [phone]  Physical Therapy Treatment  Patient Details  Name: Joyce Herrera MRN: [id number] Date of Birth: [birth year] Referring Provider: Dr. Paralee Cancel  Encounter Date: 08/15/2016      PT End of Session - 08/15/16 1358    Visit Number 7   Number of Visits 12   Date for PT Re-Evaluation 08/19/16   Authorization Type BCBS   Authorization - Visit Number 7   Authorization - Number of Visits 10   Activity Tolerance Patient tolerated treatment well   Behavior During Therapy [facility] for tasks assessed/performed      Past Medical History:  Diagnosis Date  . Headache(784.0)   . History of colon polyps   . Hypothyroidism   . PONV (postoperative nausea and vomiting)   . Seasonal allergies     Past Surgical History:  Procedure Laterality Date  . ABDOMINAL HYSTERECTOMY    . CHOLECYSTECTOMY    . COLONOSCOPY  09/14/2010   Procedure: COLONOSCOPY;  Surgeon: Rogene Houston, MD;  [facility];  Service: Endoscopy;  Laterality: N/A;  . COLONOSCOPY N/A 01/11/2016   Procedure: COLONOSCOPY;  Surgeon: Rogene Houston, MD;  [facility];  Service: Endoscopy;  Laterality: N/A;  930  . MEDIAL PARTIAL KNEE REPLACEMENT    . NASAL SEPTUM SURGERY    . PLANTAR FASCIECTOMY     bilateral  . POLYPECTOMY  01/11/2016   Procedure: POLYPECTOMY;  Surgeon: Rogene Houston, MD;  [facility];  Service: Endoscopy;;  multiple colon polyps   . TONSILLECTOMY      There were no vitals filed for this visit.      Subjective Assessment - 08/15/16 1403    Currently in Pain? Yes   Pain Score 8    Pain Location Knee   Pain Orientation Left   Pain Descriptors / Indicators Aching;Throbbing                         [facility] Adult PT Treatment/Exercise - 08/15/16 0001      Modalities   Modalities Cryotherapy     Cryotherapy   Number  Minutes Cryotherapy 5 Minutes   Cryotherapy Location Knee   Type of Cryotherapy Ice massage     Manual Therapy   Manual Therapy Soft tissue mobilization   Manual therapy comments Completed separately from ther ex   Soft tissue mobilization perimeter of knee, mostly inferior and medial                PT Education - 08/15/16 1403    Education provided Yes          PT Short Term Goals - 07/25/16 1125      PT SHORT TERM GOAL #1   Title Pt Pain in her left knee to be no greater than a 5/10 to allow pt to sleep for 6 hours.    Time 2   Period Weeks   Status On-going     PT SHORT TERM GOAL #2   Title Pt to be able to walk for 30 minutes in comfort to be able to complete shopping in comfort    Time 2   Period Weeks   Status On-going     PT SHORT TERM GOAL #3   Title Pt to be able to sit for up to 90 minutes for traveling  Time 2   Period Weeks   Status On-going     PT SHORT TERM GOAL #4   Title Pt to be able to stand with equal weight bearing for 20 minutes to  make a meal    Time 2   Period Weeks   Status On-going           PT Long Term Goals - 07/25/16 1125      PT LONG TERM GOAL #1   Title Pt pain in her left knee to be no greater than a 2/10 to allow pt to sleep 7 hours    Time 4   Period Weeks   Status On-going     PT LONG TERM GOAL #2   Title Pt to be able to walk for up to 45 minutes for shopping/ recreational activity.    Time 4   Period Weeks   Status On-going     PT LONG TERM GOAL #3   Title Pt to be able to go up and down  4 steps in a reciprocal manner in comfort    Time 4   Period Weeks   Status On-going     PT LONG TERM GOAL #4   Title Pt to be able to squat down to the ground to work on gardening tasks/ pick up items off the floor.    Time 4   Period Weeks   Status On-going               Plan - 08/15/16 1359    Clinical Impression Statement PT requested no exercises today as she is doing these at home.  REquested  only manual to help decrease her pain.  Completed soft tissue work to perimeter of knee to decrease adhesions (found medially) and general tightness.  Followed up with ice massage to tender area that was point of contact when fell.  Instructed pateint to continue to ice at home as well.  Pt reported redution of pain to 5/10, hwoever much improved gait without antalgia noted.    Rehab Potential Good   PT Frequency 3x / week   PT Duration 4 weeks   PT Treatment/Interventions ADLs/Self Care Home Management;Cryotherapy;Gait training;Stair training;Iontophoresis 4mg /ml Dexamethasone;Therapeutic exercise;Therapeutic activities;Functional mobility training;Manual techniques   PT Next Visit Plan Continue manual technqieus for pain control.  Begin step training next session.  Discuss POC with evaluating therapist as far as changing plan, continuing or discharge.    PT Home Exercise Plan [place] and IT band stretch.       Patient will benefit from skilled therapeutic intervention in order to improve the following deficits and impairments:  Abnormal gait, Decreased activity tolerance, Pain, Difficulty walking, Decreased strength, Increased edema  Visit Diagnosis: Muscle weakness (generalized)  Localized edema  Chronic pain of left knee     Problem List Patient Active Problem List   Diagnosis Date Noted  . Vaginal irritation 07/19/2016  . Urinary frequency 07/19/2016  . Pelvic relaxation due to cystocele, midline 07/19/2016  . Atrophic vaginitis 07/19/2016  . Seasonal allergic rhinitis due to pollen 07/17/2016  . Moderate persistent asthma, uncomplicated 99/83/3825  . Drug allergy 07/17/2016  . Chronic nonintractable headache 07/17/2016  . Allergic contact dermatitis due to metals 07/17/2016  . Class 1 obesity due to excess calories without serious comorbidity with body mass index (BMI) of 30.0 to 30.9 in adult 03/14/2016  . Family hx of colon cancer 09/16/2015  . History of colonic polyps  09/16/2015  . Subclinical hyperthyroidism 08/24/2015  .  Hyperlipidemia 08/24/2015   Teena Irani, PTA/CLT [phone] ] Mare Ferrari, Amy B 08/15/2016, 2:05 PM  [facility] [address] [place], [place], [address] Phone: [phone]   Fax:  [phone]  Name: Joyce Herrera MRN: [id number] Date of Birth: 1960/11/19

## 2016-08-16 ENCOUNTER — Ambulatory Visit: Payer: BLUE CROSS/BLUE SHIELD | Admitting: Adult Health

## 2016-08-17 ENCOUNTER — Ambulatory Visit (HOSPITAL_COMMUNITY): Payer: BLUE CROSS/BLUE SHIELD | Admitting: Physical Therapy

## 2016-08-17 DIAGNOSIS — M6281 Muscle weakness (generalized): Secondary | ICD-10-CM | POA: Diagnosis not present

## 2016-08-17 DIAGNOSIS — G8929 Other chronic pain: Secondary | ICD-10-CM | POA: Diagnosis not present

## 2016-08-17 DIAGNOSIS — R600 Localized edema: Secondary | ICD-10-CM

## 2016-08-17 DIAGNOSIS — M25562 Pain in left knee: Secondary | ICD-10-CM | POA: Diagnosis not present

## 2016-08-17 NOTE — Therapy (Signed)
[place], [place], [address] Phone: [phone]   Fax:  [phone]  Physical Therapy Treatment  Patient Details  Name: Joyce Herrera MRN: [id number] Date of Birth: [birth year] Referring Provider: Dr. Paralee Cancel  Encounter Date: 08/17/2016      PT End of Session - 08/17/16 1241    Visit Number 8   Number of Visits 12   Date for PT Re-Evaluation 08/28/16   Authorization Type BCBS   Authorization - Visit Number 8   Authorization - Number of Visits 10   PT Start Time 6314   PT Stop Time 1200   PT Time Calculation (min) 45 min   Activity Tolerance Patient tolerated treatment well;No increased pain   Behavior During Therapy WFL for tasks assessed/performed      Past Medical History:  Diagnosis Date  . Headache(784.0)   . History of colon polyps   . Hypothyroidism   . PONV (postoperative nausea and vomiting)   . Seasonal allergies     Past Surgical History:  Procedure Laterality Date  . ABDOMINAL HYSTERECTOMY    . CHOLECYSTECTOMY    . COLONOSCOPY  09/14/2010   Procedure: COLONOSCOPY;  Surgeon: Rogene Houston, MD;  [facility];  Service: Endoscopy;  Laterality: N/A;  . COLONOSCOPY N/A 01/11/2016   Procedure: COLONOSCOPY;  Surgeon: Rogene Houston, MD;  [facility];  Service: Endoscopy;  Laterality: N/A;  930  . MEDIAL PARTIAL KNEE REPLACEMENT    . NASAL SEPTUM SURGERY    . PLANTAR FASCIECTOMY     bilateral  . POLYPECTOMY  01/11/2016   Procedure: POLYPECTOMY;  Surgeon: Rogene Houston, MD;  [facility];  Service: Endoscopy;;  multiple colon polyps   . TONSILLECTOMY      There were no vitals filed for this visit.      Subjective Assessment - 08/17/16 1236    Subjective Pt reports things are going well. She continues to have anterior knee discomfort. She does not want to the all of the standing on one leg that she has done in the past, becasue she can do that at  home.    Currently in Pain? No/denies  None with sitting at this time                [facility] Adult PT Treatment/Exercise - 08/17/16 0001      Knee/Hip Exercises: Stretches   Quad Stretch Left;2 reps;30 seconds   Quad Stretch Limitations prone with strap for HEP demo      Knee/Hip Exercises: Seated   Other Seated Knee/Hip Exercises Pt completing self massage/rolling to Lt quadriceps for HEP demo      Manual Therapy   Manual Therapy Soft tissue mobilization   Manual therapy comments Completed separately from other treatment    Soft tissue mobilization STM and trigger point release along Lt rectus femoris, IASTM vastus lateralist/rectus femoris and biceps femoris. TrP release and deep tissue massage along Lt glute med and proximal vastus lateralis; cross friction massage Lt patellar tendon                   PT Short Term Goals - 07/25/16 1125      PT SHORT TERM GOAL #1   Title Pt Pain in her left knee to be no greater than a 5/10 to allow pt to sleep for 6 hours.    Time 2   Period Weeks   Status On-going  PT SHORT TERM GOAL #2   Title Pt to be able to walk for 30 minutes in comfort to be able to complete shopping in comfort    Time 2   Period Weeks   Status On-going     PT SHORT TERM GOAL #3   Title Pt to be able to sit for up to 90 minutes for traveling    Time 2   Period Weeks   Status On-going     PT SHORT TERM GOAL #4   Title Pt to be able to stand with equal weight bearing for 20 minutes to  make a meal    Time 2   Period Weeks   Status On-going           PT Long Term Goals - 07/25/16 1125      PT LONG TERM GOAL #1   Title Pt pain in her left knee to be no greater than a 2/10 to allow pt to sleep 7 hours    Time 4   Period Weeks   Status On-going     PT LONG TERM GOAL #2   Title Pt to be able to walk for up to 45 minutes for shopping/ recreational activity.    Time 4   Period Weeks   Status On-going     PT LONG TERM GOAL #3    Title Pt to be able to go up and down  4 steps in a reciprocal manner in comfort    Time 4   Period Weeks   Status On-going     PT LONG TERM GOAL #4   Title Pt to be able to squat down to the ground to work on gardening tasks/ pick up items off the floor.    Time 4   Period Weeks   Status On-going               Plan - 08/17/16 1242    Clinical Impression Statement Continued this session with manual techniques to address pt's Lt knee pain. Noted muscle spasm and taught bands throughout the rectus femoris, so therapist targeted this with both trigger point release and IASTM techniques. Ended session with pt demonstration of rolling for home and a couple of updates made to her HEP. Pt reporting no increase in pain by the end of the session.   Rehab Potential Good   PT Frequency 3x / week   PT Duration 4 weeks   PT Treatment/Interventions ADLs/Self Care Home Management;Cryotherapy;Gait training;Stair training;Iontophoresis 4mg /ml Dexamethasone;Therapeutic exercise;Therapeutic activities;Functional mobility training;Manual techniques   PT Next Visit Plan Continue manual technqieus to address quad spasm and decrease ITB tension; consider reassessment for decrease in PT frequency   PT Home Exercise Plan prone quad stretch, self rolling to quad      Patient will benefit from skilled therapeutic intervention in order to improve the following deficits and impairments:  Abnormal gait, Decreased activity tolerance, Pain, Difficulty walking, Decreased strength, Increased edema  Visit Diagnosis: Muscle weakness (generalized)  Localized edema  Chronic pain of left knee     Problem List Patient Active Problem List   Diagnosis Date Noted  . Vaginal irritation 07/19/2016  . Urinary frequency 07/19/2016  . Pelvic relaxation due to cystocele, midline 07/19/2016  . Atrophic vaginitis 07/19/2016  . Seasonal allergic rhinitis due to pollen 07/17/2016  . Moderate persistent asthma,  uncomplicated 48/18/5631  . Drug allergy 07/17/2016  . Chronic nonintractable headache 07/17/2016  . Allergic contact dermatitis due to metals 07/17/2016  .  Class 1 obesity due to excess calories without serious comorbidity with body mass index (BMI) of 30.0 to 30.9 in adult 03/14/2016  . Family hx of colon cancer 09/16/2015  . History of colonic polyps 09/16/2015  . Subclinical hyperthyroidism 08/24/2015  . Hyperlipidemia 08/24/2015    12:46 PM,08/17/16 Elly Modena PT, DPT [facility] [address] [place], [place], [address] Phone: [phone]   Fax:  [phone]  Name: Joyce Herrera MRN: [id number] Date of Birth: 1961/01/11

## 2016-08-20 ENCOUNTER — Encounter (HOSPITAL_COMMUNITY): Payer: Self-pay

## 2016-08-20 ENCOUNTER — Ambulatory Visit (HOSPITAL_COMMUNITY): Payer: BLUE CROSS/BLUE SHIELD

## 2016-08-20 DIAGNOSIS — M25562 Pain in left knee: Secondary | ICD-10-CM

## 2016-08-20 DIAGNOSIS — G8929 Other chronic pain: Secondary | ICD-10-CM

## 2016-08-20 DIAGNOSIS — R600 Localized edema: Secondary | ICD-10-CM | POA: Diagnosis not present

## 2016-08-20 DIAGNOSIS — M6281 Muscle weakness (generalized): Secondary | ICD-10-CM | POA: Diagnosis not present

## 2016-08-20 NOTE — Therapy (Signed)
[place], [place], [address] Phone: [phone]   Fax:  [phone]  Physical Therapy Treatment  Patient Details  Name: Joyce Herrera MRN: [id number] Date of Birth: [birth year] Referring Provider: Dr. Paralee Cancel  Encounter Date: 08/20/2016      PT End of Session - 08/20/16 1300    Visit Number 9   Number of Visits 12   Date for PT Re-Evaluation 08/28/16   Authorization Type BCBS   Authorization - Visit Number 9   Authorization - Number of Visits 10   PT Start Time 1301   PT Stop Time 3267   PT Time Calculation (min) 14 min   Activity Tolerance Patient tolerated treatment well;No increased pain   Behavior During Therapy WFL for tasks assessed/performed      Past Medical History:  Diagnosis Date  . Headache(784.0)   . History of colon polyps   . Hypothyroidism   . PONV (postoperative nausea and vomiting)   . Seasonal allergies     Past Surgical History:  Procedure Laterality Date  . ABDOMINAL HYSTERECTOMY    . CHOLECYSTECTOMY    . COLONOSCOPY  09/14/2010   Procedure: COLONOSCOPY;  Surgeon: Rogene Houston, MD;  [facility];  Service: Endoscopy;  Laterality: N/A;  . COLONOSCOPY N/A 01/11/2016   Procedure: COLONOSCOPY;  Surgeon: Rogene Houston, MD;  [facility];  Service: Endoscopy;  Laterality: N/A;  930  . MEDIAL PARTIAL KNEE REPLACEMENT    . NASAL SEPTUM SURGERY    . PLANTAR FASCIECTOMY     bilateral  . POLYPECTOMY  01/11/2016   Procedure: POLYPECTOMY;  Surgeon: Rogene Houston, MD;  [facility];  Service: Endoscopy;;  multiple colon polyps   . TONSILLECTOMY      There were no vitals filed for this visit.      Subjective Assessment - 08/20/16 1301    Subjective Pt states that she was able to get up the steps at church on Sunday and it was easiest it had been for her. She is still having a little pain on the medial/anterior portion of her knee.    Currently in Pain? Yes   Pain Score 5    Pain Location Knee   Pain Orientation Left   Pain Descriptors / Indicators Aching;Throbbing   Pain Type Chronic pain   Pain Onset More than a month ago   Pain Frequency Constant   Aggravating Factors  activity   Pain Relieving Factors not sure   Effect of Pain on Daily Activities increases               PT Education - 08/20/16 1329    Education provided Yes   Education Details potentially d/c depending on progress; educated pt that we did not have to continue to check goals and could proceed with manual therapy but she did not wish to be treated    Person(s) Educated Patient   Methods Explanation   Comprehension Verbalized understanding            PT Short Term Goals - 08/20/16 1307      PT SHORT TERM GOAL #1   Title Pt Pain in her left knee to be no greater than a 5/10 to allow pt to sleep for 6 hours.    Time 2   Period Weeks   Status Achieved     PT SHORT TERM GOAL #2   Title Pt to be able  to walk for 30 minutes in comfort to be able to complete shopping in comfort    Baseline 7/23: able to walk 10-15 mins    Time 2   Period Weeks   Status On-going     PT SHORT TERM GOAL #3   Title Pt to be able to sit for up to 90 minutes for traveling    Baseline 7/23: has to shift weight but doesn't think she could sit comfortably for 90 mins   Time 2   Period Weeks   Status On-going     PT SHORT TERM GOAL #4   Title Pt to be able to stand with equal weight bearing for 20 minutes to  make a meal    Baseline 7/23: has to shift weight back and forth and still has pain in L knee   Time 2   Period Weeks   Status On-going           PT Long Term Goals - 07/25/16 1125      PT LONG TERM GOAL #1   Title Pt pain in her left knee to be no greater than a 2/10 to allow pt to sleep 7 hours    Time 4   Period Weeks   Status On-going     PT LONG TERM GOAL #2   Title Pt to be able to walk for up to 45 minutes for shopping/  recreational activity.    Time 4   Period Weeks   Status On-going     PT LONG TERM GOAL #3   Title Pt to be able to go up and down  4 steps in a reciprocal manner in comfort    Time 4   Period Weeks   Status On-going     PT LONG TERM GOAL #4   Title Pt to be able to squat down to the ground to work on gardening tasks/ pick up items off the floor.    Time 4   Period Weeks   Status On-going               Plan - 08/20/16 1543    Clinical Impression Statement Prior to this session, this PT had spoken with the last treating therapist and the evaluating therapist regarding pt's status. Both agreed that the pt was suitable for reassessment this date and potentially discharge. PT attempted to check pt's goals and potentially d/c depending on progress made. PT explained to pt that since she does the exercises at home and only wants manual therapy when she is in therapy, we can teach her how to perform the same techniques at home in order for her to manage this condition independently. PT also explained to pt that if she can do everything at home then there is no need to continue therapy. PT asked pt if she could do everything she needs or wants to do at home and she said no. She said that she cannot squat down to pick items up off of the floor. PT stated that this was one of her goals and we would assess it and make modifications to her squatting in order to allow her to do it with less pain. PT began checking pt's STG and during which, pt became visibly upset and stated "I feel like you are trying to force me out of therapy. I feel like you're wasting my time. Am I even going to get therapy today?" PT apologized for making pt feel this way and explained  to her that I was going to perform the manual techniques on her once we had finished checking all of the goals. This PT also explained to her that I was not trying to force her out of therapy and that we did not have to discharge this date. Pt  again stated how upset she was and said that she wanted to leave because her time was wasted. PT said that we would not check the remaining goals and could perform the manual therapy for the remainder of the session. Pt stated "I don't want you touching me" and ended the session by exiting the gym.   Rehab Potential Good   PT Frequency 3x / week   PT Duration 4 weeks   PT Treatment/Interventions ADLs/Self Care Home Management;Cryotherapy;Gait training;Stair training;Iontophoresis 4mg /ml Dexamethasone;Therapeutic exercise;Therapeutic activities;Functional mobility training;Manual techniques   PT Next Visit Plan Continue manual technqieus to address quad spasm and decrease ITB tension; consider reassessment for decrease in PT frequency   PT Home Exercise Plan prone quad stretch, self rolling to quad   Consulted and Agree with Plan of Care Patient      Patient will benefit from skilled therapeutic intervention in order to improve the following deficits and impairments:  Abnormal gait, Decreased activity tolerance, Pain, Difficulty walking, Decreased strength, Increased edema  Visit Diagnosis: Muscle weakness (generalized)  Localized edema  Chronic pain of left knee     Problem List Patient Active Problem List   Diagnosis Date Noted  . Vaginal irritation 07/19/2016  . Urinary frequency 07/19/2016  . Pelvic relaxation due to cystocele, midline 07/19/2016  . Atrophic vaginitis 07/19/2016  . Seasonal allergic rhinitis due to pollen 07/17/2016  . Moderate persistent asthma, uncomplicated 33/35/4562  . Drug allergy 07/17/2016  . Chronic nonintractable headache 07/17/2016  . Allergic contact dermatitis due to metals 07/17/2016  . Class 1 obesity due to excess calories without serious comorbidity with body mass index (BMI) of 30.0 to 30.9 in adult 03/14/2016  . Family hx of colon cancer 09/16/2015  . History of colonic polyps 09/16/2015  . Subclinical hyperthyroidism 08/24/2015  .  Hyperlipidemia 08/24/2015      Geraldine Solar PT, DPT  [facility] [address] [place] [place], [place], [address] Phone: [phone]   Fax:  [phone]  Name: ETSUKO DIEROLF MRN: [id number] Date of Birth: 02-Nov-1960

## 2016-08-21 ENCOUNTER — Encounter (HOSPITAL_COMMUNITY): Payer: BLUE CROSS/BLUE SHIELD | Admitting: Physical Therapy

## 2016-08-22 ENCOUNTER — Ambulatory Visit (HOSPITAL_COMMUNITY): Payer: BLUE CROSS/BLUE SHIELD

## 2016-08-22 ENCOUNTER — Ambulatory Visit (HOSPITAL_COMMUNITY): Payer: BLUE CROSS/BLUE SHIELD | Admitting: Physical Therapy

## 2016-08-22 DIAGNOSIS — M25562 Pain in left knee: Secondary | ICD-10-CM | POA: Diagnosis not present

## 2016-08-22 DIAGNOSIS — G8929 Other chronic pain: Secondary | ICD-10-CM | POA: Diagnosis not present

## 2016-08-22 DIAGNOSIS — M6281 Muscle weakness (generalized): Secondary | ICD-10-CM | POA: Diagnosis not present

## 2016-08-22 DIAGNOSIS — R600 Localized edema: Secondary | ICD-10-CM | POA: Diagnosis not present

## 2016-08-22 NOTE — Therapy (Addendum)
[place], [place], [address] Phone: [phone]   Fax:  [phone]  Physical Therapy Treatment  Patient Details  Name: Joyce Herrera MRN: [id number] Date of Birth: [birth year] Referring Provider: Dr. Norva Pavlov   Encounter Date: 08/22/2016      PT End of Session - 08/22/16 1258    Visit Number 10   Number of Visits 12   Date for PT Re-Evaluation 08/28/16   Authorization Type [employer]   Authorization - Visit Number 10   Authorization - Number of Visits 10   PT Start Time 1300   PT Stop Time 5009   PT Time Calculation (min) 45 min   Activity Tolerance Patient tolerated treatment well;No increased pain   Behavior During Therapy WFL for tasks assessed/performed      Past Medical History:  Diagnosis Date  . Headache(784.0)   . History of colon polyps   . Hypothyroidism   . PONV (postoperative nausea and vomiting)   . Seasonal allergies     Past Surgical History:  Procedure Laterality Date  . ABDOMINAL HYSTERECTOMY    . CHOLECYSTECTOMY    . COLONOSCOPY  09/14/2010   Procedure: COLONOSCOPY;  Surgeon: Rogene Houston, MD;  [facility];  Service: Endoscopy;  Laterality: N/A;  . COLONOSCOPY N/A 01/11/2016   Procedure: COLONOSCOPY;  Surgeon: Rogene Houston, MD;  [facility];  Service: Endoscopy;  Laterality: N/A;  930  . MEDIAL PARTIAL KNEE REPLACEMENT    . NASAL SEPTUM SURGERY    . PLANTAR FASCIECTOMY     bilateral  . POLYPECTOMY  01/11/2016   Procedure: POLYPECTOMY;  Surgeon: Rogene Houston, MD;  [facility];  Service: Endoscopy;;  multiple colon polyps   . TONSILLECTOMY      There were no vitals filed for this visit.      Subjective Assessment - 08/22/16 1445    Subjective PT states that she is hurting a little bit.  She has more pain the more she does. States that she is able to squat down it just feels funny    Pertinent History unremarkable    How long can  you sit comfortably? no longer than an hour. Currently no longer than an hour.     How long can you stand comfortably? less than ten minutes; continues to be less than ten minutes.    How long can you walk comfortably? 10 to 15 minutes was 20 minutes    Patient Stated Goals sit, stand and walk longer, sleep better,( can't get to sleep without taking medication and then wakes up after four hours.)   Pain Onset More than a month ago            [facility] PT Assessment - 08/22/16 0001      Assessment   Medical Diagnosis Lt partial reconstruction    Referring Provider Dr. Norva Pavlov    Onset Date/Surgical Date 09/28/15   Next MD Visit not scheduled    Prior Therapy After surgery      Precautions   Precautions None     Restrictions   Weight Bearing Restrictions No     Balance Screen   Has the patient fallen in the past 6 months No   Has the patient had a decrease in activity level because of a fear of falling?  Yes   Is the patient reluctant to leave their home because of a fear of falling?  No  Prior Function   Level of Independence Independent   Vocation Unemployed   Leisure walking, yard work      Observation/Other Assessments   Focus on Therapeutic Outcomes (FOTO)  53     Functional Tests   Functional tests Single leg stance;Sit to Stand     Single Leg Stance   Comments Lt 60 was 50 ; RT 60 was 36     Sit to Stand   Comments initial5x 10.16; now 10.48      AROM   Left Knee Extension 0   Left Knee Flexion 135     Strength   Right Hip Flexion 5/5   Right Hip Extension 5/5   Right Hip ABduction 5/5   Left Hip Flexion 5/5   Left Hip ABduction 5/5   Right Knee Extension 5/5   Left Knee Flexion 5/5  was 4/5    Left Knee Extension 5/5   Right Ankle Dorsiflexion 5/5   Left Ankle Dorsiflexion 5/5     Flexibility   Soft Tissue Assessment /Muscle Length yes   Hamstrings Lt 163 was 160 ; RT 165                     [facility] Adult PT Treatment/Exercise  - 08/22/16 0001      Knee/Hip Exercises: Aerobic   Nustep Level 3; hills 3      Modalities   Modalities Cryotherapy;Ultrasound     Cryotherapy   Number Minutes Cryotherapy 5 Minutes   Cryotherapy Location Knee   Type of Cryotherapy Ice massage     Ultrasound   Ultrasound Location --  Laser medial knee 4 treatments   Ultrasound Parameters 75 J/cm 2   Ultrasound Goals Pain     Manual Therapy   Manual Therapy Soft tissue mobilization   Manual therapy comments Completed separately from other treatment    Soft tissue mobilization STM and trigger point release along Lt rectus femoris, IASTM vastus lateralist/rectus femoris and biceps femoris. TrP release and deep tissue massage along Lt glute med and proximal vastus lateralis; cross friction massage Lt patellar tendon                 PT Education - 08/22/16 1445    Education provided Yes   Education Details Pt educated that she has the ROM and strength to complete all activity that she wants to and that therapist would advise to push through the activites that she wants to return to.  Pt asked to walk 15 minutes without stopping everyday.  As well as begin ice massaging twice a day.    Person(s) Educated Patient   Methods Explanation   Comprehension Verbalized understanding          PT Short Term Goals - 08/22/16 1319      PT SHORT TERM GOAL #1   Title Pt Pain in her left knee to be no greater than a 5/10 to allow pt to sleep for 6 hours.    Time 2   Period Weeks   Status Achieved     PT SHORT TERM GOAL #2   Title Pt to be able to walk for 30 minutes in comfort to be able to complete shopping in comfort    Baseline 7/23: able to walk 10-15 mins    Time 2   Period Weeks   Status On-going     PT SHORT TERM GOAL #3   Title Pt to be able to sit for up to 90  minutes for traveling    Baseline 7/23: has to shift weight but doesn't think she could sit comfortably for 90 mins   Time 2   Period Weeks   Status On-going      PT SHORT TERM GOAL #4   Title Pt to be able to stand with equal weight bearing for 20 minutes to  make a meal    Baseline 7/23: has to shift weight back and forth and still has pain in L knee   Time 2   Period Weeks   Status On-going           PT Long Term Goals - 08/22/16 1320      PT LONG TERM GOAL #1   Title Pt pain in her left knee to be no greater than a 2/10 to allow pt to sleep 7 hours    Time 4   Period Weeks   Status On-going     PT LONG TERM GOAL #2   Title Pt to be able to walk for up to 45 minutes for shopping/ recreational activity.    Time 4   Period Weeks   Status On-going     PT LONG TERM GOAL #3   Title Pt to be able to go up and down  4 steps in a reciprocal manner in comfort    Time 4   Period Weeks   Status On-going     PT LONG TERM GOAL #4   Title Pt to be able to squat down to the ground to work on gardening tasks/ pick up items off the floor.    Time 4   Period Weeks   Status On-going               Plan - 08/22/16 1448    Clinical Impression Statement Pt reassessed;  Pt balance, ROM and strength are all normal.  Therapist explained that the pt may have to push through the pain until higher activities are easier for her.  Therapist explained that we will see her two more visits for manual, laser and functional activity.    Rehab Potential Good   PT Frequency 3x / week   PT Duration 4 weeks   PT Treatment/Interventions ADLs/Self Care Home Management;Cryotherapy;Gait training;Stair training;Iontophoresis 4mg /ml Dexamethasone;Therapeutic exercise;Therapeutic activities;Functional mobility training;Manual techniques   PT Next Visit Plan Continue manual techniques, Laser for pain, funtional ie tall kneel to 2" foam, steps, wall slide.    PT Home Exercise Plan prone quad stretch, self rolling to quad  Discharge  And complete foto in 2 visits    Consulted and Agree with Plan of Care Patient      Patient will benefit from skilled  therapeutic intervention in order to improve the following deficits and impairments:  Abnormal gait, Decreased activity tolerance, Pain, Difficulty walking, Decreased strength, Increased edema  Visit Diagnosis: Muscle weakness (generalized)  Localized edema  Chronic pain of left knee     Problem List Patient Active Problem List   Diagnosis Date Noted  . Vaginal irritation 07/19/2016  . Urinary frequency 07/19/2016  . Pelvic relaxation due to cystocele, midline 07/19/2016  . Atrophic vaginitis 07/19/2016  . Seasonal allergic rhinitis due to pollen 07/17/2016  . Moderate persistent asthma, uncomplicated 37/10/6267  . Drug allergy 07/17/2016  . Chronic nonintractable headache 07/17/2016  . Allergic contact dermatitis due to metals 07/17/2016  . Class 1 obesity due to excess calories without serious comorbidity with body mass index (BMI) of 30.0 to 30.9 in adult  03/14/2016  . Family hx of colon cancer 09/16/2015  . History of colonic polyps 09/16/2015  . Subclinical hyperthyroidism 08/24/2015  . Hyperlipidemia 08/24/2015    Rayetta Humphrey, PT CLT [phone] 08/22/2016, 2:52 PM  [facility] [address] [place], [place], [address] Phone: [phone]   Fax:  [phone]  Name: Joyce Herrera MRN: [id number] Date of Birth: 06-19-1960  *Addendum to update certification  38:46 PM,08/28/16 Elly Modena PT, [facility] [phone]

## 2016-08-23 ENCOUNTER — Ambulatory Visit (HOSPITAL_COMMUNITY): Payer: BLUE CROSS/BLUE SHIELD

## 2016-08-28 ENCOUNTER — Ambulatory Visit (HOSPITAL_COMMUNITY): Payer: BLUE CROSS/BLUE SHIELD

## 2016-08-28 DIAGNOSIS — M25562 Pain in left knee: Secondary | ICD-10-CM | POA: Diagnosis not present

## 2016-08-28 DIAGNOSIS — R600 Localized edema: Secondary | ICD-10-CM | POA: Diagnosis not present

## 2016-08-28 DIAGNOSIS — M6281 Muscle weakness (generalized): Secondary | ICD-10-CM | POA: Diagnosis not present

## 2016-08-28 DIAGNOSIS — G8929 Other chronic pain: Secondary | ICD-10-CM

## 2016-08-28 NOTE — Addendum Note (Signed)
Addended by: Elly Modena E on: 08/28/2016 12:28 PM   Modules accepted: Orders

## 2016-08-28 NOTE — Therapy (Signed)
[place], [place], [address] Phone: [phone]   Fax:  [phone]  Physical Therapy Treatment  Patient Details  Name: Joyce Herrera MRN: [id number] Date of Birth: May 11, 1960 Referring Provider: Dr. Norva Pavlov   Encounter Date: 08/28/2016      PT End of Session - 08/28/16 1217    Visit Number 11   Number of Visits 12   Date for PT Re-Evaluation 08/28/16   Authorization Type BCBS   Authorization - Visit Number 11   Authorization - Number of Visits 12   PT Start Time 0737   PT Stop Time 1062   PT Time Calculation (min) 48 min   Activity Tolerance Patient tolerated treatment well;No increased pain   Behavior During Therapy WFL for tasks assessed/performed      Past Medical History:  Diagnosis Date  . Headache(784.0)   . History of colon polyps   . Hypothyroidism   . PONV (postoperative nausea and vomiting)   . Seasonal allergies     Past Surgical History:  Procedure Laterality Date  . ABDOMINAL HYSTERECTOMY    . CHOLECYSTECTOMY    . COLONOSCOPY  09/14/2010   Procedure: COLONOSCOPY;  Surgeon: Rogene Houston, MD;  [facility];  Service: Endoscopy;  Laterality: N/A;  . COLONOSCOPY N/A 01/11/2016   Procedure: COLONOSCOPY;  Surgeon: Rogene Houston, MD;  [facility];  Service: Endoscopy;  Laterality: N/A;  930  . MEDIAL PARTIAL KNEE REPLACEMENT    . NASAL SEPTUM SURGERY    . PLANTAR FASCIECTOMY     bilateral  . POLYPECTOMY  01/11/2016   Procedure: POLYPECTOMY;  Surgeon: Rogene Houston, MD;  [facility];  Service: Endoscopy;;  multiple colon polyps   . TONSILLECTOMY      There were no vitals filed for this visit.      Subjective Assessment - 08/28/16 1122    Subjective Pt stated she felt a lot of benefits following the laser last session.  Reports increased ease with stairs following manual and laser   Pertinent History unremarkable    Patient Stated Goals  sit, stand and walk longer, sleep better,( can't get to sleep without taking medication and then wakes up after four hours.)   Currently in Pain? No/denies                         [facility] Adult PT Treatment/Exercise - 08/28/16 0001      Knee/Hip Exercises: Standing   Wall Squat 10 reps;3 seconds   Stairs 5RT 7in step height   Other Standing Knee Exercises Tall kneeling on foam 5x     Ultrasound   Ultrasound Location --  Laser medial knee 6 treatments   Ultrasound Parameters 75 J/cm 2   Ultrasound Goals Pain     Manual Therapy   Manual Therapy Soft tissue mobilization   Manual therapy comments Completed separately from other treatment    Soft tissue mobilization STM and trigger point release along Lt rectus femoris, IASTM vastus lateralist/rectus femoris and biceps femoris. TrP release and deep tissue massage along Lt glute med and proximal vastus lateralis; cross friction massage Lt patellar tendon                   PT Short Term Goals - 08/22/16 1319      PT SHORT TERM GOAL #1   Title Pt Pain in her left knee to be  no greater than a 5/10 to allow pt to sleep for 6 hours.    Time 2   Period Weeks   Status Achieved     PT SHORT TERM GOAL #2   Title Pt to be able to walk for 30 minutes in comfort to be able to complete shopping in comfort    Baseline 7/23: able to walk 10-15 mins    Time 2   Period Weeks   Status On-going     PT SHORT TERM GOAL #3   Title Pt to be able to sit for up to 90 minutes for traveling    Baseline 7/23: has to shift weight but doesn't think she could sit comfortably for 90 mins   Time 2   Period Weeks   Status On-going     PT SHORT TERM GOAL #4   Title Pt to be able to stand with equal weight bearing for 20 minutes to  make a meal    Baseline 7/23: has to shift weight back and forth and still has pain in L knee   Time 2   Period Weeks   Status On-going           PT Long Term Goals - 08/22/16 1320      PT LONG  TERM GOAL #1   Title Pt pain in her left knee to be no greater than a 2/10 to allow pt to sleep 7 hours    Time 4   Period Weeks   Status On-going     PT LONG TERM GOAL #2   Title Pt to be able to walk for up to 45 minutes for shopping/ recreational activity.    Time 4   Period Weeks   Status On-going     PT LONG TERM GOAL #3   Title Pt to be able to go up and down  4 steps in a reciprocal manner in comfort    Time 4   Period Weeks   Status On-going     PT LONG TERM GOAL #4   Title Pt to be able to squat down to the ground to work on gardening tasks/ pick up items off the floor.    Time 4   Period Weeks   Status On-going               Plan - 08/28/16 1247    Clinical Impression Statement Progressed to stair training, wall squats and tall kneeling activities, pt able to demonstrate appropriate mechanics with minimal difficulty.  reports of increased pain following kneelikng activity.  Continued with laser to medial aspect of knee and manual technqiues to address musculature restrictions.     Rehab Potential Good   PT Frequency 3x / week   PT Duration 4 weeks   PT Treatment/Interventions ADLs/Self Care Home Management;Cryotherapy;Gait training;Stair training;Iontophoresis 4mg /ml Dexamethasone;Therapeutic exercise;Therapeutic activities;Functional mobility training;Manual techniques   PT Next Visit Plan Continue manual techniques, Laser for pain, funtional ie tall kneel to 2" foam, steps, wall slide.   FOTO and DC next session per PT POC   PT Home Exercise Plan prone quad stretch, self rolling to quad      Patient will benefit from skilled therapeutic intervention in order to improve the following deficits and impairments:  Abnormal gait, Decreased activity tolerance, Pain, Difficulty walking, Decreased strength, Increased edema  Visit Diagnosis: Muscle weakness (generalized)  Localized edema  Chronic pain of left knee     Problem List Patient Active Problem List  Diagnosis Date Noted  . Vaginal irritation 07/19/2016  . Urinary frequency 07/19/2016  . Pelvic relaxation due to cystocele, midline 07/19/2016  . Atrophic vaginitis 07/19/2016  . Seasonal allergic rhinitis due to pollen 07/17/2016  . Moderate persistent asthma, uncomplicated 50/27/7412  . Drug allergy 07/17/2016  . Chronic nonintractable headache 07/17/2016  . Allergic contact dermatitis due to metals 07/17/2016  . Class 1 obesity due to excess calories without serious comorbidity with body mass index (BMI) of 30.0 to 30.9 in adult 03/14/2016  . Family hx of colon cancer 09/16/2015  . History of colonic polyps 09/16/2015  . Subclinical hyperthyroidism 08/24/2015  . Hyperlipidemia 08/24/2015   Ihor Austin, [place]; [place]  Aldona Lento 08/28/2016, 12:55 PM  [facility] [address] [place], [place], [address] Phone: [phone]   Fax:  [phone]  Name: Joyce Herrera MRN: [id number] Date of Birth: [birth year]

## 2016-08-30 ENCOUNTER — Ambulatory Visit (HOSPITAL_COMMUNITY): Payer: BLUE CROSS/BLUE SHIELD | Attending: Orthopedic Surgery | Admitting: Physical Therapy

## 2016-08-30 DIAGNOSIS — M6281 Muscle weakness (generalized): Secondary | ICD-10-CM

## 2016-08-30 DIAGNOSIS — R600 Localized edema: Secondary | ICD-10-CM | POA: Diagnosis not present

## 2016-08-30 DIAGNOSIS — G8929 Other chronic pain: Secondary | ICD-10-CM

## 2016-08-30 DIAGNOSIS — M25562 Pain in left knee: Secondary | ICD-10-CM | POA: Insufficient documentation

## 2016-08-30 NOTE — Therapy (Signed)
[place], [place], [address] Phone: [phone]   Fax:  [phone]  Physical Therapy Treatment/Discharge  Patient Details  Name: Joyce Herrera MRN: [id number] Date of Birth: 03/24/1960 Referring Provider: Paralee Cancel, MD  Encounter Date: 08/30/2016      PT End of Session - 08/30/16 1218    Visit Number 12   Number of Visits 12   Date for PT Re-Evaluation 08/28/16   Authorization Type [employer]   Authorization - Visit Number 12   Authorization - Number of Visits 12   PT Start Time 2992   PT Stop Time 1202   PT Time Calculation (min) 48 min   Activity Tolerance Patient tolerated treatment well;No increased pain   Behavior During Therapy WFL for tasks assessed/performed      Past Medical History:  Diagnosis Date  . Headache(784.0)   . History of colon polyps   . Hypothyroidism   . PONV (postoperative nausea and vomiting)   . Seasonal allergies     Past Surgical History:  Procedure Laterality Date  . ABDOMINAL HYSTERECTOMY    . CHOLECYSTECTOMY    . COLONOSCOPY  09/14/2010   Procedure: COLONOSCOPY;  Surgeon: Rogene Houston, MD;  [facility];  Service: Endoscopy;  Laterality: N/A;  . COLONOSCOPY N/A 01/11/2016   Procedure: COLONOSCOPY;  Surgeon: Rogene Houston, MD;  [facility];  Service: Endoscopy;  Laterality: N/A;  930  . MEDIAL PARTIAL KNEE REPLACEMENT    . NASAL SEPTUM SURGERY    . PLANTAR FASCIECTOMY     bilateral  . POLYPECTOMY  01/11/2016   Procedure: POLYPECTOMY;  Surgeon: Rogene Houston, MD;  [facility]: [facility];  Service: Endoscopy;;  multiple colon polyps   . TONSILLECTOMY      There were no vitals filed for this visit.      Subjective Assessment - 08/30/16 1119    Subjective Pt reports that she was up at the mall this morning before her session, and she is up to 15 minutes currently. She continues to work on her exercises at home without difficulty. She is  only able to do her rolling 1 time every couple of days because it makes it sore. Pt feels that her inflammation is improved and following the rolling a couple of sessions ago, she feels that she can go up the stairs better at church. She still can't squat because it hurts, she is unable to get up from the floor unless she has something to pull up onto.    Pertinent History unremarkable    How long can you sit comfortably? sitting at an angle in church with some of the weight on her leg.    How long can you stand comfortably? able to stand with weight shifting Lt and Rt   How long can you walk comfortably? up to 15 minutes at this time    Patient Stated Goals sit, stand and walk longer, sleep better,( can't get to sleep without taking medication and then wakes up after four hours.)   Currently in Pain? No/denies  only if standing or being up on her feet            [facility] PT Assessment - 08/30/16 0001      Assessment   Medical Diagnosis Lt partial reconstruction    Referring Provider Paralee Cancel, MD   Onset Date/Surgical Date 09/28/15   Next MD Visit not scheduled    Prior Therapy  After surgery      Precautions   Precautions None     Restrictions   Weight Bearing Restrictions No     Balance Screen   Has the patient fallen in the past 6 months Yes   How many times? 1  stepping onto the sidewalk   Has the patient had a decrease in activity level because of a fear of falling?  Yes   Is the patient reluctant to leave their home because of a fear of falling?  No     Prior Function   Level of Independence Independent   Vocation Unemployed   Leisure walking, yard work      Observation/Other Assessments   Focus on Therapeutic Outcomes (FOTO)  36% limited      Functional Tests   Functional tests Single leg stance;Sit to Stand     Single Leg Stance   Comments Lt 60 was 50 ; RT 60 was 36  per last assessment      Sit to Stand   Comments initial5x 10.16; now 10.48   per last  assessment      AROM   Left Knee Extension 0  per last assessment    Left Knee Flexion 135     Strength   Right Hip Flexion 5/5   Right Hip Extension 5/5   Right Hip ABduction 5/5   Left Hip Flexion 5/5   Left Hip ABduction 5/5   Right Knee Extension 5/5   Left Knee Flexion 5/5  was 4/5    Left Knee Extension 5/5   Right Ankle Dorsiflexion 5/5   Left Ankle Dorsiflexion 5/5     Flexibility   Soft Tissue Assessment /Muscle Length yes   Hamstrings Lt 163 was 160 ; RT 165  per last assessment                      [facility] Adult PT Treatment/Exercise - 08/30/16 0001      Manual Therapy   Manual therapy comments Completed separately from other treatment    Soft tissue mobilization STM and AISTM along rectus femoris and vastus medialis, TrP release rectus femoris                 PT Education - 08/30/16 1212    Education provided Yes   Education Details educated pt on the importance of completing rolling/HEP daily despite soreness and adjusting technique as needed; discussed importance of adjusting activities during the day to improve independence and tolerance (squatting/half kneel); provided information for local massage therapists and [employer] memberships and reviewed benefits of completing activity in the pool. Reviewed rolling technique for improved pt understanding.    Person(s) Educated Patient   Methods Explanation;Handout;Demonstration   Comprehension Verbalized understanding;Returned demonstration          PT Short Term Goals - 08/30/16 1219      PT SHORT TERM GOAL #1   Title Pt Pain in her left knee to be no greater than a 5/10 to allow pt to sleep for 6 hours.    Time 2   Period Weeks   Status Achieved     PT SHORT TERM GOAL #2   Title Pt to be able to walk for 30 minutes in comfort to be able to complete shopping in comfort    Baseline 7/23: able to walk 10-15 mins    Time 2   Period Weeks   Status Partially Met     PT SHORT TERM GOAL #  3    Title Pt to be able to sit for up to 90 minutes for traveling    Baseline 7/23: has to shift weight but doesn't think she could sit comfortably for 90 mins   Time 2   Period Weeks   Status Unable to assess     PT SHORT TERM GOAL #4   Title Pt to be able to stand with equal weight bearing for 20 minutes to  make a meal    Baseline if shifting weight Lt and Rt   Time 2   Period Weeks   Status Achieved           PT Long Term Goals - 08/30/16 1219      PT LONG TERM GOAL #1   Title Pt pain in her left knee to be no greater than a 2/10 to allow pt to sleep 7 hours    Time 4   Period Weeks   Status Partially Met     PT LONG TERM GOAL #2   Title Pt to be able to walk for up to 45 minutes for shopping/ recreational activity.    Time 4   Period Weeks   Status Unable to assess     PT LONG TERM GOAL #3   Title Pt to be able to go up and down  4 steps in a reciprocal manner in comfort    Baseline per pt report stairs are much easier at church   Time 4   Period Weeks   Status Achieved     PT LONG TERM GOAL #4   Title Pt to be able to squat down to the ground to work on gardening tasks/ pick up items off the floor.    Baseline able to complete with pain   Time 4   Period Weeks   Status Achieved               Plan - 08/30/16 1219    Clinical Impression Statement Pt was discharged this visit with a majority of her goals met, reporting improvements stair negotiation, walking tolerance and improved inflammation. At this time, pt has been receiving primarily manual and modality treatments for her remaining symptoms due to excellent balance, strength and knee ROM. Therapist has educated pt on ways to complete self-massage and myofascial mobilization at home. Pt continues to report limitations with walking tolerance and squatting when out in her garden, however despite therapist efforts to review and educate pt on adjustments, the pt was not willing to hear this. Therapist  provided pt with massage therapy contacts to allow her to continue with manual massage treatments as needed and was encouraged to begin water aerobics/walking activity at the local gym.    Rehab Potential Good   PT Frequency 3x / week   PT Duration 4 weeks   PT Treatment/Interventions ADLs/Self Care Home Management;Cryotherapy;Gait training;Stair training;Iontophoresis 54m/ml Dexamethasone;Therapeutic exercise;Therapeutic activities;Functional mobility training;Manual techniques   PT Next Visit Plan Continue manual techniques, Laser for pain, funtional ie tall kneel to 2" foam, steps, wall slide.   FOTO and DC next session per PT POC   PT Home Exercise Plan prone quad stretch, self rolling to quad      Patient will benefit from skilled therapeutic intervention in order to improve the following deficits and impairments:  Abnormal gait, Decreased activity tolerance, Pain, Difficulty walking, Decreased strength, Increased edema  Visit Diagnosis: Muscle weakness (generalized)  Chronic pain of left knee  Localized edema  Problem List Patient Active Problem List   Diagnosis Date Noted  . Vaginal irritation 07/19/2016  . Urinary frequency 07/19/2016  . Pelvic relaxation due to cystocele, midline 07/19/2016  . Atrophic vaginitis 07/19/2016  . Seasonal allergic rhinitis due to pollen 07/17/2016  . Moderate persistent asthma, uncomplicated 00/05/565  . Drug allergy 07/17/2016  . Chronic nonintractable headache 07/17/2016  . Allergic contact dermatitis due to metals 07/17/2016  . Class 1 obesity due to excess calories without serious comorbidity with body mass index (BMI) of 30.0 to 30.9 in adult 03/14/2016  . Family hx of colon cancer 09/16/2015  . History of colonic polyps 09/16/2015  . Subclinical hyperthyroidism 08/24/2015  . Hyperlipidemia 08/24/2015    Elly Modena 08/30/2016, 12:31 PM  [place] [address] [place], [place],  [address] Phone: [phone]   Fax:  [phone]  Name: Joyce Herrera MRN: [id number] Date of Birth: Aug 12, 1960    PHYSICAL THERAPY DISCHARGE SUMMARY  Visits from Start of Care: 12  Current functional level related to goals / functional outcomes: See above for more details    Remaining deficits: See above for more details    Education / Equipment: See above for more details  Plan: Patient agrees to discharge.  Patient goals were partially met. Patient is being discharged due to meeting the stated rehab goals.  ?????    12:33 PM,08/30/16 [place], [facility] [phone]

## 2016-08-31 ENCOUNTER — Encounter (HOSPITAL_COMMUNITY): Payer: BLUE CROSS/BLUE SHIELD | Admitting: Physical Therapy

## 2016-09-04 ENCOUNTER — Ambulatory Visit (HOSPITAL_COMMUNITY): Payer: BLUE CROSS/BLUE SHIELD | Admitting: Physical Therapy

## 2016-09-06 ENCOUNTER — Encounter (HOSPITAL_COMMUNITY): Payer: BLUE CROSS/BLUE SHIELD

## 2016-09-11 ENCOUNTER — Other Ambulatory Visit: Payer: Self-pay | Admitting: Allergy & Immunology

## 2016-09-11 ENCOUNTER — Other Ambulatory Visit: Payer: Self-pay | Admitting: "Endocrinology

## 2016-09-13 DIAGNOSIS — M2550 Pain in unspecified joint: Secondary | ICD-10-CM | POA: Diagnosis not present

## 2016-09-13 DIAGNOSIS — J3089 Other allergic rhinitis: Secondary | ICD-10-CM | POA: Diagnosis not present

## 2016-09-13 DIAGNOSIS — Z6830 Body mass index (BMI) 30.0-30.9, adult: Secondary | ICD-10-CM | POA: Diagnosis not present

## 2016-10-08 ENCOUNTER — Ambulatory Visit: Payer: BLUE CROSS/BLUE SHIELD | Admitting: Podiatry

## 2016-10-09 DIAGNOSIS — M2550 Pain in unspecified joint: Secondary | ICD-10-CM | POA: Diagnosis not present

## 2016-10-09 DIAGNOSIS — E6609 Other obesity due to excess calories: Secondary | ICD-10-CM | POA: Diagnosis not present

## 2016-10-09 DIAGNOSIS — J3089 Other allergic rhinitis: Secondary | ICD-10-CM | POA: Diagnosis not present

## 2016-10-09 DIAGNOSIS — Z6830 Body mass index (BMI) 30.0-30.9, adult: Secondary | ICD-10-CM | POA: Diagnosis not present

## 2016-10-12 ENCOUNTER — Ambulatory Visit (INDEPENDENT_AMBULATORY_CARE_PROVIDER_SITE_OTHER): Payer: BLUE CROSS/BLUE SHIELD | Admitting: Podiatry

## 2016-10-12 ENCOUNTER — Ambulatory Visit: Payer: BLUE CROSS/BLUE SHIELD | Admitting: Podiatry

## 2016-10-12 ENCOUNTER — Ambulatory Visit (INDEPENDENT_AMBULATORY_CARE_PROVIDER_SITE_OTHER): Payer: BLUE CROSS/BLUE SHIELD

## 2016-10-12 ENCOUNTER — Other Ambulatory Visit: Payer: Self-pay | Admitting: Podiatry

## 2016-10-12 ENCOUNTER — Encounter: Payer: Self-pay | Admitting: Podiatry

## 2016-10-12 DIAGNOSIS — M25562 Pain in left knee: Secondary | ICD-10-CM | POA: Diagnosis not present

## 2016-10-12 DIAGNOSIS — M722 Plantar fascial fibromatosis: Secondary | ICD-10-CM

## 2016-10-12 DIAGNOSIS — G8929 Other chronic pain: Secondary | ICD-10-CM | POA: Diagnosis not present

## 2016-10-12 DIAGNOSIS — M79672 Pain in left foot: Secondary | ICD-10-CM

## 2016-10-12 DIAGNOSIS — M1711 Unilateral primary osteoarthritis, right knee: Secondary | ICD-10-CM | POA: Diagnosis not present

## 2016-10-12 DIAGNOSIS — Z96652 Presence of left artificial knee joint: Secondary | ICD-10-CM | POA: Diagnosis not present

## 2016-10-12 DIAGNOSIS — M79671 Pain in right foot: Secondary | ICD-10-CM

## 2016-10-12 DIAGNOSIS — M25561 Pain in right knee: Secondary | ICD-10-CM | POA: Diagnosis not present

## 2016-10-12 MED ORDER — TRIAMCINOLONE ACETONIDE 10 MG/ML IJ SUSP
10.0000 mg | Freq: Once | INTRAMUSCULAR | Status: AC
  Administered 2016-10-12: 10 mg

## 2016-10-13 NOTE — Progress Notes (Signed)
Subjective:    Patient ID: Joyce Herrera, female   DOB: 56 y.o.   MRN: [id number]   HPI patient presents stating both of her heels are hurting her again    ROS      Objective:  Physical Exam neurovascular status intact with intense discomfort plantar fascia bilateral     Assessment:   Return the plantar fasciitis bilateral      Plan:   Reinjected the plantar fascia bilateral 3 mg Kenalog 5 mg Xylocaine and instructed on physical therapy

## 2016-10-16 DIAGNOSIS — Z6830 Body mass index (BMI) 30.0-30.9, adult: Secondary | ICD-10-CM | POA: Diagnosis not present

## 2016-10-16 DIAGNOSIS — F329 Major depressive disorder, single episode, unspecified: Secondary | ICD-10-CM | POA: Diagnosis not present

## 2016-10-16 DIAGNOSIS — R600 Localized edema: Secondary | ICD-10-CM | POA: Diagnosis not present

## 2016-10-16 DIAGNOSIS — Z0001 Encounter for general adult medical examination with abnormal findings: Secondary | ICD-10-CM | POA: Diagnosis not present

## 2016-10-16 DIAGNOSIS — I872 Venous insufficiency (chronic) (peripheral): Secondary | ICD-10-CM | POA: Diagnosis not present

## 2016-10-16 DIAGNOSIS — E6609 Other obesity due to excess calories: Secondary | ICD-10-CM | POA: Diagnosis not present

## 2016-10-16 DIAGNOSIS — G894 Chronic pain syndrome: Secondary | ICD-10-CM | POA: Diagnosis not present

## 2016-10-16 DIAGNOSIS — M2550 Pain in unspecified joint: Secondary | ICD-10-CM | POA: Diagnosis not present

## 2016-10-16 DIAGNOSIS — J9801 Acute bronchospasm: Secondary | ICD-10-CM | POA: Diagnosis not present

## 2016-10-16 DIAGNOSIS — Z1389 Encounter for screening for other disorder: Secondary | ICD-10-CM | POA: Diagnosis not present

## 2016-10-17 ENCOUNTER — Other Ambulatory Visit: Payer: Self-pay | Admitting: Allergy & Immunology

## 2016-10-17 DIAGNOSIS — R5383 Other fatigue: Secondary | ICD-10-CM | POA: Diagnosis not present

## 2016-10-23 ENCOUNTER — Ambulatory Visit (INDEPENDENT_AMBULATORY_CARE_PROVIDER_SITE_OTHER): Payer: BLUE CROSS/BLUE SHIELD | Admitting: Allergy & Immunology

## 2016-10-23 ENCOUNTER — Encounter: Payer: Self-pay | Admitting: Allergy & Immunology

## 2016-10-23 VITALS — BP 102/70 | HR 73 | Temp 97.6°F | Resp 18

## 2016-10-23 DIAGNOSIS — Z889 Allergy status to unspecified drugs, medicaments and biological substances status: Secondary | ICD-10-CM | POA: Diagnosis not present

## 2016-10-23 DIAGNOSIS — G8929 Other chronic pain: Secondary | ICD-10-CM

## 2016-10-23 DIAGNOSIS — J4540 Moderate persistent asthma, uncomplicated: Secondary | ICD-10-CM | POA: Diagnosis not present

## 2016-10-23 DIAGNOSIS — L230 Allergic contact dermatitis due to metals: Secondary | ICD-10-CM

## 2016-10-23 DIAGNOSIS — R51 Headache: Secondary | ICD-10-CM

## 2016-10-23 DIAGNOSIS — R519 Headache, unspecified: Secondary | ICD-10-CM

## 2016-10-23 DIAGNOSIS — J301 Allergic rhinitis due to pollen: Secondary | ICD-10-CM | POA: Diagnosis not present

## 2016-10-23 DIAGNOSIS — K2100 Gastro-esophageal reflux disease with esophagitis, without bleeding: Secondary | ICD-10-CM

## 2016-10-23 MED ORDER — AZELASTINE HCL 0.1 % NA SOLN: 2.0000 | Freq: Two times a day (BID) | NASAL | 5 refills | Status: DC | PRN

## 2016-10-23 MED ORDER — BUDESONIDE-FORMOTEROL FUMARATE 160-4.5 MCG/ACT IN AERO: 2.0000 | INHALATION_SPRAY | Freq: Two times a day (BID) | RESPIRATORY_TRACT | 3 refills | Status: DC

## 2016-10-23 MED ORDER — FLUTICASONE PROPIONATE 50 MCG/ACT NA SUSP: NASAL | 5 refills | Status: DC

## 2016-10-23 NOTE — Patient Instructions (Addendum)
1. Moderate persistent asthma, uncomplicated - Lung function did showed significant improvement after nebulizer treatment - We will start you on a twice a day medication - Daily controller medication(s): Symbicort 160/4.5 2 puff twice a day with a spacer - Rescue medications: albuterol 4 puffs every 4-6 hours as needed - Asthma control goals:  * Full participation in all desired activities (may need albuterol before activity) * Albuterol use two time or less a week on average (not counting use with activity) * Cough interfering with sleep two time or less a month * Oral steroids no more than once a year * No hospitalizations  2. Seasonal allergic rhinitis - Previous testing positive to trees, grasses and molds. Continue Oralair. - Continue avoidance measures  - Continuet Flonase and Azelastine daily and may also use antihistamines as needed, although you have not done well with these in the past.   3. GERD - Continue with Nexium 40 mg as needed.  4. Follow-up in 3 months  Please inform us of any Emergency Department visits, hospitalizations, or changes in symptoms. Call us before going to the ED for breathing or allergy symptoms since we might be able to fit you in for a sick visit. Feel free to contact us anytime with any questions, problems, or concerns.  It was a pleasure to meet you today! Happy summer!   Websites that have reliable patient information: 1. American Academy of Asthma, Allergy, and Immunology: www.aaaai.org 2. Food Allergy Research and Education (FARE): foodallergy.org 3. Mothers of Asthmatics: http://www.asthmacommunitynetwork.org 4. American College of Allergy, Asthma, and Immunology: www.acaai.org

## 2016-10-23 NOTE — Progress Notes (Signed)
FOLLOW UP  Date of Service/Encounter:  10/23/16   Assessment:   Moderate persistent asthma, uncomplicated  Seasonal allergic rhinitis due to pollen  Chronic nonintractable headache  Drug allergy  Allergic contact dermatitis due to metals  Gastroesophageal reflux disease with esophagitis   Asthma Reportables:  Severity: moderate persistent  Risk: medium Control: not well controlled  Seasonal Influenza Vaccine: no but encouraged    Plan/Recommendations:   1. Moderate persistent asthma, uncomplicated - Lung function did showed significant improvement after nebulizer treatment - We will start you on a twice a day medication - Daily controller medication(s): Symbicort 160/4.5 2 puff twice a day with a spacer - Rescue medications: albuterol 4 puffs every 4-6 hours as needed - Asthma control goals:  * Full participation in all desired activities (may need albuterol before activity) * Albuterol use two time or less a week on average (not counting use with activity) * Cough interfering with sleep two time or less a month * Oral steroids no more than once a year * No hospitalizations  2. Seasonal allergic rhinitis - Previous testing positive to trees, grasses and molds. Continue Oralair. - Continue avoidance measures  - Continuet Flonase and Azelastine daily and may also use antihistamines as needed, although you have not done well with these in the past.   3. GERD - Continue with Nexium 40 mg as needed.  4. Follow-up in 3 months   Subjective:   Joyce Herrera is a 56 y.o. female presenting today for follow up of  Chief Complaint  Patient presents with  . Asthma  . Allergic Rhinitis   . Cough    Joyce Herrera has a history of the following: Patient Active Problem List   Diagnosis Date Noted  . Vaginal irritation 07/19/2016  . Urinary frequency 07/19/2016  . Pelvic relaxation due to cystocele, midline 07/19/2016  . Atrophic vaginitis 07/19/2016  .  Seasonal allergic rhinitis due to pollen 07/17/2016  . Moderate persistent asthma, uncomplicated 75/10/2583  . Drug allergy 07/17/2016  . Chronic nonintractable headache 07/17/2016  . Allergic contact dermatitis due to metals 07/17/2016  . Class 1 obesity due to excess calories without serious comorbidity with body mass index (BMI) of 30.0 to 30.9 in adult 03/14/2016  . Family hx of colon cancer 09/16/2015  . History of colonic polyps 09/16/2015  . Subclinical hyperthyroidism 08/24/2015  . Hyperlipidemia 08/24/2015    History obtained from: chart review and patient interview.  Joyce Herrera Primary Care Provider is Redmond School, MD.     Joyce Herrera is a 56 y.o. female presenting for a follow up visit where she was evaluated for moderate persistent asthma, seasonal allergic rhinitis (grasses, trees, and mold), chronic intractable headache, contact dermatitis, and multiple drug allergies. At that visit she was prescribes Breo Ellipta 100/25, Oralair, and Dymista, Avoidance measures were provided for mold.   Since the last visit, she reports doing well overall. Her last visit was 07/17/2016. She reports feeling short of breath when she talks at any length and her voice changes from strong to a whisper the longer she talks. She has been coughing for the last 2 years with no change since the last visit. There has been no limitation in activity level due to shortness of breath, wheezing, or cough. She has not used the ProAir inhaler since last visit. She reports feeling that the [facility] inhaler is not working for her as she can not hear the medication expel or feel it in her  throat. She has been using QVar 80 that she received from another source. She has not been to the emergency department or received prednisone since her last visit to this clinic.  Joyce Herrera has experienced a decrease in nasal drainage and headaches since beginning Oralair. She denies any side effects. She is using Flonase and  Astelin separately as her insurance did not cover Dymista.   She reports some heartburn which is mostly at night after lying down which she controls with Nexium as needed. Caffeine consumption is daily in the form of diet Coke. Joyce Herrera has not been wearing any jewelry with metals that irritate her skin and has not experienced any skin rashes. She has also avoided medications such as penicillin and cephalosporin.   Otherwise, there have been no changes to her past medical history, surgical history, family history, or social history.  Review of Systems: a 14-point review of systems is pertinent for what is mentioned in HPI.  Otherwise, all other systems were negative. Constitutional: negative other than that listed in the HPI Eyes: negative other than that listed in the HPI Ears, nose, mouth, throat, and face: negative other than that listed in the HPI Respiratory: negative other than that listed in the HPI Cardiovascular: negative other than that listed in the HPI Gastrointestinal: negative other than that listed in the HPI Genitourinary: negative other than that listed in the HPI Integument: negative other than that listed in the HPI Hematologic: negative other than that listed in the HPI Musculoskeletal: negative other than that listed in the HPI Neurological: negative other than that listed in the HPI Allergy/Immunologic: negative other than that listed in the HPI    Objective:   Blood pressure 102/70, pulse 73, temperature 97.6 F (36.4 C), temperature source Oral, resp. rate 18, last menstrual period 01/30/1992, SpO2 95 %. There is no height or weight on file to calculate BMI.   Physical Exam:  General: Alert, interactive, in no acute distress. Eyes: No conjunctival injection present on the right and No conjunctival injection present on the left Ears: Right TM pearly gray with normal light reflex and Left TM pearly gray with normal light reflex.  Nose/Throat: External nose within  normal limits and septum midline, turbinates mildly edematous with clear discharge, post-pharynx moderately erythematous without cobblestoning in the posterior oropharynx. Tonsils unremarklable without exudates Neck: Supple without thyromegaly. Lungs: Clear to auscultation without wheezing, rhonchi or rales. No increased work of breathing. CV: Normal S1/S2, no murmurs. Capillary refill <2 seconds.  Skin: Warm and dry, without lesions or rashes. Neuro:   Grossly intact. No focal deficits appreciated. Responsive to questions.  Spirometry: results abnormal (FEV1: 1.34/54%, FVC: 2.49/85%, FEV1/FVC: .54/69%).    Spirometry consistent with moderate obstructive disease. Albuterol/Atrovent nebulizer treatment given in clinic with significant improvement. FVC: 2.71, FEV1: 1.73, FEV1R 63%.   Gareth Morgan, FNP [facility]    I performed a history and physical examination of the patient and discussed his management with the Nurse Practitioner. I reviewed the resident's note and agree with the documented findings and plan of care. The note in its entirety was edited by myself, including the physical exam, assessment, and plan.   Salvatore Marvel, MD [facility]

## 2016-10-24 ENCOUNTER — Other Ambulatory Visit: Payer: Self-pay | Admitting: Allergy & Immunology

## 2016-10-24 ENCOUNTER — Other Ambulatory Visit (HOSPITAL_COMMUNITY): Payer: Self-pay | Admitting: Internal Medicine

## 2016-10-24 NOTE — Telephone Encounter (Signed)
Patient needs a refill on [facility]IR [facility]

## 2016-10-25 ENCOUNTER — Other Ambulatory Visit (HOSPITAL_COMMUNITY): Payer: Self-pay | Admitting: Internal Medicine

## 2016-10-25 DIAGNOSIS — Z09 Encounter for follow-up examination after completed treatment for conditions other than malignant neoplasm: Secondary | ICD-10-CM

## 2016-10-25 DIAGNOSIS — N6321 Unspecified lump in the left breast, upper outer quadrant: Secondary | ICD-10-CM

## 2016-10-25 DIAGNOSIS — N6320 Unspecified lump in the left breast, unspecified quadrant: Secondary | ICD-10-CM

## 2016-10-25 MED ORDER — ORALAIR 300 IR SL SUBL: 1.0000 | SUBLINGUAL_TABLET | Freq: Every day | SUBLINGUAL | 5 refills | Status: DC

## 2016-10-25 NOTE — Telephone Encounter (Signed)
Medication was sent into the pharmacy.  

## 2016-11-19 ENCOUNTER — Other Ambulatory Visit: Payer: Self-pay | Admitting: Allergy & Immunology

## 2016-11-22 DIAGNOSIS — M79676 Pain in unspecified toe(s): Secondary | ICD-10-CM

## 2016-11-27 ENCOUNTER — Ambulatory Visit (HOSPITAL_COMMUNITY)
Admission: RE | Admit: 2016-11-27 | Discharge: 2016-11-27 | Disposition: A | Discharge status: Home / Self Care | Payer: BLUE CROSS/BLUE SHIELD | Source: Ambulatory Visit | Attending: Internal Medicine | Admitting: Internal Medicine

## 2016-11-27 DIAGNOSIS — N641 Fat necrosis of breast: Secondary | ICD-10-CM | POA: Diagnosis not present

## 2016-11-27 DIAGNOSIS — N6321 Unspecified lump in the left breast, upper outer quadrant: Secondary | ICD-10-CM | POA: Diagnosis not present

## 2016-11-27 DIAGNOSIS — R928 Other abnormal and inconclusive findings on diagnostic imaging of breast: Secondary | ICD-10-CM | POA: Diagnosis not present

## 2016-11-27 DIAGNOSIS — Z09 Encounter for follow-up examination after completed treatment for conditions other than malignant neoplasm: Secondary | ICD-10-CM

## 2016-11-27 DIAGNOSIS — N6320 Unspecified lump in the left breast, unspecified quadrant: Secondary | ICD-10-CM

## 2016-12-19 ENCOUNTER — Other Ambulatory Visit: Payer: Self-pay | Admitting: Allergy & Immunology

## 2016-12-19 DIAGNOSIS — G8929 Other chronic pain: Secondary | ICD-10-CM | POA: Diagnosis not present

## 2016-12-19 DIAGNOSIS — M25561 Pain in right knee: Secondary | ICD-10-CM | POA: Diagnosis not present

## 2016-12-24 DIAGNOSIS — D8989 Other specified disorders involving the immune mechanism, not elsewhere classified: Secondary | ICD-10-CM | POA: Diagnosis not present

## 2016-12-24 DIAGNOSIS — R768 Other specified abnormal immunological findings in serum: Secondary | ICD-10-CM | POA: Diagnosis not present

## 2016-12-26 DIAGNOSIS — M1711 Unilateral primary osteoarthritis, right knee: Secondary | ICD-10-CM | POA: Diagnosis not present

## 2016-12-31 DIAGNOSIS — G8929 Other chronic pain: Secondary | ICD-10-CM | POA: Diagnosis not present

## 2016-12-31 DIAGNOSIS — M25561 Pain in right knee: Secondary | ICD-10-CM | POA: Diagnosis not present

## 2016-12-31 DIAGNOSIS — M1711 Unilateral primary osteoarthritis, right knee: Secondary | ICD-10-CM | POA: Diagnosis not present

## 2017-01-03 DIAGNOSIS — E063 Autoimmune thyroiditis: Secondary | ICD-10-CM | POA: Diagnosis not present

## 2017-01-03 DIAGNOSIS — E6609 Other obesity due to excess calories: Secondary | ICD-10-CM | POA: Diagnosis not present

## 2017-01-03 DIAGNOSIS — Z6831 Body mass index (BMI) 31.0-31.9, adult: Secondary | ICD-10-CM | POA: Diagnosis not present

## 2017-01-03 DIAGNOSIS — M1990 Unspecified osteoarthritis, unspecified site: Secondary | ICD-10-CM | POA: Diagnosis not present

## 2017-01-03 DIAGNOSIS — M797 Fibromyalgia: Secondary | ICD-10-CM | POA: Diagnosis not present

## 2017-01-03 DIAGNOSIS — K219 Gastro-esophageal reflux disease without esophagitis: Secondary | ICD-10-CM | POA: Diagnosis not present

## 2017-01-03 DIAGNOSIS — I10 Essential (primary) hypertension: Secondary | ICD-10-CM | POA: Diagnosis not present

## 2017-01-03 DIAGNOSIS — G894 Chronic pain syndrome: Secondary | ICD-10-CM | POA: Diagnosis not present

## 2017-01-03 DIAGNOSIS — E782 Mixed hyperlipidemia: Secondary | ICD-10-CM | POA: Diagnosis not present

## 2017-01-15 ENCOUNTER — Ambulatory Visit (INDEPENDENT_AMBULATORY_CARE_PROVIDER_SITE_OTHER): Payer: BLUE CROSS/BLUE SHIELD | Admitting: Allergy & Immunology

## 2017-01-15 ENCOUNTER — Encounter: Payer: Self-pay | Admitting: Allergy & Immunology

## 2017-01-15 VITALS — BP 110/78 | HR 94 | Resp 18

## 2017-01-15 DIAGNOSIS — J4540 Moderate persistent asthma, uncomplicated: Secondary | ICD-10-CM | POA: Diagnosis not present

## 2017-01-15 NOTE — Patient Instructions (Addendum)
1. Moderate persistent asthma, uncomplicated - Lung function looks markedly improved today. - We will add on Spiriva to see if this can help with your breathing/coughing.   - Call us and let us know if you like that and we can send in the Spiriva.  - Daily controller medication(s): Symbicort 160/4.5 2 puff twice a day with a spacer + Spiriva two puffs once daily  - Rescue medications: albuterol 4 puffs every 4-6 hours as needed - Asthma control goals:  * Full participation in all desired activities (may need albuterol before activity) * Albuterol use two time or less a week on average (not counting use with activity) * Cough interfering with sleep two time or less a month * Oral steroids no more than once a year * No hospitalizations  2. Seasonal allergic rhinitis (trees, grasses and molds) - Continue Oralair. - Continue avoidance measures  - Continuet Flonase and Azelastine daily and may also use antihistamines as needed, although you have not done well with these in the past.   3. GERD - Continue with Nexium 40 mg.  4. Return in about 3 months (around 04/15/2017).   Please inform us of any Emergency Department visits, hospitalizations, or changes in symptoms. Call us before going to the ED for breathing or allergy symptoms since we might be able to fit you in for a sick visit. Feel free to contact us anytime with any questions, problems, or concerns.  It was a pleasure to see you again today! Enjoy the holiday season!  Websites that have reliable patient information: 1. American Academy of Asthma, Allergy, and Immunology: www.aaaai.org 2. Food Allergy Research and Education (FARE): foodallergy.org 3. Mothers of Asthmatics: http://www.asthmacommunitynetwork.org 4. American College of Allergy, Asthma, and Immunology: www.acaai.org

## 2017-01-15 NOTE — Progress Notes (Signed)
FOLLOW UP  Date of Service/Encounter:  01/15/17   Assessment:   Moderate persistent asthma, uncomplicated  Seasonal allergic rhinitis due to pollen  Chronic nonintractable headache  Drug allergy - penicillins  Allergic contact dermatitis due to metals  Gastroesophageal reflux disease  Fibromyalgia   Asthma Reportables:  Severity: moderate persistent  Risk: medium Control: not well controlled  Plan/Recommendations:   1. Moderate persistent asthma, uncomplicated - Lung function looks markedly improved today, but this is not consistent with her symptom reporting at this time. - I am unsure how to proceed with this treatment of the chronic cough since she has failed multiple cough treatments in the past (reflux, asthma; GERD; chronic bronchitis from cigarette smoking or other inhaled environmental irritants; non-asthmatic eosinophilic bronchitis; and bronchiectasis) - We will add on Spiriva to see if this can help with your breathing/coughing.   - Call us and let us know if you like that and we can send in the Spiriva.  - Daily controller medication(s): Symbicort 160/4.5 2 puff twice a day with a spacer + Spiriva two puffs once daily  - Rescue medications: albuterol 4 puffs every 4-6 hours as needed - Asthma control goals:  * Full participation in all desired activities (may need albuterol before activity) * Albuterol use two time or less a week on average (not counting use with activity) * Cough interfering with sleep two time or less a month * Oral steroids no more than once a year * No hospitalizations  2. Seasonal allergic rhinitis (trees, grasses and molds) - Continue Oralair. - Continue avoidance measures  - Continuet Flonase and Azelastine daily and may also use antihistamines as needed, although you have not done well with these in the past.   3. GERD - Continue with Nexium 40 mg.  4. Return in about 3 months (around 04/15/2017).  Subjective:   Joyce Herrera is a 56 y.o. female presenting today for follow up of  Chief Complaint  Patient presents with  . Asthma    Joyce Herrera has a history of the following: Patient Active Problem List   Diagnosis Date Noted  . Vaginal irritation 07/19/2016  . Urinary frequency 07/19/2016  . Pelvic relaxation due to cystocele, midline 07/19/2016  . Atrophic vaginitis 07/19/2016  . Seasonal allergic rhinitis due to pollen 07/17/2016  . Moderate persistent asthma, uncomplicated 50/53/9767  . Drug allergy 07/17/2016  . Chronic nonintractable headache 07/17/2016  . Allergic contact dermatitis due to metals 07/17/2016  . Class 1 obesity due to excess calories without serious comorbidity with body mass index (BMI) of 30.0 to 30.9 in adult 03/14/2016  . Family hx of colon cancer 09/16/2015  . History of colonic polyps 09/16/2015  . Subclinical hyperthyroidism 08/24/2015  . Hyperlipidemia 08/24/2015    History obtained from: chart review and patient.  Joyce Herrera's Primary Care Provider is Redmond School, MD.     Joyce Herrera is a 56 y.o. female presenting for a follow up visit. She was last seen in September 2018. She has a history of moderate persistent asthma, seasonal allergic rhinitis (grasses, trees, and mold), chronic intractable headache, contact dermatitis, and multiple drug allergies. At that visit, she had decreased lung function and was actually changed from [facility] to Symbicort since we thought that the multiple dosing throughout the day more prove more efficacious for her breathing problems. She also continued to endorse a chronic cough, which was unresponsive to multiple treatment modalities.   Since the last visit, she has done  well. She continues to have a shortness of breath despite the use of Symbicort. She is also reporting a chronic cough. She is concerned with lung cancer since her mother started with similar symptoms. It should be noted that she had a normal chest CT in the fall of  2017. In any case, she continues to have a cough that is worse at night. She does have some hoarseness of her voice as well. She does continue to have SOB episodes despite the Symbicort. She will talk and become short winded. She has not been on Spiriva to her knowledge.   She does have a history of GERD and is on Nexium. She remains on the nasal sprays for postnasal drip. He has been on gabapentin in the past for her cough without improvement. She is also currently on Lyrica for her fibromyalgia. She has always had a long-standing concern with "intermittent thyroiditis" and is followed by  Dr. Dorris Fetch, who does not agree with the diagnosis at all.   Otherwise, there have been no changes to her past medical history, surgical history, family history, or social history.    Review of Systems: a 14-point review of systems is pertinent for what is mentioned in HPI.  Otherwise, all other systems were negative. Constitutional: negative other than that listed in the HPI Eyes: negative other than that listed in the HPI Ears, nose, mouth, throat, and face: negative other than that listed in the HPI Respiratory: negative other than that listed in the HPI Cardiovascular: negative other than that listed in the HPI Gastrointestinal: negative other than that listed in the HPI Genitourinary: negative other than that listed in the HPI Integument: negative other than that listed in the HPI Hematologic: negative other than that listed in the HPI Musculoskeletal: negative other than that listed in the HPI Neurological: negative other than that listed in the HPI Allergy/Immunologic: negative other than that listed in the HPI    Objective:   Blood pressure 110/78, pulse 94, resp. rate 18, last menstrual period 01/30/1992, SpO2 95 %. There is no height or weight on file to calculate BMI.   Physical Exam:  General: Alert, interactive, in no acute distress. Somewhat dysthymic personality.  Eyes: No conjunctival  injection bilaterally, no discharge on the right, no discharge on the left and no Horner-Trantas dots present. PERRL bilaterally. EOMI without pain. No photophobia.  Ears: Right TM pearly gray with normal light reflex, Left TM pearly gray with normal light reflex, Right TM intact without perforation and Left TM intact without perforation.  Nose/Throat: External nose within normal limits and septum midline. Turbinates edematous and pale with clear discharge. Posterior oropharynx mildly erythematous without cobblestoning in the posterior oropharynx. Tonsils 2+ without exudates.  Tongue without thrush. Adenopathy: no enlarged lymph nodes appreciated in the anterior cervical, occipital, axillary, epitrochlear, inguinal, or popliteal regions. Lungs: Mildly decreased breath sounds with expiratory wheezing bilaterally. No increased work of breathing. CV: Normal S1/S2. No murmurs. Capillary refill <2 seconds.  Skin: Warm and dry, without lesions or rashes. Neuro:   Grossly intact. No focal deficits appreciated. Responsive to questions.  Diagnostic studies:    Spirometry: results abnormal (FEV1: 1.84/75%, FVC: 3.41/117%, FEV1/FVC: 54%).    Spirometry consistent with moderate obstructive disease. However, this was markedly improved compared to previous studies.        Date FVC FEV1 FEV1/FVC   01/15/2017 3.41 1.84 0.54   10/23/2016 2.49 1.34 0.54   07/17/2016 3.38 1.76 0.52   07/17/2016 2.71 1.52 0.56  Allergy Studies: none     Salvatore Marvel, MD [facility] [place]

## 2017-01-23 ENCOUNTER — Other Ambulatory Visit: Payer: Self-pay | Admitting: "Endocrinology

## 2017-01-26 IMAGING — MG 2D DIGITAL DIAGNOSTIC BILATERAL MAMMOGRAM WITH CAD AND ADJUNCT T
5 of 9 series · 5 of 25 positions shown · non-contrast
Comparison: Previous exam(s).

ACR Breast Density Category a: The breast tissue is almost entirely
fatty.

CLINICAL DATA: 55-year-old female presenting for evaluation of a
painful area of concern in the left breast for over 3 months. The
patient states she also had trauma to the upper-inner left breast
after hitting a door.

EXAM:
2D DIGITAL DIAGNOSTIC BILATERAL MAMMOGRAM WITH CAD AND ADJUNCT TOMO
ULTRASOUND LEFT BREAST

[L TAN]
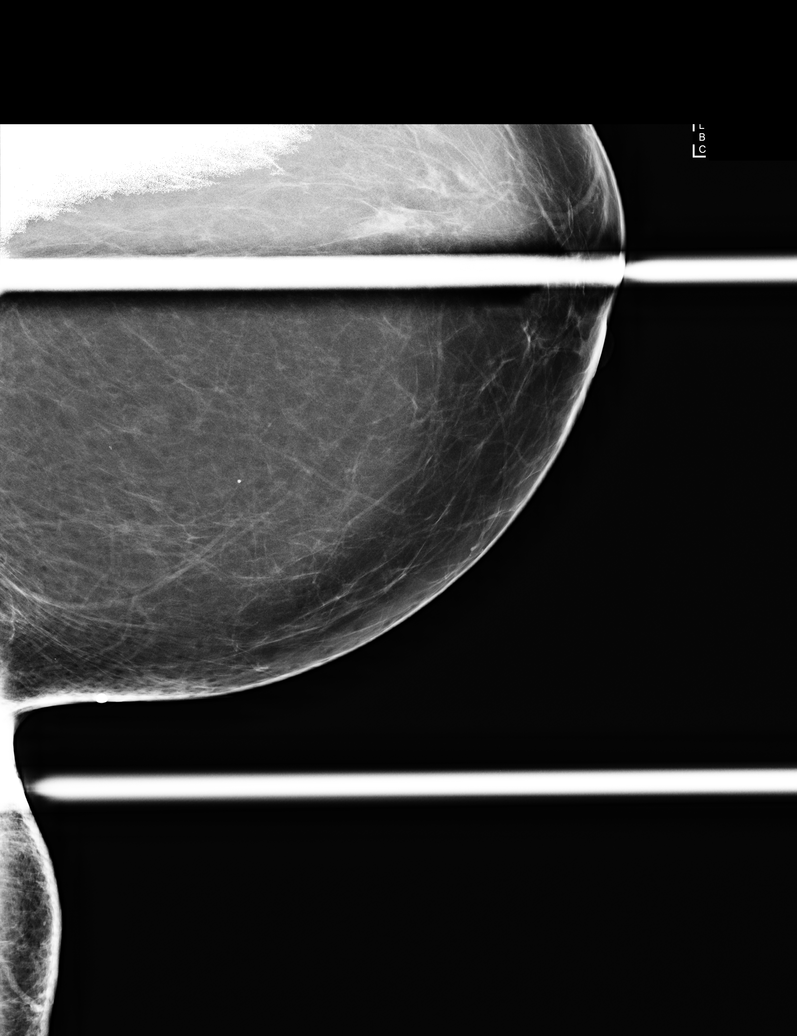

[R MLO]
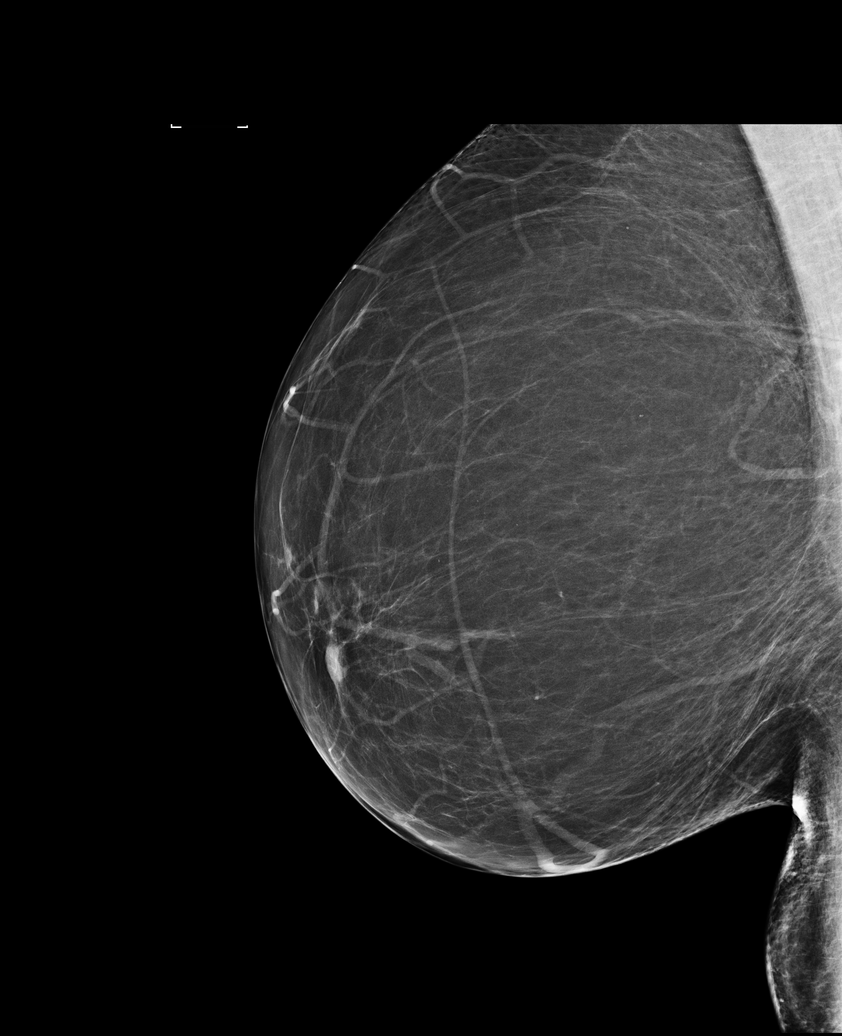

[L MLO]
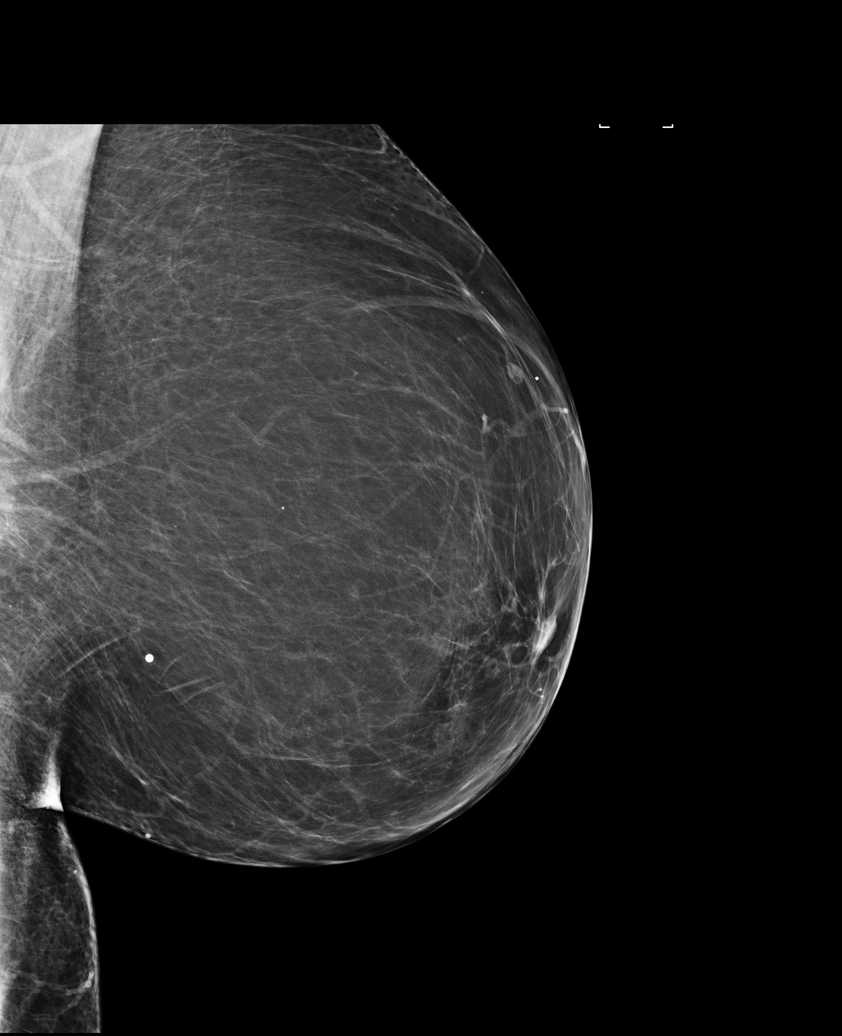

[L CC]
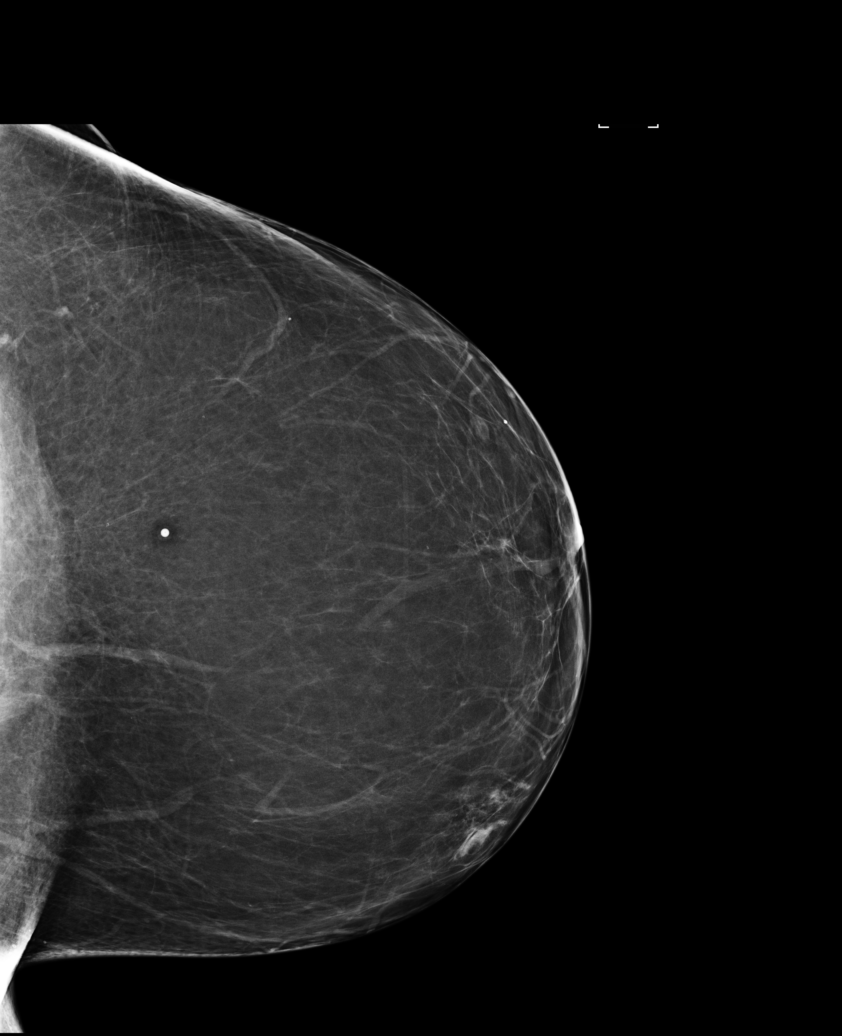

[R CC]
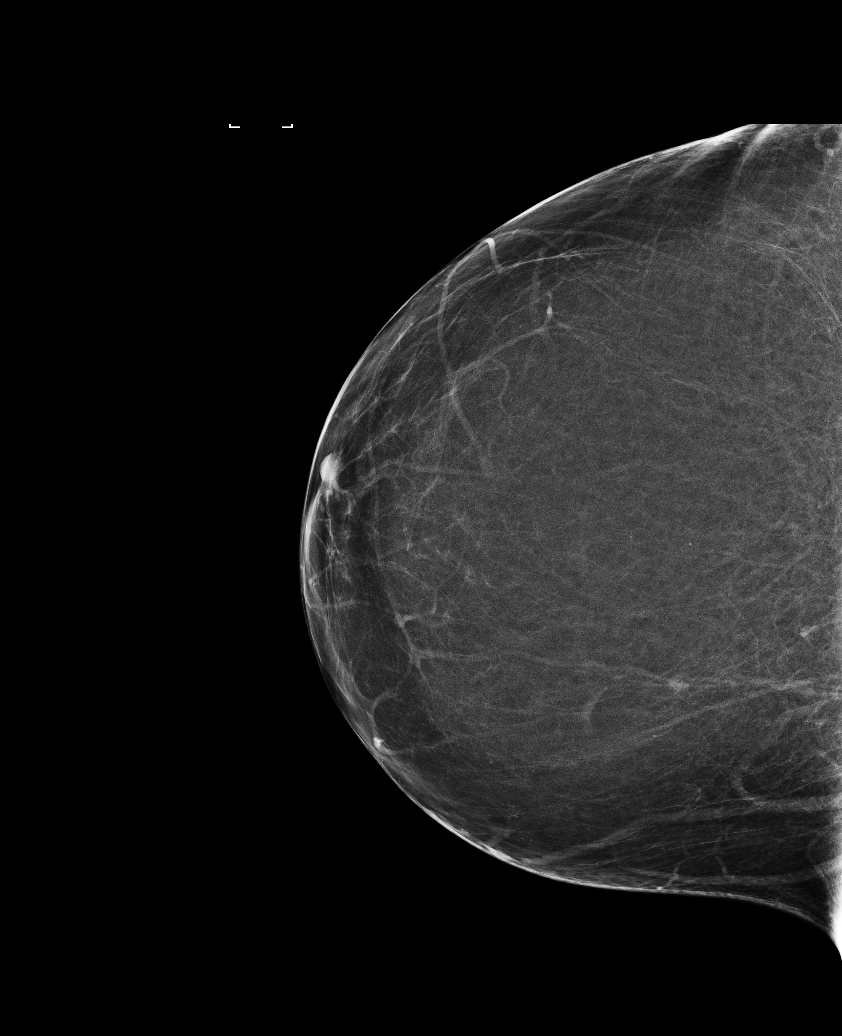

[5 of 25 positions shown; findings below may reference images not displayed]

FINDINGS: A BB has been placed on the inferior aspect of the left breast near
the inframammary fold indicating the site of pain. There are no
suspicious mammographic finding seen deep to this marker. In the
lower-inner quadrant of the left breast, anterior depth there is an
asymmetry in the superficial aspect of the breast spanning
approximately 2.6 cm, and is interspersed with fat. In the
upper-outer left breast, anterior depth there is a superficial oval
circumscribed mass measuring approximately 6 mm. There does appear
to be fat within the mass mammographically. No other suspicious
calcifications, masses or areas of distortion are seen in the
bilateral breasts.

Mammographic images were processed with CAD.

On physical exam, no discrete palpable mass is identified in the
inferior left breast at the site of pain. There is slight bruising
on the upper inner quadrant of the left breast at the site of
suspected fat necrosis. No palpable masses are identified. No
palpable masses are identified in the upper-outer quadrant of the
left breast.

Targeted ultrasound is performed, showing normal fibroglandular
tissue at the site of pain at 6 o'clock in the left breast.

Ultrasound of the left breast at 10 o'clock, 8 cm from the nipple
demonstrates a superficial area of increased echogenicity with
anechoic cystic masses, consistent with fat necrosis. This is deep
to the superficial skin bruising. In the upper-outer quadrant of the
left breast at 1 o'clock, 4 cm from the nipple there is a
circumscribed isoechoic oval mass measuring 7 x 3 x 5 mm. This may
represent fat necrosis as well, likely corresponding with the mass
identified mammographically.
IMPRESSION: 1. No mammographic or targeted sonographic correlate for the site of
pain in the inferior left breast.

2. There is a benign area of fat necrosis in the upper inner left
breast at the site of bruising.

3. There is a probably benign mass in the upper-outer left breast at
1 o'clock, which may represent an additional site of fat necrosis.

4.  No mammographic evidence of right breast malignancy.

RECOMMENDATION:
1. Clinical follow-up recommended for the painful area of concern in
the left breast. Any further workup should be based on clinical
grounds.

2. Six-month follow-up diagnostic left breast mammogram and possible
ultrasound is recommended for evaluation of the probably benign left
breast mass at 1 o'clock.

I have discussed the findings and recommendations with the patient.
Results were also provided in writing at the conclusion of the
visit. If applicable, a reminder letter will be sent to the patient
regarding the next appointment.

BI-RADS CATEGORY  3: Probably benign.

## 2017-02-14 DIAGNOSIS — I10 Essential (primary) hypertension: Secondary | ICD-10-CM | POA: Diagnosis not present

## 2017-02-14 DIAGNOSIS — F329 Major depressive disorder, single episode, unspecified: Secondary | ICD-10-CM | POA: Diagnosis not present

## 2017-02-14 DIAGNOSIS — G894 Chronic pain syndrome: Secondary | ICD-10-CM | POA: Diagnosis not present

## 2017-02-14 DIAGNOSIS — E782 Mixed hyperlipidemia: Secondary | ICD-10-CM | POA: Diagnosis not present

## 2017-02-14 DIAGNOSIS — Z6830 Body mass index (BMI) 30.0-30.9, adult: Secondary | ICD-10-CM | POA: Diagnosis not present

## 2017-02-14 DIAGNOSIS — E6609 Other obesity due to excess calories: Secondary | ICD-10-CM | POA: Diagnosis not present

## 2017-02-14 DIAGNOSIS — J019 Acute sinusitis, unspecified: Secondary | ICD-10-CM | POA: Diagnosis not present

## 2017-02-14 DIAGNOSIS — E063 Autoimmune thyroiditis: Secondary | ICD-10-CM | POA: Diagnosis not present

## 2017-02-14 DIAGNOSIS — Z1389 Encounter for screening for other disorder: Secondary | ICD-10-CM | POA: Diagnosis not present

## 2017-03-14 ENCOUNTER — Ambulatory Visit: Payer: BLUE CROSS/BLUE SHIELD | Admitting: "Endocrinology

## 2017-03-17 ENCOUNTER — Other Ambulatory Visit: Payer: Self-pay | Admitting: Allergy & Immunology

## 2017-04-23 ENCOUNTER — Encounter: Payer: Self-pay | Admitting: Allergy & Immunology

## 2017-04-23 ENCOUNTER — Ambulatory Visit: Payer: BLUE CROSS/BLUE SHIELD | Admitting: Allergy & Immunology

## 2017-04-23 VITALS — BP 106/66 | HR 96 | Resp 17 | Wt 185.8 lb

## 2017-04-23 DIAGNOSIS — R1310 Dysphagia, unspecified: Secondary | ICD-10-CM | POA: Diagnosis not present

## 2017-04-23 DIAGNOSIS — K2100 Gastro-esophageal reflux disease with esophagitis, without bleeding: Secondary | ICD-10-CM | POA: Insufficient documentation

## 2017-04-23 DIAGNOSIS — J301 Allergic rhinitis due to pollen: Secondary | ICD-10-CM | POA: Diagnosis not present

## 2017-04-23 DIAGNOSIS — R0602 Shortness of breath: Secondary | ICD-10-CM | POA: Diagnosis not present

## 2017-04-23 MED ORDER — FLUTICASONE PROPIONATE 93 MCG/ACT NA EXHU: 2.0000 | INHALANT_SUSPENSION | Freq: Two times a day (BID) | NASAL | 5 refills | Status: DC

## 2017-04-23 MED ORDER — ORALAIR 300 IR SL SUBL
SUBLINGUAL_TABLET | SUBLINGUAL | 5 refills | Status: DC
Start: 2017-04-23 — End: 2017-12-25

## 2017-04-23 MED ORDER — ESOMEPRAZOLE MAGNESIUM 40 MG PO CPDR: 40.0000 mg | DELAYED_RELEASE_CAPSULE | Freq: Every day | ORAL | 5 refills | Status: DC

## 2017-04-23 NOTE — Progress Notes (Signed)
FOLLOW UP  Date of Service/Encounter:  04/23/17   Assessment:   Shortness of breath - not responsive to asthma medications   Perennial and seasonal allergic rhinitis (grasses, trees, outdoor molds, cockroach)  Chronic nonintractable headache  Drug allergy - penicillins  Allergic contact dermatitis due to metals  Gastroesophageal reflux disease  Fibromyalgia   Plan/Recommendations:   1. Moderate persistent asthma, uncomplicated - Lung function looks markedly improved today, but since you are doing the same without the inhalers that is fine to remain off of them.  - I think more of your breathing problems might be related to your difficulty swallowing. - The difficulty swallowing seems more consistent with a GI issue such as EoE or uncontrolled reflux.   2. Seasonal allergic rhinitis (trees, grasses, molds, cockroach) - Continue Oralair. - Stop the nasal sprays that you are on. - Start Xhance two sprays per nostril up to twice daily. - When the pharmacy calls to confirm your address, ask to be enrolled in the auto refill program. - This should allow you to have the drug for free for one year.  3. GERD - Continue with Nexium 40 mg. - We will refer you back to your GI doctor to get you in faster.  - I am concerned that you might have eosinophilic esophagitis.   4. Return in about 6 months (around 10/24/2017).   Subjective:   Joyce Herrera is a 57 y.o. female presenting today for follow up of  Chief Complaint  Patient presents with  . Asthma    Joyce Herrera has a history of the following: Patient Active Problem List   Diagnosis Date Noted  . Gastroesophageal reflux disease 04/23/2017  . Vaginal irritation 07/19/2016  . Urinary frequency 07/19/2016  . Pelvic relaxation due to cystocele, midline 07/19/2016  . Atrophic vaginitis 07/19/2016  . Seasonal allergic rhinitis due to pollen 07/17/2016  . Moderate persistent asthma, uncomplicated 61/60/7371   . Drug allergy 07/17/2016  . Chronic nonintractable headache 07/17/2016  . Allergic contact dermatitis due to metals 07/17/2016  . Class 1 obesity due to excess calories without serious comorbidity with body mass index (BMI) of 30.0 to 30.9 in adult 03/14/2016  . Family hx of colon cancer 09/16/2015  . History of colonic polyps 09/16/2015  . Subclinical hyperthyroidism 08/24/2015  . Hyperlipidemia 08/24/2015    History obtained from: chart review and patient.  Joyce Herrera's Primary Care Provider is Redmond School, MD.     Joyce Herrera is a 58 y.o. female presenting for a follow up visit. She ws last seen in December 2018. At that time, her lung function was improved. We added on Spiriva two puffs once daily in addition to her Symbicort two puffs BID. She also has a history of seasonal AR with sensitization to trees, grasses, and molds. For her GERD, we continued her on Nexium 40mg  once daily.   Since the last visit, she has done well. She has taken herself off of the Symbicort and the Spiriva because neither of them seemed to be helping her SOB. She is just treating the SOB by "shutting up". She has not needed to go to the hospital for her symptoms at all. She has continued to have a cough despite all of our interventions.   From a rhinitis perspective, she has continued to have congestion at night. This is fine during the daytime, but then she develops marked congestion when she wakes up in the morning. She is using the nose sprays, but  this does not seem to be helping at all.   She did quit the Oralair for two months but then stopped it since spring was coming up. She is an outdoor person so she felt that she needed the protection for the upcoming season.   She has seen a gastroenterologist and an endoscopy performed "years ago". She does have the "reflux junk". She sees Dr. Hildred Laser for her routine colonoscopies, but she has never discussed her throat with him. She would like another  referral to Dr. Laural Golden for an evaluation of her throat.   Otherwise, there have been no changes to her past medical history, surgical history, family history, or social history.    Review of Systems: a 14-point review of systems is pertinent for what is mentioned in HPI.  Otherwise, all other systems were negative. Constitutional: negative other than that listed in the HPI Eyes: negative other than that listed in the HPI Ears, nose, mouth, throat, and face: negative other than that listed in the HPI Respiratory: negative other than that listed in the HPI Cardiovascular: negative other than that listed in the HPI Gastrointestinal: negative other than that listed in the HPI Genitourinary: negative other than that listed in the HPI Integument: negative other than that listed in the HPI Hematologic: negative other than that listed in the HPI Musculoskeletal: negative other than that listed in the HPI Neurological: negative other than that listed in the HPI Allergy/Immunologic: negative other than that listed in the HPI    Objective:   Blood pressure 106/66, pulse 96, resp. rate 17, weight 185 lb 12.8 oz (84.3 kg), last menstrual period 01/30/1992, SpO2 92 %. Body mass index is 30.92 kg/m.   Physical Exam:  General: Alert, interactive, in no acute distress. Somewhat flat affect.  Eyes: No conjunctival injection bilaterally, no discharge on the right, no discharge on the left and no Horner-Trantas dots present. PERRL bilaterally. EOMI without pain. No photophobia.  Ears: Right TM pearly gray with normal light reflex, Left TM pearly gray with normal light reflex, Right TM intact without perforation and Left TM intact without perforation.  Nose/Throat: External nose within normal limits and septum midline. Turbinates edematous with clear discharge. Posterior oropharynx erythematous without cobblestoning in the posterior oropharynx. Tonsils 2+ without exudates.  Tongue without  thrush. Lungs: Clear to auscultation without wheezing, rhonchi or rales. No increased work of breathing. CV: Normal S1/S2. No murmurs. Capillary refill <2 seconds.  Skin: Warm and dry, without lesions or rashes. Neuro:   Grossly intact. No focal deficits appreciated. Responsive to questions.  Diagnostic studies:   Spirometry: results normal (FEV1: 1.90/80%, FVC: 2.48/77%, FEV1/FVC: 76%).    Spirometry consistent with normal pattern.   Allergy Studies: none      Salvatore Marvel, MD [place] [place]

## 2017-04-23 NOTE — Patient Instructions (Addendum)
1. Moderate persistent asthma, uncomplicated - Lung function looks markedly improved today, but since you are doing the same without the inhalers that is fine to remain off of them.  - I think more of your breathing problems might be related to your difficulty swallowing.  2. Seasonal allergic rhinitis (trees, grasses and molds) - Continue Oralair. - Stop the nasal sprays that you are on. - Start Xhance two sprays per nostril up to twice daily. - When the pharmacy calls to confirm your address, ask to be enrolled in the auto refill program. - This should allow you to have the drug for free for one year.  3. GERD - Continue with Nexium 40 mg. - We will refer you back to your GI doctor to get you in faster.  - I am concerned that you might have eosinophilic esophagitis.   4. Return in about 6 months (around 10/24/2017).   Please inform us of any Emergency Department visits, hospitalizations, or changes in symptoms. Call us before going to the ED for breathing or allergy symptoms since we might be able to fit you in for a sick visit. Feel free to contact us anytime with any questions, problems, or concerns.  It was a pleasure to see you again today!  Websites that have reliable patient information: 1. American Academy of Asthma, Allergy, and Immunology: www.aaaai.org 2. Food Allergy Research and Education (FARE): foodallergy.org 3. Mothers of Asthmatics: http://www.asthmacommunitynetwork.org 4. American College of Allergy, Asthma, and Immunology: www.acaai.org

## 2017-05-07 ENCOUNTER — Telehealth: Payer: Self-pay

## 2017-05-07 NOTE — Telephone Encounter (Signed)
Pt called extremely upset. She would not give me her name at first. She was calling about a bill she had received. She states that we sent her a bill. I then explained to her that we do not do any billing here and she states that the number on the bill was not cone. That she didn't know who it was but it was from [place] charging her for filling out a form. At that time she gave me her name and I told her that we do not charge to fill out forms. She kept yelling that I needed to "educate" her. I again gave her our billing number but did explain to her that if the bill was to fill out a form that its more than likely not from [place]. Pt continued to say that she needed to be "educated" and states that she had already spoke to me about this but I did explain to her that neither Darius Bump or I have spoken to her.

## 2017-05-10 ENCOUNTER — Encounter: Payer: Self-pay | Admitting: Podiatry

## 2017-05-10 ENCOUNTER — Ambulatory Visit: Payer: BLUE CROSS/BLUE SHIELD | Admitting: Podiatry

## 2017-05-10 DIAGNOSIS — M722 Plantar fascial fibromatosis: Secondary | ICD-10-CM | POA: Diagnosis not present

## 2017-05-10 MED ORDER — TRIAMCINOLONE ACETONIDE 10 MG/ML IJ SUSP
10.0000 mg | Freq: Once | INTRAMUSCULAR | Status: AC
  Administered 2017-05-10: 10 mg

## 2017-05-12 NOTE — Progress Notes (Signed)
Subjective:   Patient ID: Joyce Herrera, female   DOB: 57 y.o.   MRN: [id number]   HPI Patient presents with pain in the heels of both feet and states that she needs injections which are helpful for her   ROS      Objective:  Physical Exam  Neurovascular status intact with exquisite discomfort plantar aspect heel region bilateral     Assessment:  Acute plantar fasciitis bilateral     Plan:  H&P condition reviewed and consideration for surgery at one point reviewed.  Today I injected the plantar fascial bilateral 3 mg Kenalog 5 mg Xylocaine applied sterile dressing and tolerated this well

## 2017-05-21 ENCOUNTER — Encounter (INDEPENDENT_AMBULATORY_CARE_PROVIDER_SITE_OTHER): Payer: Self-pay | Admitting: *Deleted

## 2017-05-21 ENCOUNTER — Other Ambulatory Visit: Payer: Self-pay | Admitting: Allergy & Immunology

## 2017-05-21 ENCOUNTER — Other Ambulatory Visit: Payer: Self-pay | Admitting: "Endocrinology

## 2017-05-21 ENCOUNTER — Encounter (INDEPENDENT_AMBULATORY_CARE_PROVIDER_SITE_OTHER): Payer: Self-pay | Admitting: Internal Medicine

## 2017-05-21 ENCOUNTER — Ambulatory Visit (INDEPENDENT_AMBULATORY_CARE_PROVIDER_SITE_OTHER): Payer: BLUE CROSS/BLUE SHIELD | Admitting: Internal Medicine

## 2017-05-21 VITALS — BP 130/80 | HR 72 | Temp 98.4°F | Ht 65.0 in | Wt 183.8 lb

## 2017-05-21 DIAGNOSIS — R1319 Other dysphagia: Secondary | ICD-10-CM

## 2017-05-21 DIAGNOSIS — R1310 Dysphagia, unspecified: Secondary | ICD-10-CM

## 2017-05-21 NOTE — Telephone Encounter (Signed)
That is fine. She was not on the inhaler since they did not seem to help but we have started her on Symbicort in the past. Go ahead and send it in.   Salvatore Marvel, MD [facility] [place]

## 2017-05-21 NOTE — Progress Notes (Signed)
Subjective:    Patient ID: Joyce Herrera, female    DOB: 01/04/1961, 57 y.o.   MRN: [id number]  HPI Referred by Dr. Ernst Bowler for dysphagia. She says she was told she has asthma and allergies and a cough. She says the cough will not resolved.  She was seen by Dr. Ernst Bowler and was told she possibly had eosinophilic esophagitis. She tells me she is having dysphagia. Sometimes she has trouble swallowing liquids. She has had a cough for years. She underwent a DG Esophagus in August of 2017 which revealed: normal.  She says she eats her food in small pieces and chews well.  She says about 25 yrs a go she underwent an EGD by Dr. Laural Golden. Her appetite is good. No weight loss. Has a BM every 3-4 days.   Review of Systems   Past Medical History:  Diagnosis Date  . Headache(784.0)   . History of colon polyps   . Hypothyroidism   . PONV (postoperative nausea and vomiting)   . Seasonal allergies     Past Surgical History:  Procedure Laterality Date  . ABDOMINAL HYSTERECTOMY    . CHOLECYSTECTOMY    . COLONOSCOPY  09/14/2010   Procedure: COLONOSCOPY;  Surgeon: Rogene Houston, MD;  [facility]: [facility];  Service: Endoscopy;  Laterality: N/A;  . COLONOSCOPY N/A 01/11/2016   Procedure: COLONOSCOPY;  Surgeon: Rogene Houston, MD;  Location: [facility];  Service: Endoscopy;  Laterality: N/A;  930  . MEDIAL PARTIAL KNEE REPLACEMENT    . NASAL SEPTUM SURGERY    . PLANTAR FASCIECTOMY     bilateral  . POLYPECTOMY  01/11/2016   Procedure: POLYPECTOMY;  Surgeon: Rogene Houston, MD;  Location: [facility];  Service: Endoscopy;;  multiple colon polyps   . TONSILLECTOMY      Allergies  Allergen Reactions  . Cephalexin Other (See Comments)    Pt stated, "Ears started to itch; hands and feet were itching"  . Penicillins Rash    Current Outpatient Medications on File Prior to Visit  Medication Sig Dispense Refill  . ALPRAZolam (XANAX) 0.5 MG tablet Take 0.5 mg by mouth as needed.     Marland Kitchen  azelastine (ASTELIN) 0.1 % nasal spray Place 2 sprays into both nostrils 2 (two) times daily as needed for allergies. 30 mL 5  . buPROPion (WELLBUTRIN) 75 MG tablet Take 75 mg by mouth 3 (three) times daily.    . calcium carbonate (OS-CAL) 600 MG TABS Take 600 mg by mouth 2 (two) times daily with a meal.      . esomeprazole (NEXIUM) 40 MG capsule Take 1 capsule (40 mg total) by mouth daily before breakfast. 30 capsule 5  . flurbiprofen (ANSAID) 100 MG tablet   4  . Fluticasone Propionate (XHANCE) 93 MCG/ACT EXHU Place 2 puffs into the nose 2 (two) times daily. 32 mL 5  . furosemide (LASIX) 20 MG tablet Take 20 mg by mouth as needed.   10  . Grass Mix Pollens Allergen Ext (ORALAIR) 300 IR SUBL PLACE 1 TABLET UNDER TONGUE AND ALLOW TO DISSOLVE ONCE EVERY DAY 30 tablet 5  . LYRICA 100 MG capsule   0  . meloxicam (MOBIC) 15 MG tablet Take 15 mg by mouth daily.   2  . propranolol (INDERAL) 40 MG tablet TAKE (1/2) TABLET BY MOUTH AT BEDTIME. 30 tablet 2  . zolpidem (AMBIEN) 10 MG tablet Take 10 mg by mouth daily.   1   Current Facility-Administered  Medications on File Prior to Visit  Medication Dose Route Frequency Provider Last Rate Last Dose  . triamcinolone acetonide (KENALOG) 10 MG/ML injection 10 mg  10 mg Other Once Wallene Huh, DPM            Objective:   Physical Exam Blood pressure 130/80, pulse 72, temperature 98.4 F (36.9 C), height 5\' 5"  (1.651 m), weight 183 lb 12.8 oz (83.4 kg), last menstrual period 01/30/1992. Alert and oriented. Skin warm and dry. Oral mucosa is moist.   . Sclera anicteric, conjunctivae is pink. Thyroid not enlarged. No cervical lymphadenopathy. Lungs clear. Heart regular rate and rhythm.  Abdomen is soft. Bowel sounds are positive. No hepatomegaly. No abdominal masses felt. No tenderness.  No edema to lower extremities.           Assessment & Plan:  Dysphagia. EGD/ED. The risks of bleeding, perforation and infection were reviewed with patient.

## 2017-05-21 NOTE — Telephone Encounter (Signed)
Dr Ernst Bowler please advise on refill. Patient came in last month but was not on any inhalers. Please advise.

## 2017-05-21 NOTE — Patient Instructions (Signed)
The risks of bleeding, perforation and infection were reviewed with patient.  

## 2017-06-12 ENCOUNTER — Telehealth (INDEPENDENT_AMBULATORY_CARE_PROVIDER_SITE_OTHER): Payer: Self-pay | Admitting: *Deleted

## 2017-06-12 NOTE — Telephone Encounter (Signed)
noted 

## 2017-06-12 NOTE — Telephone Encounter (Signed)
FYI: patient is sch'd for EGD/ED 07/10/17 -- she called in and requested procedure be resch'd to sometime in September since she hasn't meet her deductible yet -- EGD/ED moved to 10/23/17 -- I also sch'd her for f/u OV for 10/02/17 to make sure nothing has changed

## 2017-06-21 DIAGNOSIS — Z6830 Body mass index (BMI) 30.0-30.9, adult: Secondary | ICD-10-CM | POA: Diagnosis not present

## 2017-06-21 DIAGNOSIS — I10 Essential (primary) hypertension: Secondary | ICD-10-CM | POA: Diagnosis not present

## 2017-06-21 DIAGNOSIS — D485 Neoplasm of uncertain behavior of skin: Secondary | ICD-10-CM | POA: Diagnosis not present

## 2017-06-21 DIAGNOSIS — E6609 Other obesity due to excess calories: Secondary | ICD-10-CM | POA: Diagnosis not present

## 2017-07-02 ENCOUNTER — Other Ambulatory Visit: Payer: Self-pay | Admitting: Allergy & Immunology

## 2017-07-04 DIAGNOSIS — E782 Mixed hyperlipidemia: Secondary | ICD-10-CM | POA: Diagnosis not present

## 2017-07-04 DIAGNOSIS — E7849 Other hyperlipidemia: Secondary | ICD-10-CM | POA: Diagnosis not present

## 2017-08-20 DIAGNOSIS — H1045 Other chronic allergic conjunctivitis: Secondary | ICD-10-CM | POA: Diagnosis not present

## 2017-08-20 DIAGNOSIS — H52222 Regular astigmatism, left eye: Secondary | ICD-10-CM | POA: Diagnosis not present

## 2017-08-20 DIAGNOSIS — H35371 Puckering of macula, right eye: Secondary | ICD-10-CM | POA: Diagnosis not present

## 2017-08-20 DIAGNOSIS — H5203 Hypermetropia, bilateral: Secondary | ICD-10-CM | POA: Diagnosis not present

## 2017-09-05 DIAGNOSIS — E6609 Other obesity due to excess calories: Secondary | ICD-10-CM | POA: Diagnosis not present

## 2017-09-05 DIAGNOSIS — M160 Bilateral primary osteoarthritis of hip: Secondary | ICD-10-CM | POA: Diagnosis not present

## 2017-09-05 DIAGNOSIS — Z6831 Body mass index (BMI) 31.0-31.9, adult: Secondary | ICD-10-CM | POA: Diagnosis not present

## 2017-09-16 ENCOUNTER — Other Ambulatory Visit: Payer: Self-pay | Admitting: Allergy & Immunology

## 2017-09-26 DIAGNOSIS — Z6830 Body mass index (BMI) 30.0-30.9, adult: Secondary | ICD-10-CM | POA: Diagnosis not present

## 2017-09-26 DIAGNOSIS — M06251 Rheumatoid bursitis, right hip: Secondary | ICD-10-CM | POA: Diagnosis not present

## 2017-09-26 DIAGNOSIS — E6609 Other obesity due to excess calories: Secondary | ICD-10-CM | POA: Diagnosis not present

## 2017-10-02 ENCOUNTER — Ambulatory Visit (INDEPENDENT_AMBULATORY_CARE_PROVIDER_SITE_OTHER): Payer: BLUE CROSS/BLUE SHIELD | Admitting: Internal Medicine

## 2017-10-02 ENCOUNTER — Encounter (INDEPENDENT_AMBULATORY_CARE_PROVIDER_SITE_OTHER): Payer: Self-pay | Admitting: *Deleted

## 2017-10-02 ENCOUNTER — Encounter (INDEPENDENT_AMBULATORY_CARE_PROVIDER_SITE_OTHER): Payer: Self-pay | Admitting: Internal Medicine

## 2017-10-02 VITALS — BP 150/84 | HR 64 | Temp 98.4°F | Ht 65.0 in | Wt 180.6 lb

## 2017-10-02 DIAGNOSIS — R1310 Dysphagia, unspecified: Secondary | ICD-10-CM

## 2017-10-02 DIAGNOSIS — R1319 Other dysphagia: Secondary | ICD-10-CM

## 2017-10-02 NOTE — Patient Instructions (Signed)
The risks of bleeding, perforation and infection were reviewed with patient.  

## 2017-10-02 NOTE — Progress Notes (Signed)
Subjective:    Patient ID: Joyce Herrera, female    DOB: [birth year], 57 y.o.   MRN: [id number] Referred  By Dr. Ernst Bowler for dysphagia in April.  HPI Here today for f/u. Last seen in April of this with c/o dysphagia. She states today she continues to have dysphagia. Foods feel like they are slow to go down. She cannot eat so much at a time. She has had symptoms for 4-5 years.  She underwent DG esophagus in 2017 which was normal.  Her appetite is good. She has lost about 3 pounds since her last visit.  She has a BM x 1 every 3-4 day with Miralax.   Review of Systems Past Medical History:  Diagnosis Date  . Headache(784.0)   . History of colon polyps   . Hypothyroidism   . PONV (postoperative nausea and vomiting)   . Seasonal allergies     Past Surgical History:  Procedure Laterality Date  . ABDOMINAL HYSTERECTOMY    . CHOLECYSTECTOMY    . COLONOSCOPY  09/14/2010   Procedure: COLONOSCOPY;  Surgeon: Rogene Houston, MD;  [facility]: [facility];  Service: Endoscopy;  Laterality: N/A;  . COLONOSCOPY N/A 01/11/2016   Procedure: COLONOSCOPY;  Surgeon: Rogene Houston, MD;  [facility];  Service: Endoscopy;  Laterality: N/A;  930  . MEDIAL PARTIAL KNEE REPLACEMENT    . NASAL SEPTUM SURGERY    . PLANTAR FASCIECTOMY     bilateral  . POLYPECTOMY  01/11/2016   Procedure: POLYPECTOMY;  Surgeon: Rogene Houston, MD;  [facility]: [facility];  Service: Endoscopy;;  multiple colon polyps   . TONSILLECTOMY      Allergies  Allergen Reactions  . Cephalexin Other (See Comments)    Pt stated, "Ears started to itch; hands and feet were itching"  . Penicillins Rash    Current Outpatient Medications on File Prior to Visit  Medication Sig Dispense Refill  . ALPRAZolam (XANAX) 0.5 MG tablet Take 0.5 mg by mouth as needed.     Marland Kitchen azelastine (ASTELIN) 0.1 % nasal spray Place 2 sprays into both nostrils 2 (two) times daily as needed for allergies. 30 mL 5  . buPROPion (WELLBUTRIN)  75 MG tablet Take 75 mg by mouth 3 (three) times daily.    . calcium carbonate (OS-CAL) 600 MG TABS Take 600 mg by mouth 2 (two) times daily with a meal.      . esomeprazole (NEXIUM) 40 MG capsule Take 1 capsule (40 mg total) by mouth daily before breakfast. 30 capsule 5  . flurbiprofen (ANSAID) 100 MG tablet   4  . fluticasone (FLONASE) 50 MCG/ACT nasal spray USE 2 SPRAYS IN EACH NOSTRIL TWICE DAILY AS NEEDED FOR ALLERGIES OR RHINITIS. 16 g 2  . Fluticasone Propionate (XHANCE) 93 MCG/ACT EXHU Place 2 puffs into the nose 2 (two) times daily. 32 mL 5  . furosemide (LASIX) 20 MG tablet Take 20 mg by mouth as needed.   10  . Grass Mix Pollens Allergen Ext (ORALAIR) 300 IR SUBL PLACE 1 TABLET UNDER TONGUE AND ALLOW TO DISSOLVE ONCE EVERY DAY 30 tablet 5  . LYRICA 100 MG capsule   0  . meloxicam (MOBIC) 15 MG tablet Take 15 mg by mouth daily.   2  . propranolol (INDERAL) 40 MG tablet TAKE (1/2) TABLET BY MOUTH AT BEDTIME. 30 tablet 2  . SYMBICORT 160-4.5 MCG/ACT inhaler INHALE 2 PUFFS INTO LUNGS TWICE DAILY. 10.2 g 0  . zolpidem (AMBIEN)  10 MG tablet Take 10 mg by mouth daily.   1   Current Facility-Administered Medications on File Prior to Visit  Medication Dose Route Frequency Provider Last Rate Last Dose  . triamcinolone acetonide (KENALOG) 10 MG/ML injection 10 mg  10 mg Other Once Wallene Huh, DPM            Objective:   Physical Exam Blood pressure (!) 150/84, pulse 64, temperature 98.4 F (36.9 C), height 5\' 5"  (1.651 m), weight 180 lb 9.6 oz (81.9 kg), last menstrual period 01/30/1992. Alert and oriented. Skin warm and dry. Oral mucosa is moist.   . Sclera anicteric, conjunctivae is pink. Thyroid not enlarged. No cervical lymphadenopathy. Lungs clear. Heart regular rate and rhythm.  Abdomen is soft. Bowel sounds are positive. No hepatomegaly. No abdominal masses felt. No tenderness.  No edema to lower extremities. Patient is alert and oriented.         Assessment & Plan:    Dysphagia. Esophageal stricture needs to be ruled out. EGD/ED.

## 2017-10-10 DIAGNOSIS — E079 Disorder of thyroid, unspecified: Secondary | ICD-10-CM | POA: Diagnosis not present

## 2017-10-10 DIAGNOSIS — E6609 Other obesity due to excess calories: Secondary | ICD-10-CM | POA: Diagnosis not present

## 2017-10-10 DIAGNOSIS — J069 Acute upper respiratory infection, unspecified: Secondary | ICD-10-CM | POA: Diagnosis not present

## 2017-10-10 DIAGNOSIS — Z6830 Body mass index (BMI) 30.0-30.9, adult: Secondary | ICD-10-CM | POA: Diagnosis not present

## 2017-10-10 DIAGNOSIS — R61 Generalized hyperhidrosis: Secondary | ICD-10-CM | POA: Diagnosis not present

## 2017-10-17 ENCOUNTER — Other Ambulatory Visit (HOSPITAL_COMMUNITY): Payer: Self-pay | Admitting: Family Medicine

## 2017-10-17 DIAGNOSIS — Z1231 Encounter for screening mammogram for malignant neoplasm of breast: Secondary | ICD-10-CM

## 2017-10-23 ENCOUNTER — Encounter (HOSPITAL_COMMUNITY): Payer: Self-pay | Admitting: *Deleted

## 2017-10-23 ENCOUNTER — Encounter (HOSPITAL_COMMUNITY)
Admission: RE | Disposition: A | Discharge status: Home / Self Care | Payer: Self-pay | Source: Ambulatory Visit | Attending: Internal Medicine

## 2017-10-23 ENCOUNTER — Other Ambulatory Visit: Payer: Self-pay

## 2017-10-23 ENCOUNTER — Ambulatory Visit (HOSPITAL_COMMUNITY)
Admission: RE | Admit: 2017-10-23 | Discharge: 2017-10-23 | Disposition: A | Discharge status: Home / Self Care | Payer: BLUE CROSS/BLUE SHIELD | Source: Ambulatory Visit | Attending: Internal Medicine | Admitting: Internal Medicine

## 2017-10-23 DIAGNOSIS — K2970 Gastritis, unspecified, without bleeding: Secondary | ICD-10-CM | POA: Insufficient documentation

## 2017-10-23 DIAGNOSIS — Z96652 Presence of left artificial knee joint: Secondary | ICD-10-CM | POA: Insufficient documentation

## 2017-10-23 DIAGNOSIS — K317 Polyp of stomach and duodenum: Secondary | ICD-10-CM | POA: Diagnosis not present

## 2017-10-23 DIAGNOSIS — Z8249 Family history of ischemic heart disease and other diseases of the circulatory system: Secondary | ICD-10-CM | POA: Insufficient documentation

## 2017-10-23 DIAGNOSIS — Z90710 Acquired absence of both cervix and uterus: Secondary | ICD-10-CM | POA: Insufficient documentation

## 2017-10-23 DIAGNOSIS — K219 Gastro-esophageal reflux disease without esophagitis: Secondary | ICD-10-CM | POA: Insufficient documentation

## 2017-10-23 DIAGNOSIS — E039 Hypothyroidism, unspecified: Secondary | ICD-10-CM | POA: Insufficient documentation

## 2017-10-23 DIAGNOSIS — Z800 Family history of malignant neoplasm of digestive organs: Secondary | ICD-10-CM | POA: Insufficient documentation

## 2017-10-23 DIAGNOSIS — Z7951 Long term (current) use of inhaled steroids: Secondary | ICD-10-CM | POA: Insufficient documentation

## 2017-10-23 DIAGNOSIS — Z8051 Family history of malignant neoplasm of kidney: Secondary | ICD-10-CM | POA: Insufficient documentation

## 2017-10-23 DIAGNOSIS — R1319 Other dysphagia: Secondary | ICD-10-CM

## 2017-10-23 DIAGNOSIS — Z881 Allergy status to other antibiotic agents status: Secondary | ICD-10-CM | POA: Diagnosis not present

## 2017-10-23 DIAGNOSIS — Z9049 Acquired absence of other specified parts of digestive tract: Secondary | ICD-10-CM | POA: Insufficient documentation

## 2017-10-23 DIAGNOSIS — Z87891 Personal history of nicotine dependence: Secondary | ICD-10-CM | POA: Insufficient documentation

## 2017-10-23 DIAGNOSIS — Z8042 Family history of malignant neoplasm of prostate: Secondary | ICD-10-CM | POA: Diagnosis not present

## 2017-10-23 DIAGNOSIS — J45909 Unspecified asthma, uncomplicated: Secondary | ICD-10-CM | POA: Diagnosis not present

## 2017-10-23 DIAGNOSIS — Z801 Family history of malignant neoplasm of trachea, bronchus and lung: Secondary | ICD-10-CM | POA: Diagnosis not present

## 2017-10-23 DIAGNOSIS — Z880 Allergy status to penicillin: Secondary | ICD-10-CM | POA: Insufficient documentation

## 2017-10-23 DIAGNOSIS — R1314 Dysphagia, pharyngoesophageal phase: Secondary | ICD-10-CM | POA: Diagnosis not present

## 2017-10-23 DIAGNOSIS — Z86010 Personal history of colonic polyps: Secondary | ICD-10-CM | POA: Diagnosis not present

## 2017-10-23 DIAGNOSIS — Z79899 Other long term (current) drug therapy: Secondary | ICD-10-CM | POA: Diagnosis not present

## 2017-10-23 DIAGNOSIS — K2950 Unspecified chronic gastritis without bleeding: Secondary | ICD-10-CM | POA: Diagnosis not present

## 2017-10-23 DIAGNOSIS — R1310 Dysphagia, unspecified: Secondary | ICD-10-CM | POA: Diagnosis not present

## 2017-10-23 DIAGNOSIS — Z833 Family history of diabetes mellitus: Secondary | ICD-10-CM | POA: Diagnosis not present

## 2017-10-23 HISTORY — DX: Unspecified asthma, uncomplicated: J45.909

## 2017-10-23 HISTORY — PX: ESOPHAGOGASTRODUODENOSCOPY: SHX5428

## 2017-10-23 HISTORY — PX: ESOPHAGEAL DILATION: SHX303

## 2017-10-23 HISTORY — PX: BIOPSY: SHX5522

## 2017-10-23 SURGERY — EGD (ESOPHAGOGASTRODUODENOSCOPY)
Anesthesia: Moderate Sedation

## 2017-10-23 MED ORDER — PROMETHAZINE HCL 25 MG/ML IJ SOLN
INTRAMUSCULAR | Status: AC
  Filled 2017-10-23: qty 1

## 2017-10-23 MED ORDER — SODIUM CHLORIDE 0.9 % IV SOLN
INTRAVENOUS | Status: DC
  Administered 2017-10-23: 1000 mL via INTRAVENOUS

## 2017-10-23 MED ORDER — SODIUM CHLORIDE 0.9% FLUSH
INTRAVENOUS | Status: AC
  Filled 2017-10-23: qty 10

## 2017-10-23 MED ORDER — LIDOCAINE VISCOUS HCL 2 % MT SOLN
OROMUCOSAL | Status: DC | PRN
  Administered 2017-10-23: 4 mL via OROMUCOSAL

## 2017-10-23 MED ORDER — LIDOCAINE VISCOUS HCL 2 % MT SOLN
OROMUCOSAL | Status: AC
  Filled 2017-10-23: qty 15

## 2017-10-23 MED ORDER — MEPERIDINE HCL 50 MG/ML IJ SOLN
INTRAMUSCULAR | Status: AC
  Filled 2017-10-23: qty 1

## 2017-10-23 MED ORDER — MIDAZOLAM HCL 5 MG/5ML IJ SOLN
INTRAMUSCULAR | Status: AC
  Filled 2017-10-23: qty 10

## 2017-10-23 MED ORDER — PROMETHAZINE HCL 25 MG/ML IJ SOLN
INTRAMUSCULAR | Status: DC | PRN
  Administered 2017-10-23: 6.25 mg via INTRAVENOUS

## 2017-10-23 MED ORDER — STERILE WATER FOR IRRIGATION IR SOLN
Status: DC | PRN
  Administered 2017-10-23: 09:00:00

## 2017-10-23 MED ORDER — MEPERIDINE HCL 50 MG/ML IJ SOLN
INTRAMUSCULAR | Status: DC | PRN
  Administered 2017-10-23 (×2): 25 mg via INTRAVENOUS

## 2017-10-23 MED ORDER — MIDAZOLAM HCL 5 MG/5ML IJ SOLN
INTRAMUSCULAR | Status: DC | PRN
  Administered 2017-10-23 (×3): 2 mg via INTRAVENOUS

## 2017-10-23 NOTE — H&P (Signed)
Joyce Herrera is an 57 y.o. female.   Chief Complaint: Patient is here for EGD and ED. HPI: Patient is 57 year old Caucasian female who has several year history of GERD who is maintained on dietary measures and PPI who presents with over 2-year history of dysphagia to solids liquids and even pills.  She points to the suprasternal area sort of bolus obstruction.  She had barium study in December 2017 and was normal.  She reports increasing frequency of her symptoms.  She says heartburn is well controlled with therapy.  She may have an episode he had and there if she eats the food that she should not be eating.  She denies nausea vomiting epigastric pain or melena.  Past Medical History:  Diagnosis Date  . Asthma   . Headache(784.0)   . History of colon polyps   . Hypothyroidism   . PONV (postoperative nausea and vomiting)   . Seasonal allergies         GERD  Past Surgical History:  Procedure Laterality Date  . ABDOMINAL HYSTERECTOMY    . CHOLECYSTECTOMY    . COLONOSCOPY  09/14/2010   Procedure: COLONOSCOPY;  Surgeon: Rogene Houston, MD;  [facility]: [facility];  Service: Endoscopy;  Laterality: N/A;  . COLONOSCOPY N/A 01/11/2016   Procedure: COLONOSCOPY;  Surgeon: Rogene Houston, MD;  [facility];  Service: Endoscopy;  Laterality: N/A;  930  . MEDIAL PARTIAL KNEE REPLACEMENT Left   . NASAL SEPTUM SURGERY    . PLANTAR FASCIECTOMY     bilateral  . POLYPECTOMY  01/11/2016   Procedure: POLYPECTOMY;  Surgeon: Rogene Houston, MD;  [facility]: [facility];  Service: Endoscopy;;  multiple colon polyps   . TONSILLECTOMY      Family History  Problem Relation Age of Onset  . Lung cancer Mother   . Kidney cancer Mother   . Colon cancer Father   . Prostate cancer Father   . Diabetes Father   . Hypertension Father   . Kidney failure Father    Social History:  reports that she has quit smoking. She has never used smokeless tobacco. She reports that she does not drink  alcohol or use drugs.  Allergies:  Allergies  Allergen Reactions  . Cephalexin Other (See Comments)    Pt stated, "Ears started to itch; hands and feet were itching"  . Penicillins Rash    Has patient had a PCN reaction causing immediate rash, facial/tongue/throat swelling, SOB or lightheadedness with hypotension: No Has patient had a PCN reaction causing severe rash involving mucus membranes or skin necrosis: No Has patient had a PCN reaction that required hospitalization: No Has patient had a PCN reaction occurring within the last 10 years: No If all of the above answers are "NO", then may proceed with Cephalosporin use.     Medications Prior to Admission  Medication Sig Dispense Refill  . ALPRAZolam (XANAX) 0.5 MG tablet Take 0.25 mg by mouth daily as needed for anxiety.     Marland Kitchen azelastine (ASTELIN) 0.1 % nasal spray Place 2 sprays into both nostrils 2 (two) times daily as needed for allergies. 30 mL 5  . calcium-vitamin D (OSCAL WITH D) 500-200 MG-UNIT tablet Take 1 tablet by mouth daily with breakfast.    . DUEXIS 800-26.6 MG TABS Take 1 tablet by mouth 3 (three) times daily as needed for pain.  3  . esomeprazole (NEXIUM) 40 MG capsule Take 1 capsule (40 mg total) by mouth daily before  breakfast. 30 capsule 5  . flurbiprofen (ANSAID) 100 MG tablet Take 100 mg by mouth daily as needed (headache).   4  . fluticasone (FLONASE) 50 MCG/ACT nasal spray USE 2 SPRAYS IN EACH NOSTRIL TWICE DAILY AS NEEDED FOR ALLERGIES OR RHINITIS. (Patient taking differently: Place 2 sprays into both nostrils daily as needed. ) 16 g 2  . Grass Mix Pollens Allergen Ext (ORALAIR) 300 IR SUBL PLACE 1 TABLET UNDER TONGUE AND ALLOW TO DISSOLVE ONCE EVERY DAY 30 tablet 5  . meloxicam (MOBIC) 15 MG tablet Take 15 mg by mouth daily.   2  . Multiple Vitamin (MULTIVITAMIN WITH MINERALS) TABS tablet Take 1 tablet by mouth daily.    Marland Kitchen OVER THE COUNTER MEDICATION Take 1 capsule by mouth 2 (two) times daily. Hyaluronic  acid, Collagen, and MSM    . pregabalin (LYRICA) 300 MG capsule Take 300 mg by mouth daily.    . propranolol (INDERAL) 20 MG tablet Take 20 mg by mouth at bedtime.    . SYMBICORT 160-4.5 MCG/ACT inhaler INHALE 2 PUFFS INTO LUNGS TWICE DAILY. (Patient taking differently: Inhale 1-2 puffs into the lungs daily as needed (shortness of breath). ) 10.2 g 0  . zolpidem (AMBIEN) 10 MG tablet Take 5 mg by mouth at bedtime.   1  . albuterol (PROVENTIL HFA;VENTOLIN HFA) 108 (90 Base) MCG/ACT inhaler Inhale 1-2 puffs into the lungs every 6 (six) hours as needed for wheezing or shortness of breath.    . Fluticasone Propionate (XHANCE) 93 MCG/ACT EXHU Place 2 puffs into the nose 2 (two) times daily. (Patient not taking: Reported on 10/18/2017) 32 mL 5  . furosemide (LASIX) 20 MG tablet Take 20 mg by mouth daily as needed for fluid.   10    No results found for this or any previous visit (from the past 48 hour(s)). No results found.  ROS  Blood pressure 118/83, pulse 85, temperature 98.2 F (36.8 C), temperature source Oral, resp. rate 20, height 5\' 5"  (1.651 m), weight 81.6 kg, last menstrual period 01/30/1992, SpO2 97 %. Physical Exam  Constitutional: She appears well-developed and well-nourished.  HENT:  Mouth/Throat: Oropharynx is clear and moist.  Eyes: Conjunctivae are normal. No scleral icterus.  Neck: No thyromegaly present.  Cardiovascular: Normal rate, regular rhythm and normal heart sounds.  No murmur heard. Respiratory: Effort normal and breath sounds normal.  GI: Soft. She exhibits no distension and no mass. There is no tenderness.  Musculoskeletal: She exhibits no edema.  Lymphadenopathy:    She has no cervical adenopathy.  Neurological: She is alert.  Skin: Skin is warm and dry.     Assessment/Plan Esophageal dysphagia and patient with chronic GERD. EGD with ED.  Hildred Laser, MD 10/23/2017, 8:38 AM

## 2017-10-23 NOTE — Discharge Instructions (Signed)
No aspirin or NSAIDs for 24 hours. Resume other medications as before. Resume usual diet. No driving for 24 hours. Physician will call with biopsy results.  Gastrointestinal Endoscopy, Care After Refer to this sheet in the next few weeks. These instructions provide you with information about caring for yourself after your procedure. Your health care provider may also give you more specific instructions. Your treatment has been planned according to current medical practices, but problems sometimes occur. Call your health care provider if you have any problems or questions after your procedure. What can I expect after the procedure? After your procedure, it is common to feel:  Bloated.  Soreness in your throat.  Sleepy.  Follow these instructions at home:  Do not drive for 24 hours if you received a if you received a medicine to help you relax (sedative).  Avoid drinking warm beverages and alcohol for the first 24 hours after the procedure.  Take over-the-counter and prescription medicines only as told by your health care provider.  Drink enough fluids to keep your urine clear or pale yellow.  If you feel bloated, try going for a walk. Walking may help the feeling go away.  If your throat is sore, try gargling with salt water. Get help right away if:  You have severe nausea or vomiting.  You have severe abdominal pain, abdominal cramps that last longer than 6 hours, or abdominal swelling.  You have severe shoulder or back pain.  You have trouble swallowing.  You have shortness of breath, your breathing is shallow, or you breathing is faster than normal.  You have a fever.  Your heart is beating very fast.  You vomit blood or material that looks like coffee grounds.  You have bloody, black, or tarry stools. This information is not intended to replace advice given to you by your health care provider. Make sure you discuss any questions you have with your health care  provider.   Gastric Polyps A gastric polyp, also called a stomach polyp, is a growth on the lining of the stomach. Most polyps are not dangerous, but some can be harmful because of their size, location, or type. Polyps that can become harmful include:  Large polyps. These can turn into sores (ulcers). Ulcers can lead to stomach bleeding.  Polyps that block food from moving from the stomach to the small intestine (gastric outlet obstruction).  A type of polyp called an adenoma. This type of polyp can become cancerous.  What are the causes? Gastric polyps form when the lining of the stomach gets inflamed or damaged. Stomach inflammation and damage may be caused by:  A long-lasting stomach condition, such as gastritis.  Certain medicines used to reduce stomach acid.  An inherited condition called familial adenomatous polyposis.  What are the signs or symptoms? Usually, this condition does not cause any symptoms. If you do have symptoms, they may include:  Pain or tenderness in the abdomen.  Nausea.  Trouble eating or swallowing.  Blood in the stool.  Anemia.  How is this diagnosed? Gastric polyps are diagnosed with:  A medical procedure called endoscopy.  A lab test in which a part of the polyp is examined. This test is done with a sample of polyp tissue (biopsy) taken during an endoscopy.  How is this treated? Treatment depends on the type, location, and size of the polyps. Treatment may involve:  Having the polyps checked regularly with an endoscopy.  Having the polyps removed with an endoscopy. This may  be done if the polyps are harmful or can become harmful. Removing a polyp often prevents problems from developing.  Having the polyps removed with a surgery called a partial gastrectomy. This may be done in rare cases to remove very large polyps.  Treating the underlying condition that caused the polyps.  Follow these instructions at home:  Take over-the-counter  and prescription medicines only as told by your health care provider.  Keep all follow-up visits as told by your health care provider. This is important. Contact a health care provider if:  You develop new symptoms.  Your symptoms get worse. Get help right away if:  You vomit blood.  You have severe abdominal pain.  You cannot eat or drink.  You have blood in your stool. This information is not intended to replace advice given to you by your health care provider. Make sure you discuss any questions you have with your health care provider. Document Released: 01/02/2012 Document Revised: 06/06/2015 Document Reviewed: 01/30/2015 Elsevier Interactive Patient Education  2018 [employer].

## 2017-10-23 NOTE — Op Note (Signed)
[facility] Patient Name: Joyce Herrera Procedure Date: 10/23/2017 8:15 AM MRN: [id number] Date of Birth: 11/16/1960 Attending MD: Hildred Laser , MD CSN: [id number] Age: 57 Admit Type: Outpatient Procedure:                Upper GI endoscopy Indications:              Esophageal dysphagia Providers:                Hildred Laser, MD, Jeanann Lewandowsky. Sharon Seller, RN, Randa Spike, Technician Referring MD:             Redmond School, MD Medicines:                Lidocaine spray, Meperidine 50 mg IV, Midazolam 6                            mg IV, Promethazine 0.62 mg IV Complications:            No immediate complications. Estimated Blood Loss:     Estimated blood loss was minimal. Procedure:                Pre-Anesthesia Assessment:                           - Prior to the procedure, a History and Physical                            was performed, and patient medications and                            allergies were reviewed. The patient's tolerance of                            previous anesthesia was also reviewed. The risks                            and benefits of the procedure and the sedation                            options and risks were discussed with the patient.                            All questions were answered, and informed consent                            was obtained. Prior Anticoagulants: The patient                            last took ibuprofen 7 days prior to the procedure.                            ASA Grade Assessment: II - A patient with mild  systemic disease. After reviewing the risks and                            benefits, the patient was deemed in satisfactory                            condition to undergo the procedure.                           After obtaining informed consent, the endoscope was                            passed under direct vision. Throughout the                             procedure, the patient's blood pressure, pulse, and                            oxygen saturations were monitored continuously. The                            GIF-H190 ([id number]) scope was introduced through the                            mouth, and advanced to the second part of duodenum.                            The upper GI endoscopy was accomplished without                            difficulty. The patient tolerated the procedure                            well. Scope In: 8:47:40 AM Scope Out: 8:59:53 AM Total Procedure Duration: 0 hours 12 minutes 13 seconds  Findings:      The examined esophagus was normal.      The Z-line was irregular and was found 36 cm from the incisors.      No endoscopic abnormality was evident in the esophagus to explain the       patient's complaint of dysphagia. It was decided, however, to proceed       with dilation of the entire esophagus. The dilation site was examined       following endoscope reinsertion and showed no change and no bleeding,       mucosal tear or perforation.      Patchy mild inflammation characterized by congestion (edema) and       erythema was found in the gastric antrum. Biopsies were taken with a       cold forceps for histology.      The exam of the stomach was otherwise normal.      The duodenal bulb and second portion of the duodenum were normal. Impression:               - Normal esophagus.                           -  Z-line irregular, 36 cm from the incisors.                           - No endoscopic esophageal abnormality to explain                            patient's dysphagia. Esophagus dilated.                           - Gastritis. Biopsied.                           - Normal duodenal bulb and second portion of the                            duodenum. Moderate Sedation:      Moderate (conscious) sedation was administered by the endoscopy nurse       and supervised by the endoscopist. The following parameters were        monitored: oxygen saturation, heart rate, blood pressure, CO2       capnography and response to care. Total physician intraservice time was       16 minutes. Recommendation:           - Patient has a contact number available for                            emergencies. The signs and symptoms of potential                            delayed complications were discussed with the                            patient. Return to normal activities tomorrow.                            Written discharge instructions were provided to the                            patient.                           - Resume previous diet today.                           - No aspirin, ibuprofen, naproxen, or other                            non-steroidal anti-inflammatory drugs for 1 day.                           - Await pathology results.                           - Telephone GI clinic in 1 week. Procedure Code(s):        --- Professional ---  67209, Esophagogastroduodenoscopy, flexible,                            transoral; with biopsy, single or multiple                           G0500, Moderate sedation services provided by the                            same physician or other qualified health care                            professional performing a gastrointestinal                            endoscopic service that sedation supports,                            requiring the presence of an independent trained                            observer to assist in the monitoring of the                            patient's level of consciousness and physiological                            status; initial 15 minutes of intra-service time;                            patient age 42 years or older (additional time may                            be reported with [phone], as appropriate) Diagnosis Code(s):        --- Professional ---                           K22.8, Other specified diseases of  esophagus                           K29.70, Gastritis, unspecified, without bleeding                           R13.14, Dysphagia, pharyngoesophageal phase CPT copyright 2017 American Medical Association. All rights reserved. The codes documented in this report are preliminary and upon coder review may  be revised to meet current compliance requirements. Hildred Laser, MD Hildred Laser, MD 10/23/2017 2:25:45 PM This report has been signed electronically. Number of Addenda: 0

## 2017-10-30 DIAGNOSIS — R748 Abnormal levels of other serum enzymes: Secondary | ICD-10-CM | POA: Diagnosis not present

## 2017-11-01 DIAGNOSIS — Z0001 Encounter for general adult medical examination with abnormal findings: Secondary | ICD-10-CM | POA: Diagnosis not present

## 2017-11-01 DIAGNOSIS — M1990 Unspecified osteoarthritis, unspecified site: Secondary | ICD-10-CM | POA: Diagnosis not present

## 2017-11-01 DIAGNOSIS — Z6830 Body mass index (BMI) 30.0-30.9, adult: Secondary | ICD-10-CM | POA: Diagnosis not present

## 2017-11-01 DIAGNOSIS — E782 Mixed hyperlipidemia: Secondary | ICD-10-CM | POA: Diagnosis not present

## 2017-11-01 DIAGNOSIS — D0472 Carcinoma in situ of skin of left lower limb, including hip: Secondary | ICD-10-CM | POA: Diagnosis not present

## 2017-11-01 DIAGNOSIS — D485 Neoplasm of uncertain behavior of skin: Secondary | ICD-10-CM | POA: Diagnosis not present

## 2017-11-01 DIAGNOSIS — K219 Gastro-esophageal reflux disease without esophagitis: Secondary | ICD-10-CM | POA: Diagnosis not present

## 2017-11-01 DIAGNOSIS — E063 Autoimmune thyroiditis: Secondary | ICD-10-CM | POA: Diagnosis not present

## 2017-11-01 DIAGNOSIS — I10 Essential (primary) hypertension: Secondary | ICD-10-CM | POA: Diagnosis not present

## 2017-11-01 DIAGNOSIS — Z23 Encounter for immunization: Secondary | ICD-10-CM | POA: Diagnosis not present

## 2017-11-01 DIAGNOSIS — E6609 Other obesity due to excess calories: Secondary | ICD-10-CM | POA: Diagnosis not present

## 2017-11-04 ENCOUNTER — Encounter (HOSPITAL_COMMUNITY): Payer: Self-pay | Admitting: Internal Medicine

## 2017-11-04 DIAGNOSIS — Z0001 Encounter for general adult medical examination with abnormal findings: Secondary | ICD-10-CM | POA: Diagnosis not present

## 2017-11-04 DIAGNOSIS — Z23 Encounter for immunization: Secondary | ICD-10-CM | POA: Diagnosis not present

## 2017-11-04 DIAGNOSIS — D485 Neoplasm of uncertain behavior of skin: Secondary | ICD-10-CM | POA: Diagnosis not present

## 2017-11-18 ENCOUNTER — Encounter: Payer: Self-pay | Admitting: Podiatry

## 2017-11-18 ENCOUNTER — Ambulatory Visit: Payer: BLUE CROSS/BLUE SHIELD | Admitting: Podiatry

## 2017-11-18 ENCOUNTER — Ambulatory Visit (INDEPENDENT_AMBULATORY_CARE_PROVIDER_SITE_OTHER): Payer: BLUE CROSS/BLUE SHIELD

## 2017-11-18 DIAGNOSIS — M722 Plantar fascial fibromatosis: Secondary | ICD-10-CM | POA: Diagnosis not present

## 2017-11-18 MED ORDER — TRIAMCINOLONE ACETONIDE 10 MG/ML IJ SUSP
10.0000 mg | Freq: Once | INTRAMUSCULAR | Status: AC
  Administered 2017-11-18: 10 mg

## 2017-11-18 NOTE — Patient Instructions (Signed)

## 2017-11-21 DIAGNOSIS — H1045 Other chronic allergic conjunctivitis: Secondary | ICD-10-CM | POA: Diagnosis not present

## 2017-11-21 DIAGNOSIS — H35371 Puckering of macula, right eye: Secondary | ICD-10-CM | POA: Diagnosis not present

## 2017-11-21 NOTE — Progress Notes (Signed)
Subjective:   Patient ID: Joyce Herrera, female   DOB: 57 y.o.   MRN: [id number]   HPI Patient presents with significant discomfort plantar aspect of the heel region bilateral with inflammation and fluid around the medial band with depression of the arch noted   ROS      Objective:  Physical Exam  Neurovascular status intact with exquisite discomfort plantar aspect heel region bilateral with fluid buildup noted around the medial band     Assessment:  Acute plantar fasciitis bilateral with moderate depression of the arch is complicating factor     Plan:  H&P condition reviewed injected the plantar fascial bilateral 3 mg Kenalog 5 mg Xylocaine and went ahead scanned for customized orthotic devices to reduce all plantar pressure on the feet

## 2017-11-22 DIAGNOSIS — M545 Low back pain: Secondary | ICD-10-CM | POA: Diagnosis not present

## 2017-11-22 DIAGNOSIS — M25552 Pain in left hip: Secondary | ICD-10-CM | POA: Diagnosis not present

## 2017-11-22 DIAGNOSIS — M7062 Trochanteric bursitis, left hip: Secondary | ICD-10-CM | POA: Diagnosis not present

## 2017-11-27 DIAGNOSIS — M25552 Pain in left hip: Secondary | ICD-10-CM | POA: Diagnosis not present

## 2017-11-29 ENCOUNTER — Ambulatory Visit (HOSPITAL_COMMUNITY)
Admission: RE | Admit: 2017-11-29 | Discharge: 2017-11-29 | Disposition: A | Discharge status: Home / Self Care | Payer: BLUE CROSS/BLUE SHIELD | Source: Ambulatory Visit | Attending: Family Medicine | Admitting: Family Medicine

## 2017-11-29 DIAGNOSIS — Z1231 Encounter for screening mammogram for malignant neoplasm of breast: Secondary | ICD-10-CM | POA: Diagnosis not present

## 2017-12-11 DIAGNOSIS — M545 Low back pain: Secondary | ICD-10-CM | POA: Diagnosis not present

## 2017-12-11 DIAGNOSIS — M25552 Pain in left hip: Secondary | ICD-10-CM | POA: Diagnosis not present

## 2017-12-12 DIAGNOSIS — L820 Inflamed seborrheic keratosis: Secondary | ICD-10-CM | POA: Diagnosis not present

## 2017-12-16 ENCOUNTER — Ambulatory Visit: Payer: BLUE CROSS/BLUE SHIELD | Admitting: Orthotics

## 2017-12-16 DIAGNOSIS — R531 Weakness: Secondary | ICD-10-CM | POA: Diagnosis not present

## 2017-12-16 DIAGNOSIS — M25652 Stiffness of left hip, not elsewhere classified: Secondary | ICD-10-CM | POA: Diagnosis not present

## 2017-12-16 DIAGNOSIS — R262 Difficulty in walking, not elsewhere classified: Secondary | ICD-10-CM | POA: Diagnosis not present

## 2017-12-16 DIAGNOSIS — M79672 Pain in left foot: Principal | ICD-10-CM

## 2017-12-16 DIAGNOSIS — S76012D Strain of muscle, fascia and tendon of left hip, subsequent encounter: Secondary | ICD-10-CM | POA: Diagnosis not present

## 2017-12-16 DIAGNOSIS — L600 Ingrowing nail: Secondary | ICD-10-CM

## 2017-12-16 DIAGNOSIS — M79671 Pain in right foot: Secondary | ICD-10-CM

## 2017-12-16 NOTE — Progress Notes (Signed)
Patient came in today to pick up custom made foot orthotics.  The goals were accomplished and the patient reported no dissatisfaction with said orthotics.  Patient was advised of breakin period and how to report any issues. 

## 2017-12-20 DIAGNOSIS — S76012D Strain of muscle, fascia and tendon of left hip, subsequent encounter: Secondary | ICD-10-CM | POA: Diagnosis not present

## 2017-12-20 DIAGNOSIS — R262 Difficulty in walking, not elsewhere classified: Secondary | ICD-10-CM | POA: Diagnosis not present

## 2017-12-20 DIAGNOSIS — R531 Weakness: Secondary | ICD-10-CM | POA: Diagnosis not present

## 2017-12-20 DIAGNOSIS — M25652 Stiffness of left hip, not elsewhere classified: Secondary | ICD-10-CM | POA: Diagnosis not present

## 2017-12-23 DIAGNOSIS — S76012D Strain of muscle, fascia and tendon of left hip, subsequent encounter: Secondary | ICD-10-CM | POA: Diagnosis not present

## 2017-12-23 DIAGNOSIS — M25652 Stiffness of left hip, not elsewhere classified: Secondary | ICD-10-CM | POA: Diagnosis not present

## 2017-12-23 DIAGNOSIS — R262 Difficulty in walking, not elsewhere classified: Secondary | ICD-10-CM | POA: Diagnosis not present

## 2017-12-23 DIAGNOSIS — R531 Weakness: Secondary | ICD-10-CM | POA: Diagnosis not present

## 2017-12-25 ENCOUNTER — Other Ambulatory Visit: Payer: Self-pay | Admitting: Allergy & Immunology

## 2017-12-25 DIAGNOSIS — M25652 Stiffness of left hip, not elsewhere classified: Secondary | ICD-10-CM | POA: Diagnosis not present

## 2017-12-25 DIAGNOSIS — R262 Difficulty in walking, not elsewhere classified: Secondary | ICD-10-CM | POA: Diagnosis not present

## 2017-12-25 DIAGNOSIS — S76012D Strain of muscle, fascia and tendon of left hip, subsequent encounter: Secondary | ICD-10-CM | POA: Diagnosis not present

## 2017-12-30 DIAGNOSIS — R531 Weakness: Secondary | ICD-10-CM | POA: Diagnosis not present

## 2017-12-30 DIAGNOSIS — M25652 Stiffness of left hip, not elsewhere classified: Secondary | ICD-10-CM | POA: Diagnosis not present

## 2017-12-30 DIAGNOSIS — R262 Difficulty in walking, not elsewhere classified: Secondary | ICD-10-CM | POA: Diagnosis not present

## 2017-12-30 DIAGNOSIS — S76012D Strain of muscle, fascia and tendon of left hip, subsequent encounter: Secondary | ICD-10-CM | POA: Diagnosis not present

## 2018-01-01 DIAGNOSIS — S76012D Strain of muscle, fascia and tendon of left hip, subsequent encounter: Secondary | ICD-10-CM | POA: Diagnosis not present

## 2018-01-01 DIAGNOSIS — R262 Difficulty in walking, not elsewhere classified: Secondary | ICD-10-CM | POA: Diagnosis not present

## 2018-01-01 DIAGNOSIS — M25652 Stiffness of left hip, not elsewhere classified: Secondary | ICD-10-CM | POA: Diagnosis not present

## 2018-01-02 DIAGNOSIS — F4322 Adjustment disorder with anxiety: Secondary | ICD-10-CM | POA: Diagnosis not present

## 2018-01-02 DIAGNOSIS — E6609 Other obesity due to excess calories: Secondary | ICD-10-CM | POA: Diagnosis not present

## 2018-01-02 DIAGNOSIS — M724 Pseudosarcomatous fibromatosis: Secondary | ICD-10-CM | POA: Diagnosis not present

## 2018-01-02 DIAGNOSIS — Z1389 Encounter for screening for other disorder: Secondary | ICD-10-CM | POA: Diagnosis not present

## 2018-01-02 DIAGNOSIS — Z6830 Body mass index (BMI) 30.0-30.9, adult: Secondary | ICD-10-CM | POA: Diagnosis not present

## 2018-01-02 DIAGNOSIS — G4709 Other insomnia: Secondary | ICD-10-CM | POA: Diagnosis not present

## 2018-01-08 DIAGNOSIS — M25652 Stiffness of left hip, not elsewhere classified: Secondary | ICD-10-CM | POA: Diagnosis not present

## 2018-01-13 ENCOUNTER — Other Ambulatory Visit: Payer: Self-pay | Admitting: Orthopedic Surgery

## 2018-01-13 DIAGNOSIS — M545 Low back pain, unspecified: Secondary | ICD-10-CM

## 2018-01-13 DIAGNOSIS — M5410 Radiculopathy, site unspecified: Secondary | ICD-10-CM

## 2018-01-17 ENCOUNTER — Ambulatory Visit (INDEPENDENT_AMBULATORY_CARE_PROVIDER_SITE_OTHER): Payer: BLUE CROSS/BLUE SHIELD | Admitting: Allergy & Immunology

## 2018-01-17 ENCOUNTER — Encounter: Payer: Self-pay | Admitting: Allergy & Immunology

## 2018-01-17 VITALS — BP 130/62 | HR 87 | Resp 16

## 2018-01-17 DIAGNOSIS — K2100 Gastro-esophageal reflux disease with esophagitis, without bleeding: Secondary | ICD-10-CM

## 2018-01-17 DIAGNOSIS — J301 Allergic rhinitis due to pollen: Secondary | ICD-10-CM | POA: Diagnosis not present

## 2018-01-17 DIAGNOSIS — R0602 Shortness of breath: Secondary | ICD-10-CM

## 2018-01-17 NOTE — Progress Notes (Signed)
FOLLOW UP  Date of Service/Encounter:  01/17/18   Assessment:    Shortness of breath - not responsive to asthma medications   Perennial and seasonal allergic rhinitis (grasses, trees, outdoor molds, cockroach)  Chronic nonintractable headache  Drug allergy- penicillins  Allergic contact dermatitis due to metals  Gastroesophageal reflux disease  Fibromyalgia     Plan/Recommendations:   1. Shortness of breath - Lung function looks stable today. - Continue with albuterol as needed. - It is still unclear whether this is asthma.  - Methacholine challenge is a consideration but she is stable without controllers, so we will hold off on this. - She is overall averse to using any medications.   2. Seasonal allergic rhinitis (trees, grasses and molds) - Continue Oralair. - Call us with an update in January. - We could transition you to the other product if needed.  - Continue with cetirizine 10mg  daily.   3. GERD  - Continue with Nexium 40 mg. - We will work on getting your GI records.  4. Return in about 6 months (around 07/19/2018).      Subjective:   Joyce Herrera is a 57 y.o. female presenting today for follow up of  Chief Complaint  Patient presents with  . Follow-up    Joyce Herrera has a history of the following: Patient Active Problem List   Diagnosis Date Noted  . Esophageal dysphagia 05/21/2017  . Gastroesophageal reflux disease 04/23/2017  . Vaginal irritation 07/19/2016  . Urinary frequency 07/19/2016  . Pelvic relaxation due to cystocele, midline 07/19/2016  . Atrophic vaginitis 07/19/2016  . Seasonal allergic rhinitis due to pollen 07/17/2016  . Moderate persistent asthma, uncomplicated 67/20/9470  . Drug allergy 07/17/2016  . Chronic nonintractable headache 07/17/2016  . Allergic contact dermatitis due to metals 07/17/2016  . Class 1 obesity due to excess calories without serious comorbidity with body mass index (BMI) of  30.0 to 30.9 in adult 03/14/2016  . Family hx of colon cancer 09/16/2015  . History of colonic polyps 09/16/2015  . Cough 09/15/2015  . Laryngopharyngeal reflux (LPR) 09/15/2015  . Subclinical hyperthyroidism 08/24/2015  . Hyperlipidemia 08/24/2015    History obtained from: chart review and patient.  Joyce Herrera's Primary Care Provider is Redmond School, MD.     Joyce Herrera is a 57 y.o. female presenting for a follow up visit. She was last seen in March 2019. At that time, her lung function looked improved, but she was off of inhalers. Therefore I recommended that she remain off of this. She was having some problems with swallowing which I felt was related to possible EoE. For her allergic rhinitis, we continued her on Oralair daily. I recommended stopping her nose sprays and we started Xhance two sprays per nostril daily instead. We continued her on Nexium for her GERD.  Since the last visit, Star has done well on her Oralair. In fact she has had no sinus infections at all since starting the Oralair. She cannot remember the last time that she needed antibiotics. There is a back order on Oralair and she is requesting refills.  Her SOB is stable. She does report that she was wheezing yesterday and "could have used her inhaler". But she just does not carry it with her. She was out shopping with her husband when the shortness of breath occurred. ACT score is 21, indicating excellent asthma control. She has not needed any prednisone since that time. She has still remained off of all of her  controller medications and has felt fine with this.    Otherwise, there have been no changes to her past medical history, surgical history, family history, or social history.    Review of Systems: a 14-point review of systems is pertinent for what is mentioned in HPI.  Otherwise, all other systems were negative.  Constitutional: negative other than that listed in the HPI Eyes: negative other than that listed  in the HPI Ears, nose, mouth, throat, and face: negative other than that listed in the HPI Respiratory: negative other than that listed in the HPI Cardiovascular: negative other than that listed in the HPI Gastrointestinal: negative other than that listed in the HPI Genitourinary: negative other than that listed in the HPI Integument: negative other than that listed in the HPI Hematologic: negative other than that listed in the HPI Musculoskeletal: negative other than that listed in the HPI Neurological: negative other than that listed in the HPI Allergy/Immunologic: negative other than that listed in the HPI    Objective:   Blood pressure 130/62, pulse 87, resp. rate 16, last menstrual period 01/30/1992, SpO2 (!) 89 %. There is no height or weight on file to calculate BMI.   Physical Exam:  General: Alert, interactive, in no acute distress.  Eyes: No conjunctival injection bilaterally, no discharge on the right, no discharge on the left and no Horner-Trantas dots present. PERRL bilaterally. EOMI without pain. No photophobia.  Ears: Right TM pearly gray with normal light reflex, Left TM pearly gray with normal light reflex, Right TM intact without perforation and Left TM intact without perforation.  Nose/Throat: External nose within normal limits and septum midline. Turbinates edematous without discharge. Posterior oropharynx mildly erythematous without cobblestoning in the posterior oropharynx. Tonsils 2+ without exudates.  Tongue without thrush. Lungs: Clear to auscultation without wheezing, rhonchi or rales. No increased work of breathing. CV: Normal S1/S2. No murmurs. Capillary refill <2 seconds.  Skin: Warm and dry, without lesions or rashes. Neuro:   Grossly intact. No focal deficits appreciated. Responsive to questions.  Diagnostic studies:   Spirometry: results abnormal (FEV1: 1.78/75%, FVC: 2.23/70%, FEV1/FVC: 79%).    Spirometry consistent with possible restrictive  disease.   Allergy Studies: none    Joyce Marvel, MD  [facility]

## 2018-01-17 NOTE — Patient Instructions (Addendum)
1. Shortness of breath - Lung function looks stable today. - Continue with albuterol as needed. - It is still unclear whether this is asthma.   2. Seasonal allergic rhinitis (trees, grasses and molds) - Continue Oralair. - Call us with an update in January. - We could transition you to the other product if needed.  - Continue with cetirizine 10mg  daily.   3. GERD  - Continue with Nexium 40 mg. - We will work on getting your GI records.  4. Return in about 6 months (around 07/19/2018).   Please inform us of any Emergency Department visits, hospitalizations, or changes in symptoms. Call us before going to the ED for breathing or allergy symptoms since we might be able to fit you in for a sick visit. Feel free to contact us anytime with any questions, problems, or concerns.  It was a pleasure to see you again today!  Websites that have reliable patient information: 1. American Academy of Asthma, Allergy, and Immunology: www.aaaai.org 2. Food Allergy Research and Education (FARE): foodallergy.org 3. Mothers of Asthmatics: http://www.asthmacommunitynetwork.org 4. American College of Allergy, Asthma, and Immunology: MonthlyElectricBill.co.uk   Make sure you are registered to vote! If you have moved or changed any of your contact information, you will need to get this updated before voting!    Voter ID laws are going into effect in 2020! Be prepared! Check out http://levine.[email or web address] for more details.

## 2018-01-28 ENCOUNTER — Ambulatory Visit
Admission: RE | Admit: 2018-01-28 | Discharge: 2018-01-28 | Disposition: A | Discharge status: Home / Self Care | Payer: BLUE CROSS/BLUE SHIELD | Source: Ambulatory Visit | Attending: Orthopedic Surgery | Admitting: Orthopedic Surgery

## 2018-01-28 DIAGNOSIS — M545 Low back pain, unspecified: Secondary | ICD-10-CM

## 2018-01-28 DIAGNOSIS — M5126 Other intervertebral disc displacement, lumbar region: Secondary | ICD-10-CM | POA: Diagnosis not present

## 2018-01-28 DIAGNOSIS — M5410 Radiculopathy, site unspecified: Secondary | ICD-10-CM

## 2018-02-04 DIAGNOSIS — M25552 Pain in left hip: Secondary | ICD-10-CM | POA: Diagnosis not present

## 2018-02-04 DIAGNOSIS — M25652 Stiffness of left hip, not elsewhere classified: Secondary | ICD-10-CM | POA: Diagnosis not present

## 2018-02-04 DIAGNOSIS — R262 Difficulty in walking, not elsewhere classified: Secondary | ICD-10-CM | POA: Diagnosis not present

## 2018-02-14 ENCOUNTER — Telehealth: Payer: Self-pay

## 2018-02-14 NOTE — Telephone Encounter (Signed)
Patient called and stated that she is on Oralair and has about a week left and the pharmacy is stating that they would have to order it and it would take more than a week to get it to her. Patient stated that Dr. Ernst Bowler mentioned something about another route instead of Oralair. Patient is interested in the other option and stated trying to get his medication is ridiculous. Please advise.

## 2018-02-17 ENCOUNTER — Telehealth: Payer: Self-pay | Admitting: Allergy & Immunology

## 2018-02-17 NOTE — Telephone Encounter (Signed)
Dr Ernst Bowler please advise there is another message in her chart as well

## 2018-02-17 NOTE — Telephone Encounter (Signed)
We can change her to [place] daily. I think we have samples.   Salvatore Marvel, MD

## 2018-02-17 NOTE — Telephone Encounter (Signed)
Pt called and said that she called Friday about the oralair been change to something else because she is having trouble getting it. [phone]

## 2018-02-17 NOTE — Telephone Encounter (Signed)
Addressed in a different Encounter.   Salvatore Marvel, MD

## 2018-02-18 NOTE — Telephone Encounter (Signed)
Will consult with Beth about Grastek samples before consulting with patient.

## 2018-02-18 NOTE — Telephone Encounter (Signed)
Per Eustaquio Maize will continue to give Oralair samples since there are no Grastek samples in the [place] office. Will give the patient Oralair samples until things resolve with issues in receiving Oralair at the pharmacy.

## 2018-06-27 ENCOUNTER — Other Ambulatory Visit: Payer: Self-pay

## 2018-06-27 ENCOUNTER — Encounter: Payer: Self-pay | Admitting: Podiatry

## 2018-06-27 ENCOUNTER — Ambulatory Visit (INDEPENDENT_AMBULATORY_CARE_PROVIDER_SITE_OTHER): Payer: BLUE CROSS/BLUE SHIELD

## 2018-06-27 ENCOUNTER — Ambulatory Visit: Payer: BLUE CROSS/BLUE SHIELD | Admitting: Podiatry

## 2018-06-27 ENCOUNTER — Other Ambulatory Visit: Payer: Self-pay | Admitting: Podiatry

## 2018-06-27 VITALS — Temp 97.3°F

## 2018-06-27 DIAGNOSIS — M79672 Pain in left foot: Secondary | ICD-10-CM

## 2018-06-27 DIAGNOSIS — M79671 Pain in right foot: Secondary | ICD-10-CM

## 2018-06-27 DIAGNOSIS — M722 Plantar fascial fibromatosis: Secondary | ICD-10-CM

## 2018-06-27 MED ORDER — TRIAMCINOLONE ACETONIDE 10 MG/ML IJ SUSP
10.0000 mg | Freq: Once | INTRAMUSCULAR | Status: AC
  Administered 2018-06-27: 10 mg

## 2018-06-28 NOTE — Progress Notes (Signed)
Subjective:   Patient ID: Joyce Herrera, female   DOB: 58 y.o.   MRN: [id number]   HPI Patient presents stating she wants to get injections of both heels and states that they are not very tender and she is ultimately interested in surgery   ROS      Objective:  Physical Exam  Neurovascular status intact with exquisite discomfort plantar heel region bilateral which seems to improve for about 4 5 months at a time with injections     Assessment:  Chronic plantar fasciitis bilateral     Plan:  Reviewed conditions and at this point recommended continued injection with consideration of surgery and explained procedure.  Patient at this point had sterile preps injected the plantar fascial bilateral 3 mg Kenalog 5 mg Xylocaine continue stretching exercises supportive shoes and if symptoms get worse or quicker or get a need to consider surgical intervention

## 2018-07-02 IMAGING — US ULTRASOUND LEFT BREAST LIMITED
1 series · 10 of 10 positions shown · non-contrast
Comparison: Previous exam(s).

ACR Breast Density Category a: The breast tissue is almost entirely
fatty.

CLINICAL DATA: 55-year-old female presenting for evaluation of a
painful area of concern in the left breast for over 3 months. The
patient states she also had trauma to the upper-inner left breast
after hitting a door.

EXAM:
2D DIGITAL DIAGNOSTIC BILATERAL MAMMOGRAM WITH CAD AND ADJUNCT TOMO
ULTRASOUND LEFT BREAST

[Series 1: ultrasound left breast limited · 0.07mm/px · 10 of 10 slices shown]
[im 1/10]
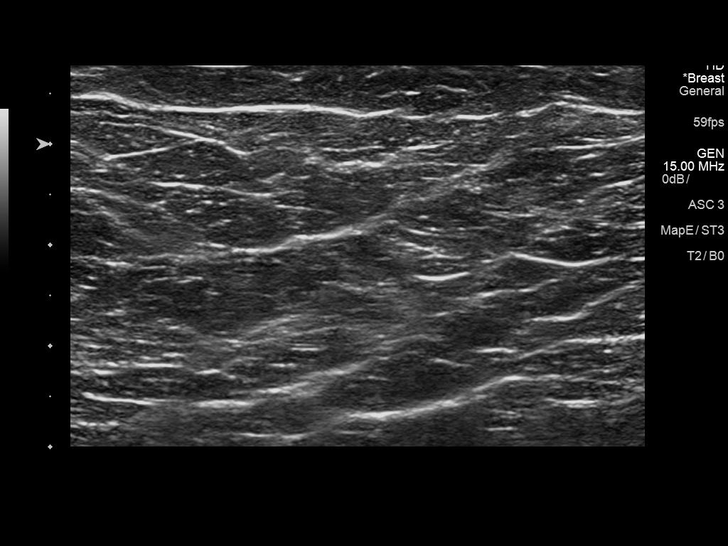
[im 2/10]
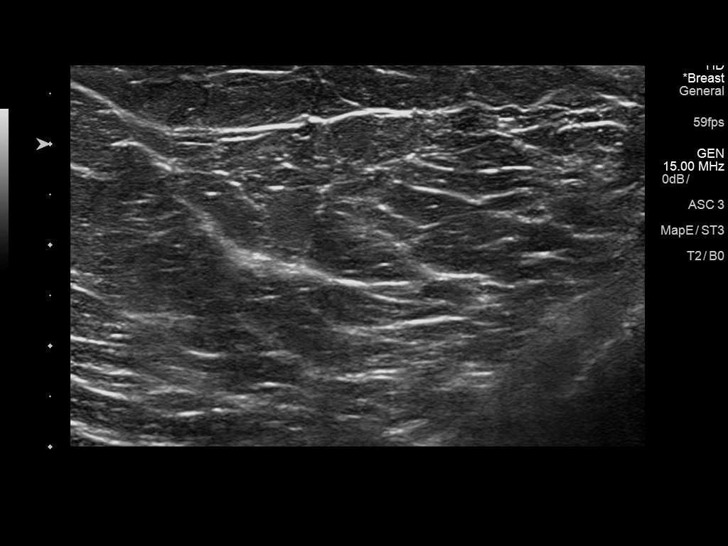
[im 3/10]
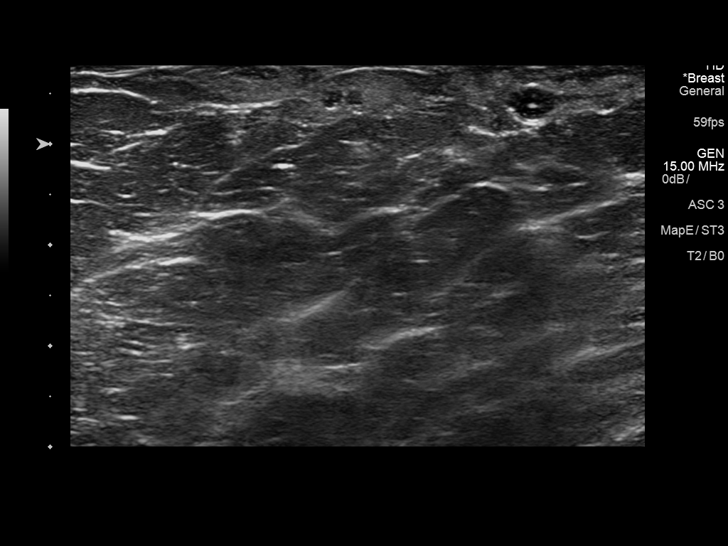
[im 4/10]
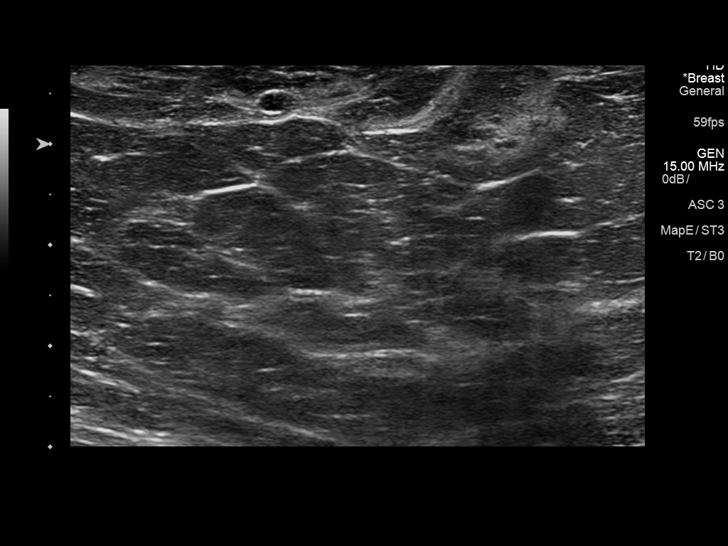
[im 5/10]
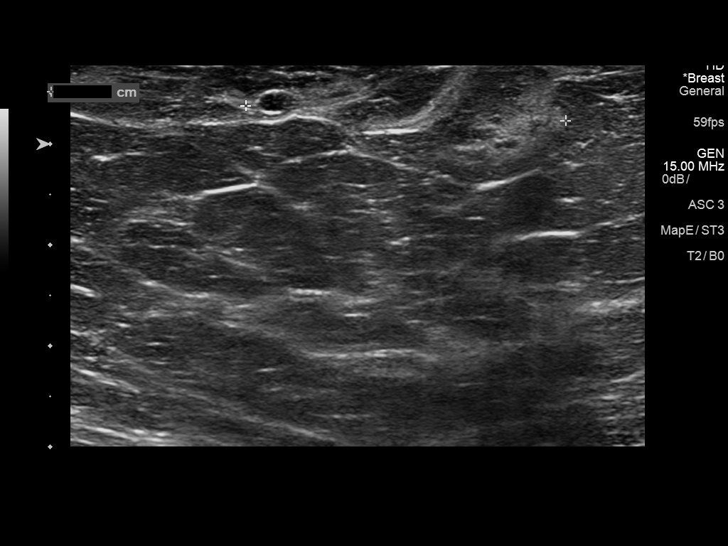
[im 6/10]
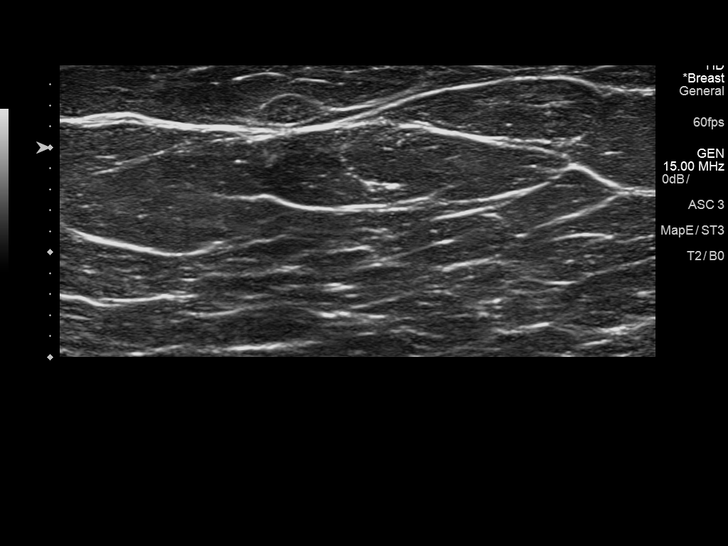
[im 7/10]
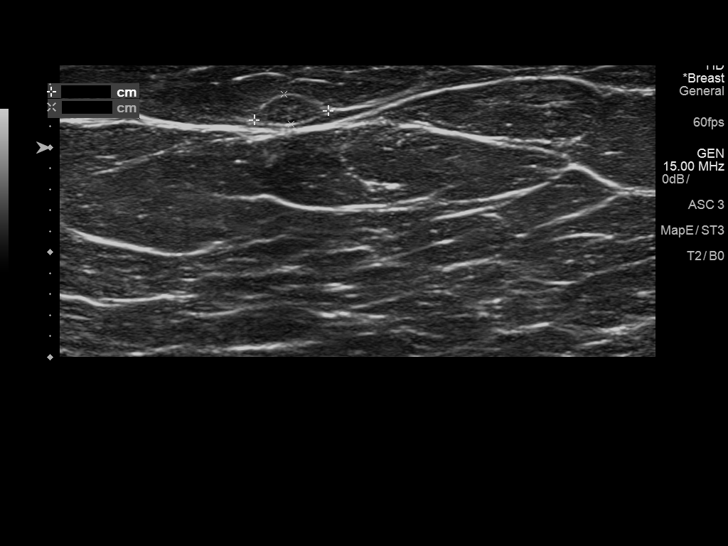
[im 8/10]
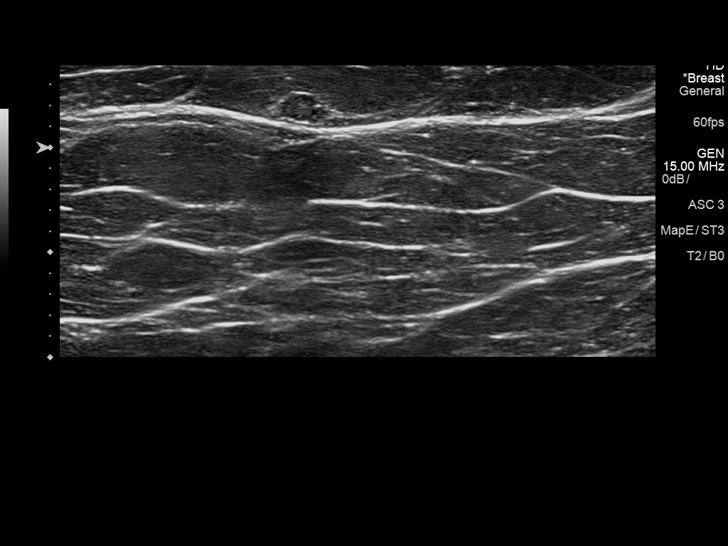
[im 9/10]
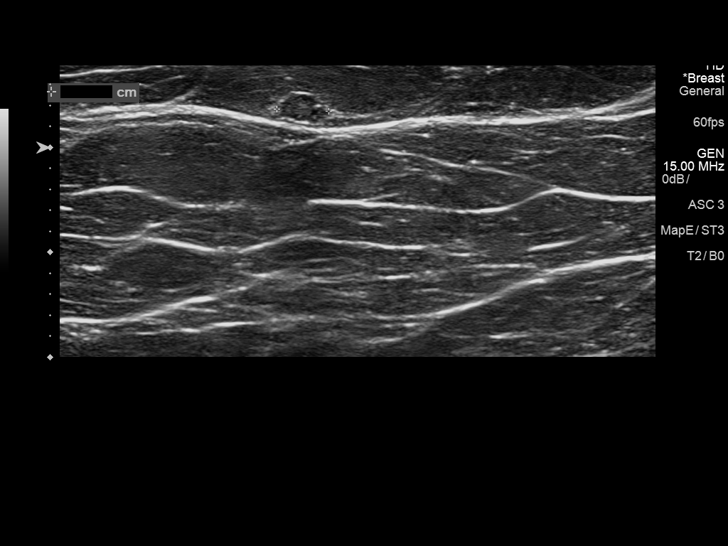
[im 10/10]
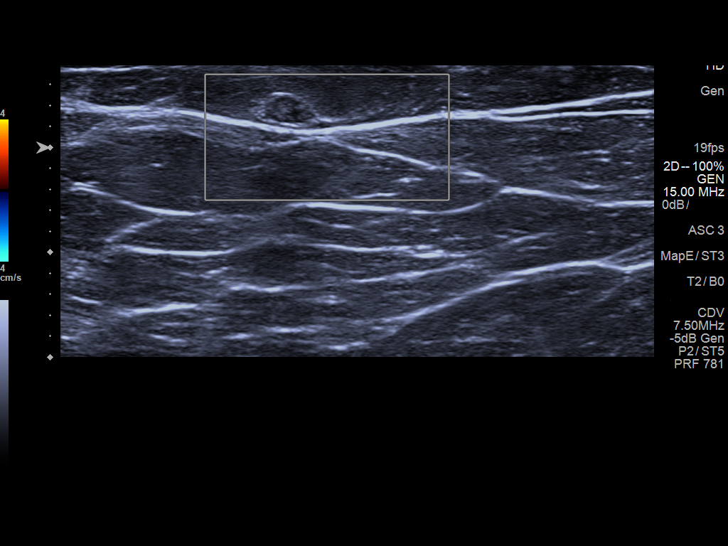

[10 of 10 positions shown; findings below may reference images not displayed]

FINDINGS: A BB has been placed on the inferior aspect of the left breast near
the inframammary fold indicating the site of pain. There are no
suspicious mammographic finding seen deep to this marker. In the
lower-inner quadrant of the left breast, anterior depth there is an
asymmetry in the superficial aspect of the breast spanning
approximately 2.6 cm, and is interspersed with fat. In the
upper-outer left breast, anterior depth there is a superficial oval
circumscribed mass measuring approximately 6 mm. There does appear
to be fat within the mass mammographically. No other suspicious
calcifications, masses or areas of distortion are seen in the
bilateral breasts.

Mammographic images were processed with CAD.

On physical exam, no discrete palpable mass is identified in the
inferior left breast at the site of pain. There is slight bruising
on the upper inner quadrant of the left breast at the site of
suspected fat necrosis. No palpable masses are identified. No
palpable masses are identified in the upper-outer quadrant of the
left breast.

Targeted ultrasound is performed, showing normal fibroglandular
tissue at the site of pain at 6 o'clock in the left breast.

Ultrasound of the left breast at 10 o'clock, 8 cm from the nipple
demonstrates a superficial area of increased echogenicity with
anechoic cystic masses, consistent with fat necrosis. This is deep
to the superficial skin bruising. In the upper-outer quadrant of the
left breast at 1 o'clock, 4 cm from the nipple there is a
circumscribed isoechoic oval mass measuring 7 x 3 x 5 mm. This may
represent fat necrosis as well, likely corresponding with the mass
identified mammographically.
IMPRESSION: 1. No mammographic or targeted sonographic correlate for the site of
pain in the inferior left breast.

2. There is a benign area of fat necrosis in the upper inner left
breast at the site of bruising.

3. There is a probably benign mass in the upper-outer left breast at
1 o'clock, which may represent an additional site of fat necrosis.

4.  No mammographic evidence of right breast malignancy.

RECOMMENDATION:
1. Clinical follow-up recommended for the painful area of concern in
the left breast. Any further workup should be based on clinical
grounds.

2. Six-month follow-up diagnostic left breast mammogram and possible
ultrasound is recommended for evaluation of the probably benign left
breast mass at 1 o'clock.

I have discussed the findings and recommendations with the patient.
Results were also provided in writing at the conclusion of the
visit. If applicable, a reminder letter will be sent to the patient
regarding the next appointment.

BI-RADS CATEGORY  3: Probably benign.

## 2018-07-27 ENCOUNTER — Other Ambulatory Visit: Payer: Self-pay | Admitting: Allergy & Immunology

## 2018-08-11 ENCOUNTER — Other Ambulatory Visit: Payer: Self-pay | Admitting: Allergy & Immunology

## 2018-08-15 DIAGNOSIS — E663 Overweight: Secondary | ICD-10-CM | POA: Diagnosis not present

## 2018-08-15 DIAGNOSIS — Z23 Encounter for immunization: Secondary | ICD-10-CM | POA: Diagnosis not present

## 2018-08-15 DIAGNOSIS — I10 Essential (primary) hypertension: Secondary | ICD-10-CM | POA: Diagnosis not present

## 2018-08-15 DIAGNOSIS — F329 Major depressive disorder, single episode, unspecified: Secondary | ICD-10-CM | POA: Diagnosis not present

## 2018-08-15 DIAGNOSIS — G43709 Chronic migraine without aura, not intractable, without status migrainosus: Secondary | ICD-10-CM | POA: Diagnosis not present

## 2018-08-15 DIAGNOSIS — Z6829 Body mass index (BMI) 29.0-29.9, adult: Secondary | ICD-10-CM | POA: Diagnosis not present

## 2018-08-15 DIAGNOSIS — E7849 Other hyperlipidemia: Secondary | ICD-10-CM | POA: Diagnosis not present

## 2018-08-15 DIAGNOSIS — E063 Autoimmune thyroiditis: Secondary | ICD-10-CM | POA: Diagnosis not present

## 2018-09-04 ENCOUNTER — Telehealth: Payer: Self-pay

## 2018-09-04 ENCOUNTER — Other Ambulatory Visit: Payer: Self-pay | Admitting: Allergy & Immunology

## 2018-09-04 NOTE — Telephone Encounter (Signed)
error 

## 2018-09-05 ENCOUNTER — Other Ambulatory Visit: Payer: Self-pay

## 2018-09-05 ENCOUNTER — Other Ambulatory Visit: Payer: Self-pay | Admitting: Allergy & Immunology

## 2018-09-05 MED ORDER — ORALAIR 300 IR SL SUBL: 1.0000 | SUBLINGUAL_TABLET | Freq: Every day | SUBLINGUAL | 0 refills | Status: DC

## 2018-09-05 NOTE — Telephone Encounter (Signed)
Patient is requesting a courtesy refill for oral air. She was due for an appt and made a tele visit for 8-12. Pharmacy is [facility].

## 2018-09-05 NOTE — Telephone Encounter (Signed)
Prescription refill has been sent to specialty pharmacy. Patient is scheduled for next week.

## 2018-09-10 ENCOUNTER — Ambulatory Visit (INDEPENDENT_AMBULATORY_CARE_PROVIDER_SITE_OTHER): Payer: BC Managed Care – PPO | Admitting: Allergy & Immunology

## 2018-09-10 ENCOUNTER — Encounter: Payer: Self-pay | Admitting: Allergy & Immunology

## 2018-09-10 ENCOUNTER — Other Ambulatory Visit: Payer: Self-pay

## 2018-09-10 DIAGNOSIS — J301 Allergic rhinitis due to pollen: Secondary | ICD-10-CM | POA: Diagnosis not present

## 2018-09-10 DIAGNOSIS — K2100 Gastro-esophageal reflux disease with esophagitis, without bleeding: Secondary | ICD-10-CM

## 2018-09-10 DIAGNOSIS — Z889 Allergy status to unspecified drugs, medicaments and biological substances status: Secondary | ICD-10-CM

## 2018-09-10 DIAGNOSIS — R0602 Shortness of breath: Secondary | ICD-10-CM | POA: Diagnosis not present

## 2018-09-10 MED ORDER — ALBUTEROL SULFATE HFA 108 (90 BASE) MCG/ACT IN AERS: 1.0000 | INHALATION_SPRAY | Freq: Four times a day (QID) | RESPIRATORY_TRACT | 1 refills | Status: DC | PRN

## 2018-09-10 MED ORDER — ORALAIR 300 IR SL SUBL: 1.0000 | SUBLINGUAL_TABLET | Freq: Every day | SUBLINGUAL | 11 refills | Status: DC

## 2018-09-10 NOTE — Progress Notes (Signed)
RE: Joyce Herrera MRN: [id number] DOB: 08-19-1960 Date of Telemedicine Visit: 09/10/2018  Referring provider: Redmond School, MD Primary care provider: Redmond School, MD  Chief Complaint: Asthma (Televisit at home. Patient gave verbal consent to treat and bill insurance for this visit.)   Telemedicine Follow Up Visit via Telephone: I connected with Joyce Herrera for a follow up on 09/10/18 by telephone and verified that I am speaking with the correct person using two identifiers.   I discussed the limitations, risks, security and privacy concerns of performing an evaluation and management service by telephone and the availability of in person appointments. I also discussed with the patient that there may be a patient responsible charge related to this service. The patient expressed understanding and agreed to proceed.  Patient is at home.  Provider is at the office.  Visit start time: 9:59 AM Visit end time: 10:25 AM Insurance consent/check in by: Anderson [facility] Medical consent and medical assistant/nurse: Caryl Pina  History of Present Illness:  She is a 58 y.o. female, who is being followed for seasonal rhinitis as well as possible asthma. Her previous allergy office visit was in August 2020 with myself.  At her last visit, her lung function looks stable.  We continued with albuterol as needed.  We have never been clearing her diagnosis of asthma and we discussed doing a methacholine challenge, but she was comfortable avoiding this since she was stable.  She has a history of allergic rhinitis to trees, grasses, and molds.  She was doing very well on Oralair as well as cetirizine 10 mg daily.  She was also doing well on Nexium 40 mg daily.  Since the last visit, she has done well. She has actually lost several pounds and is now down to 170. She has been eating better and has been trying to do more physical activity.   Asthma/Respiratory Symptom History: She does have albuterol as well as  Symbicort at home. she does seem confused about when to use each one and in fact she only uses them when she is experiencing shortness of breath. She estimates that this is around once per week. She sometimes does the Symbicort and sometimes does the albuterol. She has not needed any prednisone at all for her symptoms.  Allergic Rhinitis Symptom History: She is using the Oralair daily. She also remains on the cetirizine daily. She does report that over the last couple of months, she has had crusting in both of her eyes. She was given an eye drop by her eye doctor (possibly [place]) which did not help. She has used Social research officer, government which does provide some relief. She is wondering whether she can get some samples next time we are back in [place]. Overall her symptoms have remained stable. She is not very interested in allergen immunotherapy but is somewhat open to it.   She remains on her reflux medication but is only taking it intermittently. She prefers to not take something on a daily basis.   Otherwise, there have been no changes to her past medical history, surgical history, family history, or social history.  Assessment and Plan:  Joyce Herrera is a 58 y.o. female with:  Shortness of breath - not responsive to asthma medications  Perennial and seasonalallergic rhinitis(grasses, trees, outdoor molds, cockroach)  Chronic nonintractable headache  Drug allergy- penicillins  Allergic contact dermatitis due to metals  Gastroesophageal reflux disease  Fibromyalgia   1. Shortness of breath - Lung function deferred today.  - Continue with albuterol as  needed. - It is still unclear whether this is asthma.   2. Seasonal allergic rhinitis (trees, grasses and molds) - Continue Oralair. - Continue with cetirizine 10mg  daily.  - Come by the office in one week and we can give you some eye drop samples. - Consider starting allergy shots in the future to cover the trees and molds as well.    3. GERD  - Continue with Nexium 40 mg as needed.   4. Return in about 6 months (around 03/13/2019).    Diagnostics: None.  Medication List:  Current Outpatient Medications  Medication Sig Dispense Refill  . albuterol (VENTOLIN HFA) 108 (90 Base) MCG/ACT inhaler Inhale 1-2 puffs into the lungs every 6 (six) hours as needed for wheezing or shortness of breath. 18 g 1  . ALPRAZolam (XANAX) 0.5 MG tablet Take 0.25 mg by mouth daily as needed for anxiety.     Marland Kitchen azelastine (ASTELIN) 0.1 % nasal spray Place 2 sprays into both nostrils 2 (two) times daily as needed for allergies. 30 mL 5  . calcium-vitamin D (OSCAL WITH D) 500-200 MG-UNIT tablet Take 1 tablet by mouth daily with breakfast.    . DUEXIS 800-26.6 MG TABS Take 1 tablet by mouth 3 (three) times daily as needed for pain.  3  . esomeprazole (NEXIUM) 40 MG capsule Take 1 capsule (40 mg total) by mouth daily before breakfast. 30 capsule 5  . flurbiprofen (ANSAID) 100 MG tablet Take 100 mg by mouth daily as needed (headache).   4  . furosemide (LASIX) 20 MG tablet Take 20 mg by mouth daily as needed for fluid.   10  . Grass Mix Pollens Allergen Ext (ORALAIR) 300 IR SUBL Place 1 tablet under the tongue daily. 30 tablet 11  . Multiple Vitamin (MULTIVITAMIN WITH MINERALS) TABS tablet Take 1 tablet by mouth daily.    Marland Kitchen olopatadine (PATANOL) 0.1 % ophthalmic solution     . OVER THE COUNTER MEDICATION Take 1 capsule by mouth 2 (two) times daily. Hyaluronic acid, Collagen, and MSM    . pregabalin (LYRICA) 300 MG capsule Take 300 mg by mouth daily.    . propranolol (INDERAL) 20 MG tablet Take 20 mg by mouth at bedtime.    . SYMBICORT 160-4.5 MCG/ACT inhaler INHALE 2 PUFFS INTO LUNGS TWICE DAILY. 10.2 g 0  . zolpidem (AMBIEN) 10 MG tablet Take 5 mg by mouth at bedtime.   1  . fluticasone (FLONASE) 50 MCG/ACT nasal spray Place 2 sprays into both nostrils daily as needed.    Marland Kitchen UNABLE TO FIND Take 25 mg by mouth daily. Takes CBD by mouth      No  current facility-administered medications for this visit.    Allergies: Allergies  Allergen Reactions  . Cephalexin Other (See Comments)    Pt stated, "Ears started to itch; hands and feet were itching"  . Penicillins Rash    Has patient had a PCN reaction causing immediate rash, facial/tongue/throat swelling, SOB or lightheadedness with hypotension: No Has patient had a PCN reaction causing severe rash involving mucus membranes or skin necrosis: No Has patient had a PCN reaction that required hospitalization: No Has patient had a PCN reaction occurring within the last 10 years: No If all of the above answers are "NO", then may proceed with Cephalosporin use.    I reviewed her past medical history, social history, family history, and environmental history and no significant changes have been reported from previous visits.  Review of Systems  Constitutional:  Negative for activity change, appetite change, chills, fatigue and fever.  HENT: Negative for congestion, postnasal drip, rhinorrhea, sinus pressure and sore throat.   Eyes: Positive for discharge and itching. Negative for pain and redness.  Respiratory: Negative for shortness of breath, wheezing and stridor.   Gastrointestinal: Negative for diarrhea, nausea and vomiting.  Musculoskeletal: Negative for arthralgias, joint swelling and myalgias.  Skin: Negative for rash.  Allergic/Immunologic: Negative for environmental allergies and food allergies.    Objective:  Physical exam not obtained as encounter was done via telephone.   Previous notes and tests were reviewed.  I discussed the assessment and treatment plan with the patient. The patient was provided an opportunity to ask questions and all were answered. The patient agreed with the plan and demonstrated an understanding of the instructions.   The patient was advised to call back or seek an in-person evaluation if the symptoms worsen or if the condition fails to improve as  anticipated.  I provided 26 minutes of non-face-to-face time during this encounter.  It was my pleasure to participate in [place] care today. Please feel free to contact me with any questions or concerns.   Sincerely,  Valentina Shaggy, MD

## 2018-09-10 NOTE — Patient Instructions (Addendum)
1. Shortness of breath - Lung function deferred today.  - Continue with albuterol as needed. - It is still unclear whether this is asthma.   2. Seasonal allergic rhinitis (trees, grasses and molds) - Continue Oralair. - Continue with cetirizine 10mg  daily.  - Come by the office in one week and we can give you some eye drop samples. - Consider starting allergy shots in the future to cover the trees and molds as well.   3. GERD  - Continue with Nexium 40 mg as needed.   4. Return in about 6 months (around 03/13/2019).   Please inform us of any Emergency Department visits, hospitalizations, or changes in symptoms. Call us before going to the ED for breathing or allergy symptoms since we might be able to fit you in for a sick visit. Feel free to contact us anytime with any questions, problems, or concerns.  It was a pleasure to see you again today!  Websites that have reliable patient information: 1. American Academy of Asthma, Allergy, and Immunology: www.aaaai.org 2. Food Allergy Research and Education (FARE): foodallergy.org 3. Mothers of Asthmatics: http://www.asthmacommunitynetwork.org 4. American College of Allergy, Asthma, and Immunology: MonthlyElectricBill.co.uk   Make sure you are registered to vote! If you have moved or changed any of your contact information, you will need to get this updated before voting!    Voter ID laws are going into effect in 2020! Be prepared! Check out http://levine.[email or web address] for more details.

## 2018-09-11 DIAGNOSIS — Z96652 Presence of left artificial knee joint: Secondary | ICD-10-CM | POA: Diagnosis not present

## 2018-09-11 DIAGNOSIS — M1711 Unilateral primary osteoarthritis, right knee: Secondary | ICD-10-CM | POA: Diagnosis not present

## 2018-10-15 ENCOUNTER — Telehealth: Payer: Self-pay | Admitting: Allergy & Immunology

## 2018-10-15 MED ORDER — BEPREVE 1.5 % OP SOLN: 1.0000 [drp] | Freq: Two times a day (BID) | OPHTHALMIC | 6 refills | Status: AC | PRN

## 2018-10-15 NOTE — Telephone Encounter (Signed)
Patient was given a sample of Bepreve eye drops. She said it is working and is requesting a prescription be sent in for this. [facility] in [place].

## 2018-10-15 NOTE — Telephone Encounter (Signed)
Dr. Ernst Bowler please advise if it is ok to send in a prescription.

## 2018-10-15 NOTE — Telephone Encounter (Signed)
That is fine with me - one drop per eye BID PRN. Rx sent in.   Salvatore Marvel, MD Allergy and [place] [facility] [place]

## 2018-10-16 ENCOUNTER — Telehealth: Payer: Self-pay | Admitting: *Deleted

## 2018-10-16 NOTE — Telephone Encounter (Signed)
PA has been initiated for Bepreve via CoverMyMeds and is currently pending.

## 2018-10-16 NOTE — Telephone Encounter (Signed)
Called patient and informed. Patient verbalized understanding.  

## 2018-10-20 NOTE — Telephone Encounter (Signed)
PA was denied for Bepreve. Patient must try and fail Epinastine or Azelastine. Please advise.

## 2018-10-21 MED ORDER — EPINASTINE HCL 0.05 % OP SOLN: 1.0000 [drp] | Freq: Two times a day (BID) | OPHTHALMIC | 5 refills | Status: DC | PRN

## 2018-10-21 NOTE — Telephone Encounter (Signed)
I am 90% sure that she is already done as a lasting eyedrops.  Please send in epinastine eyedrops 1 drop per eye twice daily as needed.  Salvatore Marvel, MD Allergy and [place] [facility] [place]

## 2018-10-21 NOTE — Addendum Note (Signed)
Addended by: Katherina Right D on: 10/21/2018 04:07 PM   Modules accepted: Orders

## 2018-10-21 NOTE — Telephone Encounter (Signed)
Left message for pt to return call. Just need to inform her of med change. Sent in Epinastine.

## 2018-10-22 NOTE — Telephone Encounter (Signed)
Patient calling Logan back.  Informed patient of note per [facility].  Patient will try generic eye drops.  If this burns, she will contact Dr. Ernst Bowler.

## 2018-11-06 DIAGNOSIS — E063 Autoimmune thyroiditis: Secondary | ICD-10-CM | POA: Diagnosis not present

## 2018-11-06 DIAGNOSIS — Z6828 Body mass index (BMI) 28.0-28.9, adult: Secondary | ICD-10-CM | POA: Diagnosis not present

## 2018-11-06 DIAGNOSIS — K219 Gastro-esophageal reflux disease without esophagitis: Secondary | ICD-10-CM | POA: Diagnosis not present

## 2018-11-06 DIAGNOSIS — I10 Essential (primary) hypertension: Secondary | ICD-10-CM | POA: Diagnosis not present

## 2018-11-06 DIAGNOSIS — E7849 Other hyperlipidemia: Secondary | ICD-10-CM | POA: Diagnosis not present

## 2018-11-06 DIAGNOSIS — Z0001 Encounter for general adult medical examination with abnormal findings: Secondary | ICD-10-CM | POA: Diagnosis not present

## 2018-11-06 DIAGNOSIS — Z23 Encounter for immunization: Secondary | ICD-10-CM | POA: Diagnosis not present

## 2018-11-06 DIAGNOSIS — F329 Major depressive disorder, single episode, unspecified: Secondary | ICD-10-CM | POA: Diagnosis not present

## 2018-11-07 DIAGNOSIS — Z0000 Encounter for general adult medical examination without abnormal findings: Secondary | ICD-10-CM | POA: Diagnosis not present

## 2018-11-14 ENCOUNTER — Other Ambulatory Visit (HOSPITAL_COMMUNITY): Payer: Self-pay | Admitting: Family Medicine

## 2018-11-14 DIAGNOSIS — Z1231 Encounter for screening mammogram for malignant neoplasm of breast: Secondary | ICD-10-CM

## 2018-11-21 DIAGNOSIS — M25561 Pain in right knee: Secondary | ICD-10-CM | POA: Diagnosis not present

## 2018-11-21 DIAGNOSIS — M1711 Unilateral primary osteoarthritis, right knee: Secondary | ICD-10-CM | POA: Diagnosis not present

## 2018-11-21 DIAGNOSIS — Z96652 Presence of left artificial knee joint: Secondary | ICD-10-CM | POA: Diagnosis not present

## 2018-12-03 ENCOUNTER — Ambulatory Visit (HOSPITAL_COMMUNITY)
Admission: RE | Admit: 2018-12-03 | Discharge: 2018-12-03 | Disposition: A | Discharge status: Home / Self Care | Payer: BC Managed Care – PPO | Source: Ambulatory Visit | Attending: Family Medicine | Admitting: Family Medicine

## 2018-12-03 ENCOUNTER — Other Ambulatory Visit: Payer: Self-pay

## 2018-12-03 DIAGNOSIS — Z1231 Encounter for screening mammogram for malignant neoplasm of breast: Secondary | ICD-10-CM | POA: Diagnosis not present

## 2018-12-23 ENCOUNTER — Other Ambulatory Visit (HOSPITAL_COMMUNITY): Payer: Self-pay | Admitting: Orthopedic Surgery

## 2018-12-23 ENCOUNTER — Other Ambulatory Visit: Payer: Self-pay | Admitting: Orthopedic Surgery

## 2018-12-23 DIAGNOSIS — Z96652 Presence of left artificial knee joint: Secondary | ICD-10-CM

## 2019-01-01 ENCOUNTER — Ambulatory Visit: Payer: BC Managed Care – PPO | Admitting: Podiatry

## 2019-01-01 ENCOUNTER — Other Ambulatory Visit: Payer: Self-pay

## 2019-01-01 ENCOUNTER — Encounter: Payer: Self-pay | Admitting: Podiatry

## 2019-01-01 DIAGNOSIS — B351 Tinea unguium: Secondary | ICD-10-CM | POA: Diagnosis not present

## 2019-01-01 DIAGNOSIS — M722 Plantar fascial fibromatosis: Secondary | ICD-10-CM | POA: Diagnosis not present

## 2019-01-01 NOTE — Progress Notes (Signed)
Subjective:   Patient ID: Joyce Herrera, female   DOB: 58 y.o.   MRN: [id number]   HPI Patient presents with exquisite discomfort in the heel region bilateral which is flared up over the last few weeks and also concerned about yellow discoloration of the big toenail right   ROS      Objective:  Physical Exam  Neurovascular status intact exquisite discomfort plantar fascial right over left with inflammation fluid in the medial band and discoloration of the right big toenail distal two thirds of the bed right     Assessment:  Acute plantar fasciitis bilateral along with mycotic nail infection right big toenail     Plan:  H&P reviewed conditions and at this point I have recommended continued conservative care and I did sterile prep and injected each plantar fascia 3 mg Kenalog 5 mg Xylocaine and for the right nail I did recommend topical medicine and I placed the patient on formula 7 explaining how it would work

## 2019-01-05 ENCOUNTER — Telehealth: Payer: Self-pay

## 2019-01-05 NOTE — Telephone Encounter (Signed)
I believe we are going to have to wait until her new benefit year starts and send it then.

## 2019-01-05 NOTE — Telephone Encounter (Signed)
Patient called stating she received a letter stating [facility] will no longer dispense the Oralair with BCBS. Patient is trying to find out who should this medication be sent through now.  Patient will have Stover the start of the year. She thinks she has enough till the first of the year.  Please Advise.

## 2019-01-06 NOTE — Telephone Encounter (Signed)
We can try Grastek instead. Maybe the insurance will cover that. Beth loves dealing with the oral immunotherapy companies.   Salvatore Marvel, MD Allergy and [place] [facility] [place]

## 2019-01-07 NOTE — Telephone Encounter (Signed)
I believe the insurance company would still change. I think that we should still wait until the beginning of the year to send out any new meds so that he new prescription benefits can take affect. ??

## 2019-01-08 NOTE — Telephone Encounter (Signed)
Yes.  That sounds like a plan.  In January, best can contact her bodies down in [place], [place].  Or she could contact her good friend Conservation officer, historic buildings.  Salvatore Marvel, MD Allergy and [place] [facility] [place]

## 2019-01-08 NOTE — Telephone Encounter (Signed)
Okay. Sounds okay.

## 2019-01-09 NOTE — Telephone Encounter (Signed)
Hi, I did call and speak with Larene Beach with [facility] regarding Joyce Herrera.  She was not aware of any insurance changes for 2021.  We would need to wait until patient has new insurance and submit new RX and do PA.  We can request samples from [facility] to fill in gap for her to get samples until ? Coverage.  Just let us know which you want to do after she provides [place] with her new insurance.

## 2019-01-26 ENCOUNTER — Telehealth: Payer: Self-pay

## 2019-01-26 NOTE — Telephone Encounter (Signed)
Taking oral air.  Got a letter from BC/BS to say that they are not covering the medication anymore.  Transition pharmacy was transferring the prescription to [facility], so pharmacy  Sent prescription to local [facility], was unable to obtain any product through his vendors. Per patient would like to go with " other kind" would that be easier to obtain than what she is currently using.   Explained to patient in previous message from 01/05/19, that she would have to wait until she receives her new insurance cards and brings them to [place], or scan into my chart, link has been sent.  Pt states that she will try to log into my chart.  But understands that she will need to wait until the first of the year before we can do anything with the insurance.

## 2019-01-27 NOTE — Telephone Encounter (Signed)
We are planning to do Grastek in the new year. Do we have any Oralair samples she could use in the interim?   Salvatore Marvel, MD Allergy and [place] [facility] [place]

## 2019-01-27 NOTE — Telephone Encounter (Signed)
Patient informed of sample availability. Placed up front for her to pick up at her convenience. Will revisit insurance issue in the new year.

## 2019-02-09 ENCOUNTER — Telehealth: Payer: Self-pay | Admitting: Allergy & Immunology

## 2019-02-09 DIAGNOSIS — J301 Allergic rhinitis due to pollen: Secondary | ICD-10-CM

## 2019-02-09 NOTE — Telephone Encounter (Signed)
Patient has changed insurance to [facility]. She is trying to get her Research scientist (medical). She used to get it through [facility], but she wants to know who contracts with [facility] so she can get this prescription.

## 2019-02-09 NOTE — Telephone Encounter (Signed)
After looking back in patients chart I noticed Dr. Gillermina Hu note that states "We are planning to do Grastek in the new year."  I spoke with Patient and she stated which ever is best and will cause the least amount of problems when ordering. She did state that she would like if one of them were able to be sent to a regular pharmacy and not a mail ordered, if so then that is the one she would rather go with. I informed her to still call the pharmacy and see what they say about her updated pharmacy. Dr. Ernst Bowler please advise.

## 2019-02-09 NOTE — Telephone Encounter (Signed)
Called and spoke with patient, informed her to call the pharmacy that she receives the medication through and update her insurance with them. She stated that she has had so much trouble in the past with getting this prescription that she just doesn't want to go through it again. I verbalized understanding and informed her to update it with the pharmacy and if there was any problems to call the office and we would do everything we can to get her medication to her and hopefully without all the issues as before. Patient verbalized understanding and will contact the office if there is any issues.

## 2019-02-10 MED ORDER — GRASTEK 2800 BAU SL SUBL: 1.0000 | SUBLINGUAL_TABLET | Freq: Every day | SUBLINGUAL | 5 refills | Status: DC

## 2019-02-10 NOTE — Addendum Note (Signed)
Addended by: Hartley Barefoot on: 02/10/2019 03:48 PM   Modules accepted: Orders

## 2019-02-10 NOTE — Addendum Note (Signed)
Addended by: Hartley Barefoot on: 02/10/2019 03:17 PM   Modules accepted: Orders

## 2019-02-10 NOTE — Telephone Encounter (Signed)
Let's try to send in [place].  This all is determined on which one is covered by her insurance.  I have no idea how to send in [place], but it is one tablet once daily. We are going to have to fight with the insurance company to see which one is covered.   Joyce Herrera has been involved in this as well. So I am forwarding to her.   Salvatore Marvel, MD Allergy and [place] [facility] [place]

## 2019-02-10 NOTE — Telephone Encounter (Signed)
Joyce Herrera with [employer] called back to confirm phone and address for specialty pharmacy for [place].  [facility], [address] [place], [place].  [phone].   RX sent to [facility]. Left message for patient to call me back so I could inform her of specialty pharmacy contacting her prior to delivery.

## 2019-02-10 NOTE — Telephone Encounter (Signed)
Patient return call to office and was inform of Grastek sent to [facility].  Patient aware pharmacy will contact her prior to shipment of medication.  Patient voiced understanding.

## 2019-02-10 NOTE — Telephone Encounter (Signed)
Called and spoke to Sarah with [facility] to confirm specialty pharmacy for [place].  Per Judson Roch, send RX to [facility] specialty pharmacy and they will Laurian Brim to patient.  [facility] will handle insurance and will make sure coupon gets applied so patient only owes $25.00 for medication.  Judson Roch was driving at the time of our call and she will call me back to confirm address for [facility].

## 2019-02-10 NOTE — Telephone Encounter (Signed)
[place]! I have driven through there quite a few times. There is a very large [employer] store there.   Salvatore Marvel, MD Allergy and [place] [facility] [place]

## 2019-03-13 ENCOUNTER — Ambulatory Visit: Payer: 59 | Admitting: Allergy & Immunology

## 2019-04-18 ENCOUNTER — Ambulatory Visit: Payer: Self-pay | Attending: Internal Medicine

## 2019-04-18 DIAGNOSIS — Z23 Encounter for immunization: Secondary | ICD-10-CM

## 2019-04-18 NOTE — Progress Notes (Signed)
   Covid-19 Vaccination Clinic  Name:  Joyce Herrera    MRN: [id number] DOB: 1960/09/17  04/18/2019  Joyce Herrera was observed post Covid-19 immunization for 15 minutes without incident. She was provided with Vaccine Information Sheet and instruction to access the V-Safe system.   Joyce Herrera was instructed to call 911 with any severe reactions post vaccine: Marland Kitchen Difficulty breathing  . Swelling of face and throat  . A fast heartbeat  . A bad rash all over body  . Dizziness and weakness   Immunizations Administered    Name Date Dose VIS Date Route   Moderna COVID-19 Vaccine 04/18/2019 11:33 AM 0.5 mL 12/30/2018 Intramuscular   Manufacturer: Moderna   Lot: [id number]   [place]PO:[id number]

## 2019-05-20 ENCOUNTER — Ambulatory Visit: Payer: Self-pay | Attending: Internal Medicine

## 2019-05-20 DIAGNOSIS — Z23 Encounter for immunization: Secondary | ICD-10-CM

## 2019-05-20 NOTE — Progress Notes (Signed)
   Covid-19 Vaccination Clinic  Name:  POOJA KRILL    MRN: [id number] DOB: [birth year]  05/20/2019  Ms. Kapral was observed post Covid-19 immunization for 15 minutes without incident. She was provided with Vaccine Information Sheet and instruction to access the V-Safe system.   Ms. Hegg was instructed to call 911 with any severe reactions post vaccine: Marland Kitchen Difficulty breathing  . Swelling of face and throat  . A fast heartbeat  . A bad rash all over body  . Dizziness and weakness   Immunizations Administered    Name Date Dose VIS Date Route   Moderna COVID-19 Vaccine 05/20/2019 10:53 AM 0.5 mL 12/2018 Intramuscular   Manufacturer: Moderna   Lot: [id number]   [place]:[id number]

## 2019-06-03 ENCOUNTER — Other Ambulatory Visit: Payer: Self-pay

## 2019-06-03 ENCOUNTER — Encounter: Payer: Self-pay | Admitting: Podiatry

## 2019-06-03 ENCOUNTER — Ambulatory Visit (INDEPENDENT_AMBULATORY_CARE_PROVIDER_SITE_OTHER): Payer: 59 | Admitting: Podiatry

## 2019-06-03 VITALS — Temp 98.2°F

## 2019-06-03 DIAGNOSIS — L600 Ingrowing nail: Secondary | ICD-10-CM

## 2019-06-03 DIAGNOSIS — M722 Plantar fascial fibromatosis: Secondary | ICD-10-CM | POA: Diagnosis not present

## 2019-06-03 NOTE — Progress Notes (Signed)
Subjective:   Patient ID: Rudolpho Herrera, female   DOB: 59 y.o.   MRN: [id number]   HPI Patient presents stating that she is concerned about her heel pain which is started back again the last couple weeks and also she has a discolored big toenail on the right foot that she wanted checked with consideration for possible ingrown neuro   ROS      Objective:  Physical Exam  Vascular status intact with discoloration of the right hallux medial side localized with exquisite discomfort noted in the plantar heel of both feet     Assessment:  Acute plantar fasciitis bilateral with what appears to be a low-grade mycotic nail infection and ingrown toenail right hallux     Plan:  H&P both conditions reviewed and for the heels I did sterile prep and reinjected the fascia bilateral 3 mg Dexasone Kenalog 5 mg Xylocaine and for the right nail I did discuss continued conservative treatment for this along with continued topical and possibility for ingrown toenail removal which I educated her on today as far as the procedure and postop course

## 2019-07-09 ENCOUNTER — Telehealth: Payer: Self-pay

## 2019-07-09 NOTE — Telephone Encounter (Signed)
Patient called stating she called [place] at the end April regarding her insurance changing from [facility] to [employer]. She states she told someone that it was going to be $75 with her new insurance. Patient is wanting to see if there was any way this cost can be cut down?  Beth could you help me out with this ?  Thanks

## 2019-07-09 NOTE — Telephone Encounter (Addendum)
Called and spoke with Hilda Blades with [employer] regarding patient and change of insurance and higher copayment.  Hilda Blades will contact [place] and verify if they have updated insurance and copay amount for patient and call me back. Received information from [place] with [facility] after she contacted [facility]'s Reimbursement Specialist. Blase Mess is not contracted with [facility] so the patient will have a lower copay of $50 per month with [employer] coupon card.

## 2019-07-13 NOTE — Telephone Encounter (Signed)
Called and left message for patient to call the office.  Patient needs to have RX changed to local pharmacy and [employer] coupon card applied and copay can get lowered to $50.00 per month.

## 2019-07-16 NOTE — Telephone Encounter (Signed)
Called and left voicemail message for patient to return call to the office.  Need to inform patient of information regarding her insurance and coverage for Grastek.

## 2019-07-20 NOTE — Telephone Encounter (Addendum)
Patient returned call to the office.  Patient informed her copay will be $50.00 per month with her new insurance that started 06/30/2019 and can be sent to local pharmacy along with coupon card.  Patient states $50.00 per month will be too much for her.  She wants to know if she can take any additional medication or take an extra Zyrtec a day.  Patient wanted to know if I had checked other pharmacies for pricing and [facility] for cheaper price.  I informed patient I had checked the information regarding [place] and coverage for local pharmacy with using [employer] coupon card.  Informed patient I would check with Dr. Ernst Bowler regarding medication and also check to see how many samples of Grastek we had for patient to get.  Patient does have enough Grastek to last until the end of the month.

## 2019-07-21 NOTE — Telephone Encounter (Signed)
Yes we can do that. Otherwise we will just need to convert to allergy shots.   Salvatore Marvel, MD Allergy and [place] [facility] [place]

## 2019-07-21 NOTE — Telephone Encounter (Signed)
Patient informed and will send samples of Grastek to [place] office for patient to pick up.  Patient states she did receive notice today that her insurance was expanding coverage so hopefully she can get Grastek covered at lowered cost in the future.  Patient does not want to go on allergy injections if possible.

## 2019-07-27 DIAGNOSIS — S46002A Unspecified injury of muscle(s) and tendon(s) of the rotator cuff of left shoulder, initial encounter: Secondary | ICD-10-CM | POA: Diagnosis not present

## 2019-07-27 DIAGNOSIS — E663 Overweight: Secondary | ICD-10-CM | POA: Diagnosis not present

## 2019-07-27 DIAGNOSIS — Z1389 Encounter for screening for other disorder: Secondary | ICD-10-CM | POA: Diagnosis not present

## 2019-07-27 DIAGNOSIS — Z6826 Body mass index (BMI) 26.0-26.9, adult: Secondary | ICD-10-CM | POA: Diagnosis not present

## 2019-07-27 DIAGNOSIS — M7552 Bursitis of left shoulder: Secondary | ICD-10-CM | POA: Diagnosis not present

## 2019-07-28 ENCOUNTER — Other Ambulatory Visit (HOSPITAL_COMMUNITY): Payer: Self-pay | Admitting: Physician Assistant

## 2019-07-28 DIAGNOSIS — E2839 Other primary ovarian failure: Secondary | ICD-10-CM

## 2019-07-31 ENCOUNTER — Other Ambulatory Visit (HOSPITAL_COMMUNITY): Payer: Self-pay

## 2019-08-07 ENCOUNTER — Ambulatory Visit (HOSPITAL_COMMUNITY)
Admission: RE | Admit: 2019-08-07 | Discharge: 2019-08-07 | Disposition: A | Discharge status: Home / Self Care | Payer: PPO | Source: Ambulatory Visit | Attending: Physician Assistant | Admitting: Physician Assistant

## 2019-08-07 ENCOUNTER — Other Ambulatory Visit: Payer: Self-pay

## 2019-08-07 DIAGNOSIS — M85852 Other specified disorders of bone density and structure, left thigh: Secondary | ICD-10-CM | POA: Diagnosis not present

## 2019-08-07 DIAGNOSIS — E2839 Other primary ovarian failure: Secondary | ICD-10-CM | POA: Insufficient documentation

## 2019-08-12 DIAGNOSIS — M7552 Bursitis of left shoulder: Secondary | ICD-10-CM | POA: Diagnosis not present

## 2019-08-12 DIAGNOSIS — Z6826 Body mass index (BMI) 26.0-26.9, adult: Secondary | ICD-10-CM | POA: Diagnosis not present

## 2019-08-12 DIAGNOSIS — M8580 Other specified disorders of bone density and structure, unspecified site: Secondary | ICD-10-CM | POA: Diagnosis not present

## 2019-08-12 DIAGNOSIS — E663 Overweight: Secondary | ICD-10-CM | POA: Diagnosis not present

## 2019-08-21 NOTE — Telephone Encounter (Signed)
Patient called and states her month supply of [place] is running low and a prescription is too expensive. Patient would like to know if there is a different plan without going on allergy injections.  Please advise.

## 2019-08-21 NOTE — Telephone Encounter (Signed)
Why do not you have her make an appointment to discuss options.  The options are either medication changes or allergy shots. But she is due for a visit today.   Salvatore Marvel, MD [facility] [place]

## 2019-08-21 NOTE — Telephone Encounter (Signed)
Called and spoke with patient.  Made follow up appointment with Dr. Ernst Bowler for 09/11/19 at 3:15 pm for patient to discuss options regarding Grastek and other options.  Patient informed we have 2 samples of Grastek that will be sent to [facility] office.  I will contact the [employer] representative to see if the office can get any more samples that may help her out until she has another change of insurance in January.  Patient still does not want to do allergy injections.

## 2019-08-25 NOTE — Telephone Encounter (Signed)
Sounds good!  Thank you!  ° °Quinne Pires, MD °[facility] ° °

## 2019-08-31 NOTE — Telephone Encounter (Signed)
Contacted Sarah with [employer] and she will come to the office tomorrow to get signature from Dr. Ernst Bowler for samples of [place].

## 2019-09-08 DIAGNOSIS — M7542 Impingement syndrome of left shoulder: Secondary | ICD-10-CM | POA: Diagnosis not present

## 2019-09-08 DIAGNOSIS — M25512 Pain in left shoulder: Secondary | ICD-10-CM | POA: Diagnosis not present

## 2019-09-10 DIAGNOSIS — Z681 Body mass index (BMI) 19 or less, adult: Secondary | ICD-10-CM | POA: Diagnosis not present

## 2019-09-10 DIAGNOSIS — E7849 Other hyperlipidemia: Secondary | ICD-10-CM | POA: Diagnosis not present

## 2019-09-10 DIAGNOSIS — J321 Chronic frontal sinusitis: Secondary | ICD-10-CM | POA: Diagnosis not present

## 2019-09-11 ENCOUNTER — Other Ambulatory Visit: Payer: Self-pay

## 2019-09-11 ENCOUNTER — Encounter: Payer: Self-pay | Admitting: Allergy & Immunology

## 2019-09-11 ENCOUNTER — Ambulatory Visit: Payer: PPO | Admitting: Allergy & Immunology

## 2019-09-11 VITALS — BP 140/84 | HR 67 | Ht 68.2 in | Wt 156.0 lb

## 2019-09-11 DIAGNOSIS — R0602 Shortness of breath: Secondary | ICD-10-CM | POA: Diagnosis not present

## 2019-09-11 DIAGNOSIS — K219 Gastro-esophageal reflux disease without esophagitis: Secondary | ICD-10-CM

## 2019-09-11 DIAGNOSIS — J301 Allergic rhinitis due to pollen: Secondary | ICD-10-CM

## 2019-09-11 DIAGNOSIS — Z681 Body mass index (BMI) 19 or less, adult: Secondary | ICD-10-CM | POA: Diagnosis not present

## 2019-09-11 DIAGNOSIS — J321 Chronic frontal sinusitis: Secondary | ICD-10-CM | POA: Diagnosis not present

## 2019-09-11 DIAGNOSIS — R748 Abnormal levels of other serum enzymes: Secondary | ICD-10-CM | POA: Diagnosis not present

## 2019-09-11 MED ORDER — LEVOCETIRIZINE DIHYDROCHLORIDE 5 MG PO TABS: 5.0000 mg | ORAL_TABLET | Freq: Two times a day (BID) | ORAL | 5 refills | Status: DC

## 2019-09-11 MED ORDER — MONTELUKAST SODIUM 10 MG PO TABS: 10.0000 mg | ORAL_TABLET | Freq: Every day | ORAL | 5 refills | Status: DC

## 2019-09-11 MED ORDER — IPRATROPIUM BROMIDE 0.03 % NA SOLN: 1.0000 | Freq: Three times a day (TID) | NASAL | 5 refills | Status: DC | PRN

## 2019-09-11 NOTE — Patient Instructions (Addendum)
1. Shortness of breath with coughing - improved over the years - Lung function deferred today.  - Continue with albuterol as needed. - It is still unclear whether this is asthma.   2. Seasonal allergic rhinitis (trees, grasses and molds) - Continue Grastek samples until they are out.  - Stop the cetirizine and start Xyzal 5 mg twice daily.  - Add on Singulair (montelukast) 10 mg daily (ths works with the antihistamine).  - Start nasal ipratropium one spray per nostril every 8 hours as needed (this DRIES out the nose, so be careful).   3. GERD  - Continue with Nexium 40 mg as needed.   4. Return in about 6 months (around 03/13/2020).    Please inform us of any Emergency Department visits, hospitalizations, or changes in symptoms. Call us before going to the ED for breathing or allergy symptoms since we might be able to fit you in for a sick visit. Feel free to contact us anytime with any questions, problems, or concerns.  It was a pleasure to see you again today!  Websites that have reliable patient information: 1. American Academy of Asthma, Allergy, and Immunology: www.aaaai.org 2. Food Allergy Research and Education (FARE): foodallergy.org 3. Mothers of Asthmatics: http://www.asthmacommunitynetwork.org 4. American College of Allergy, Asthma, and Immunology: www.acaai.org   COVID-19 Vaccine Information can be found at: ShippingScam.co.uk For questions related to vaccine distribution or appointments, please email vaccine@[email or web address] .[email or web address] or call [phone].     "Like" [place] on Facebook and Instagram for our latest updates!        Make sure you are registered to vote! If you have moved or changed any of your contact information, you will need to get this updated before voting!  In some cases, you MAY be able to register to vote online: [email or web address]

## 2019-09-11 NOTE — Progress Notes (Signed)
FOLLOW UP  Date of Service/Encounter:  09/11/19   Assessment:   Shortness of breath - not responsive to asthma medications  Perennial and seasonalallergic rhinitis(grasses, trees, outdoor molds, cockroach)  Chronic nonintractable headache  Drug allergy- penicillins  Allergic contact dermatitis due to metals  Gastroesophageal reflux disease  Fibromyalgia  Plan/Recommendations:   1. Shortness of breath with coughing - improved over the years - Lung function deferred today.  - Continue with albuterol as needed. - It is still unclear whether this is asthma.   2. Seasonal allergic rhinitis (trees, grasses and molds) - Continue Grastek samples until they are out.  - Stop the cetirizine and start Xyzal 5 mg twice daily.  - Add on Singulair (montelukast) 10 mg daily (ths works with the antihistamine).  - Start nasal ipratropium one spray per nostril every 8 hours as needed (this DRIES out the nose, so be careful).   3. GERD  - Continue with Nexium 40 mg as needed.   4. Return in about 6 months (around 03/13/2020).   Subjective:   Joyce Herrera is a 59 y.o. female presenting today for follow up of  Chief Complaint  Patient presents with  . Allergic Rhinitis     burning in nose, fluid in ears  . Asthma    Joyce Herrera has a history of the following: Patient Active Problem List   Diagnosis Date Noted  . Esophageal dysphagia 05/21/2017  . Gastroesophageal reflux disease 04/23/2017  . Vaginal irritation 07/19/2016  . Urinary frequency 07/19/2016  . Pelvic relaxation due to cystocele, midline 07/19/2016  . Atrophic vaginitis 07/19/2016  . Seasonal allergic rhinitis due to pollen 07/17/2016  . Moderate persistent asthma, uncomplicated 67/34/1939  . Drug allergy 07/17/2016  . Chronic nonintractable headache 07/17/2016  . Allergic contact dermatitis due to metals 07/17/2016  . Class 1 obesity due to excess calories without serious comorbidity with body  mass index (BMI) of 30.0 to 30.9 in adult 03/14/2016  . Family hx of colon cancer 09/16/2015  . History of colonic polyps 09/16/2015  . Cough 09/15/2015  . Laryngopharyngeal reflux (LPR) 09/15/2015  . Subclinical hyperthyroidism 08/24/2015  . Hyperlipidemia 08/24/2015    History obtained from: chart review and patient.  Joyce Herrera is a 59 y.o. female presenting for a follow up visit. She was last seen in August 2020 via a televisit. At that time, we continued with albuterol as needed. We still were unsure whether this was asthma at all. For her rhinitis, we continued to [facility] as well as cetirizine. We also discussed allergen immunotherapy.   Since the last visit, she has mostly done well. Unfortunately, we had to change her to [place] when Jeanella Cara became too expensive. In total, she has been on it for around 18 months.  Asthma/Respiratory Symptom History: The cough has improved a lot since we started seeing her. Whatever happened, her cough has improved since we last saw her. She did change her box springs and mattress. She did some other environmental changes. She has not needed her albuterol in several months.   Allergic Rhinitis Symptom History: The majority of her symptoms are in the middle of the summer. She reports headache and she feels like her eyes have been on fire. She feels that something is running out of her ear. She is having discharge from the postnasal drip. She was on Flonase daily, but her GI doctor stopped it for unclear reasons. She is not interested in allergen immunotherapy because she was on it for  several years in the distant past and following up was difficult.   Otherwise, there have been no changes to her past medical history, surgical history, family history, or social history.    Review of Systems  Constitutional: Negative.  Negative for chills, fever, malaise/fatigue and weight loss.  HENT: Positive for congestion. Negative for ear discharge, ear pain and sinus  pain.   Eyes: Negative for pain, discharge and redness.  Respiratory: Negative for cough, sputum production, shortness of breath and wheezing.   Cardiovascular: Negative.  Negative for chest pain and palpitations.  Gastrointestinal: Negative for abdominal pain, heartburn, nausea and vomiting.  Skin: Negative.  Negative for itching and rash.  Neurological: Negative for dizziness and headaches.  Endo/Heme/Allergies: Negative for environmental allergies. Does not bruise/bleed easily.       Objective:   Blood pressure 140/84, pulse 67, height 5' 8.2" (1.732 m), weight 156 lb (70.8 kg), last menstrual period 01/30/1992, SpO2 97 %. Body mass index is 23.58 kg/m.   Physical Exam:  Physical Exam Constitutional:      Appearance: She is well-developed.     Comments: Pleasant female.   HENT:     Head: Normocephalic and atraumatic.     Right Ear: Tympanic membrane, ear canal and external ear normal.     Left Ear: Tympanic membrane, ear canal and external ear normal.     Nose: No nasal deformity, septal deviation, mucosal edema or rhinorrhea.     Right Turbinates: Enlarged, swollen and pale.     Left Turbinates: Enlarged, swollen and pale.     Right Sinus: No maxillary sinus tenderness or frontal sinus tenderness.     Left Sinus: No maxillary sinus tenderness or frontal sinus tenderness.     Comments: Cobblestoning present in the posterior oropharynx.     Mouth/Throat:     Mouth: Mucous membranes are not pale and not dry.     Pharynx: Uvula midline.  Eyes:     General:        Right eye: No discharge.        Left eye: No discharge.     Conjunctiva/sclera: Conjunctivae normal.     Right eye: Right conjunctiva is not injected. No chemosis.    Left eye: Left conjunctiva is not injected. No chemosis.    Pupils: Pupils are equal, round, and reactive to light.  Cardiovascular:     Rate and Rhythm: Normal rate and regular rhythm.     Heart sounds: Normal heart sounds.  Pulmonary:      Effort: Pulmonary effort is normal. No tachypnea, accessory muscle usage or respiratory distress.     Breath sounds: Normal breath sounds. No wheezing, rhonchi or rales.     Comments: Moving air well in all lung fields. No increased work of breathing noted.  Chest:     Chest wall: No tenderness.  Lymphadenopathy:     Cervical: No cervical adenopathy.  Skin:    Coloration: Skin is not pale.     Findings: No abrasion, erythema, petechiae or rash. Rash is not papular, urticarial or vesicular.  Neurological:     Mental Status: She is alert.  Psychiatric:        Behavior: Behavior is cooperative.      Diagnostic studies: none    Salvatore Marvel, MD  [facility] [place] [facility] [place]

## 2019-09-13 ENCOUNTER — Encounter: Payer: Self-pay | Admitting: Allergy & Immunology

## 2019-09-15 DIAGNOSIS — M6281 Muscle weakness (generalized): Secondary | ICD-10-CM | POA: Diagnosis not present

## 2019-09-15 DIAGNOSIS — M799 Soft tissue disorder, unspecified: Secondary | ICD-10-CM | POA: Diagnosis not present

## 2019-09-15 DIAGNOSIS — M25512 Pain in left shoulder: Secondary | ICD-10-CM | POA: Diagnosis not present

## 2019-09-15 DIAGNOSIS — M25612 Stiffness of left shoulder, not elsewhere classified: Secondary | ICD-10-CM | POA: Diagnosis not present

## 2019-09-22 DIAGNOSIS — M25512 Pain in left shoulder: Secondary | ICD-10-CM | POA: Diagnosis not present

## 2019-09-22 DIAGNOSIS — M799 Soft tissue disorder, unspecified: Secondary | ICD-10-CM | POA: Diagnosis not present

## 2019-09-22 DIAGNOSIS — M25612 Stiffness of left shoulder, not elsewhere classified: Secondary | ICD-10-CM | POA: Diagnosis not present

## 2019-09-22 DIAGNOSIS — M6281 Muscle weakness (generalized): Secondary | ICD-10-CM | POA: Diagnosis not present

## 2019-09-24 DIAGNOSIS — M25512 Pain in left shoulder: Secondary | ICD-10-CM | POA: Diagnosis not present

## 2019-09-24 DIAGNOSIS — M799 Soft tissue disorder, unspecified: Secondary | ICD-10-CM | POA: Diagnosis not present

## 2019-09-24 DIAGNOSIS — M25612 Stiffness of left shoulder, not elsewhere classified: Secondary | ICD-10-CM | POA: Diagnosis not present

## 2019-09-24 DIAGNOSIS — M6281 Muscle weakness (generalized): Secondary | ICD-10-CM | POA: Diagnosis not present

## 2019-09-29 DIAGNOSIS — M25512 Pain in left shoulder: Secondary | ICD-10-CM | POA: Diagnosis not present

## 2019-09-29 DIAGNOSIS — M6281 Muscle weakness (generalized): Secondary | ICD-10-CM | POA: Diagnosis not present

## 2019-09-29 DIAGNOSIS — M799 Soft tissue disorder, unspecified: Secondary | ICD-10-CM | POA: Diagnosis not present

## 2019-09-29 DIAGNOSIS — M25612 Stiffness of left shoulder, not elsewhere classified: Secondary | ICD-10-CM | POA: Diagnosis not present

## 2019-09-30 ENCOUNTER — Encounter (HOSPITAL_COMMUNITY)
Admission: RE | Admit: 2019-09-30 | Discharge: 2019-09-30 | Disposition: A | Discharge status: Home / Self Care | Payer: PPO | Source: Ambulatory Visit | Attending: Orthopedic Surgery | Admitting: Orthopedic Surgery

## 2019-09-30 ENCOUNTER — Other Ambulatory Visit: Payer: Self-pay

## 2019-09-30 DIAGNOSIS — M25562 Pain in left knee: Secondary | ICD-10-CM | POA: Diagnosis not present

## 2019-09-30 DIAGNOSIS — Z96652 Presence of left artificial knee joint: Secondary | ICD-10-CM

## 2019-09-30 MED ORDER — TECHNETIUM TC 99M MEDRONATE IV KIT
20.0000 | PACK | Freq: Once | INTRAVENOUS | Status: AC | PRN
  Administered 2019-09-30: 22.2 via INTRAVENOUS

## 2019-10-01 DIAGNOSIS — M25612 Stiffness of left shoulder, not elsewhere classified: Secondary | ICD-10-CM | POA: Diagnosis not present

## 2019-10-01 DIAGNOSIS — M6281 Muscle weakness (generalized): Secondary | ICD-10-CM | POA: Diagnosis not present

## 2019-10-01 DIAGNOSIS — M799 Soft tissue disorder, unspecified: Secondary | ICD-10-CM | POA: Diagnosis not present

## 2019-10-01 DIAGNOSIS — M25512 Pain in left shoulder: Secondary | ICD-10-CM | POA: Diagnosis not present

## 2019-10-06 DIAGNOSIS — M799 Soft tissue disorder, unspecified: Secondary | ICD-10-CM | POA: Diagnosis not present

## 2019-10-06 DIAGNOSIS — M6281 Muscle weakness (generalized): Secondary | ICD-10-CM | POA: Diagnosis not present

## 2019-10-06 DIAGNOSIS — M25612 Stiffness of left shoulder, not elsewhere classified: Secondary | ICD-10-CM | POA: Diagnosis not present

## 2019-10-06 DIAGNOSIS — M25512 Pain in left shoulder: Secondary | ICD-10-CM | POA: Diagnosis not present

## 2019-10-07 DIAGNOSIS — Z96652 Presence of left artificial knee joint: Secondary | ICD-10-CM | POA: Diagnosis not present

## 2019-10-07 DIAGNOSIS — M1711 Unilateral primary osteoarthritis, right knee: Secondary | ICD-10-CM | POA: Diagnosis not present

## 2019-10-07 DIAGNOSIS — M25561 Pain in right knee: Secondary | ICD-10-CM | POA: Diagnosis not present

## 2019-10-07 DIAGNOSIS — M25562 Pain in left knee: Secondary | ICD-10-CM | POA: Diagnosis not present

## 2019-10-08 DIAGNOSIS — M799 Soft tissue disorder, unspecified: Secondary | ICD-10-CM | POA: Diagnosis not present

## 2019-10-08 DIAGNOSIS — M25512 Pain in left shoulder: Secondary | ICD-10-CM | POA: Diagnosis not present

## 2019-10-08 DIAGNOSIS — M25612 Stiffness of left shoulder, not elsewhere classified: Secondary | ICD-10-CM | POA: Diagnosis not present

## 2019-10-08 DIAGNOSIS — M6281 Muscle weakness (generalized): Secondary | ICD-10-CM | POA: Diagnosis not present

## 2019-10-12 DIAGNOSIS — M6281 Muscle weakness (generalized): Secondary | ICD-10-CM | POA: Diagnosis not present

## 2019-10-12 DIAGNOSIS — M799 Soft tissue disorder, unspecified: Secondary | ICD-10-CM | POA: Diagnosis not present

## 2019-10-12 DIAGNOSIS — M25612 Stiffness of left shoulder, not elsewhere classified: Secondary | ICD-10-CM | POA: Diagnosis not present

## 2019-10-12 DIAGNOSIS — M25512 Pain in left shoulder: Secondary | ICD-10-CM | POA: Diagnosis not present

## 2019-10-15 DIAGNOSIS — M799 Soft tissue disorder, unspecified: Secondary | ICD-10-CM | POA: Diagnosis not present

## 2019-10-15 DIAGNOSIS — M6281 Muscle weakness (generalized): Secondary | ICD-10-CM | POA: Diagnosis not present

## 2019-10-15 DIAGNOSIS — M25512 Pain in left shoulder: Secondary | ICD-10-CM | POA: Diagnosis not present

## 2019-10-15 DIAGNOSIS — M25612 Stiffness of left shoulder, not elsewhere classified: Secondary | ICD-10-CM | POA: Diagnosis not present

## 2019-10-19 DIAGNOSIS — M25612 Stiffness of left shoulder, not elsewhere classified: Secondary | ICD-10-CM | POA: Diagnosis not present

## 2019-10-19 DIAGNOSIS — M25512 Pain in left shoulder: Secondary | ICD-10-CM | POA: Diagnosis not present

## 2019-10-19 DIAGNOSIS — M799 Soft tissue disorder, unspecified: Secondary | ICD-10-CM | POA: Diagnosis not present

## 2019-10-19 DIAGNOSIS — M6281 Muscle weakness (generalized): Secondary | ICD-10-CM | POA: Diagnosis not present

## 2019-10-21 ENCOUNTER — Telehealth: Payer: Self-pay | Admitting: Podiatry

## 2019-10-21 NOTE — Telephone Encounter (Signed)
We are currently providing PT on patient for her shoulder. She stated she recently saw Dr. Paulla Dolly for plantar fasciitis and we are trying to see if we can get orders to provide PT for her plantar fasciitis. If you have any questions, you can call me at [phone] and our fax number is [phone].

## 2019-10-21 NOTE — Telephone Encounter (Signed)
Dr. Paulla Dolly- are you OK with me doing this prescription?

## 2019-10-22 DIAGNOSIS — M25612 Stiffness of left shoulder, not elsewhere classified: Secondary | ICD-10-CM | POA: Diagnosis not present

## 2019-10-22 DIAGNOSIS — M25512 Pain in left shoulder: Secondary | ICD-10-CM | POA: Diagnosis not present

## 2019-10-22 DIAGNOSIS — M6281 Muscle weakness (generalized): Secondary | ICD-10-CM | POA: Diagnosis not present

## 2019-10-22 DIAGNOSIS — M799 Soft tissue disorder, unspecified: Secondary | ICD-10-CM | POA: Diagnosis not present

## 2019-10-26 NOTE — Telephone Encounter (Signed)
No problem with this patient adding pt for her fasciitis along with her shoulder

## 2019-10-27 ENCOUNTER — Other Ambulatory Visit: Payer: Self-pay

## 2019-10-27 DIAGNOSIS — M722 Plantar fascial fibromatosis: Secondary | ICD-10-CM

## 2019-10-28 DIAGNOSIS — M799 Soft tissue disorder, unspecified: Secondary | ICD-10-CM | POA: Diagnosis not present

## 2019-10-28 DIAGNOSIS — M25612 Stiffness of left shoulder, not elsewhere classified: Secondary | ICD-10-CM | POA: Diagnosis not present

## 2019-10-28 DIAGNOSIS — M6281 Muscle weakness (generalized): Secondary | ICD-10-CM | POA: Diagnosis not present

## 2019-10-28 DIAGNOSIS — M25512 Pain in left shoulder: Secondary | ICD-10-CM | POA: Diagnosis not present

## 2019-10-30 DIAGNOSIS — M799 Soft tissue disorder, unspecified: Secondary | ICD-10-CM | POA: Diagnosis not present

## 2019-10-30 DIAGNOSIS — M25512 Pain in left shoulder: Secondary | ICD-10-CM | POA: Diagnosis not present

## 2019-10-30 DIAGNOSIS — M6281 Muscle weakness (generalized): Secondary | ICD-10-CM | POA: Diagnosis not present

## 2019-10-30 DIAGNOSIS — M25612 Stiffness of left shoulder, not elsewhere classified: Secondary | ICD-10-CM | POA: Diagnosis not present

## 2019-11-03 DIAGNOSIS — M25512 Pain in left shoulder: Secondary | ICD-10-CM | POA: Diagnosis not present

## 2019-11-03 DIAGNOSIS — M799 Soft tissue disorder, unspecified: Secondary | ICD-10-CM | POA: Diagnosis not present

## 2019-11-03 DIAGNOSIS — M6281 Muscle weakness (generalized): Secondary | ICD-10-CM | POA: Diagnosis not present

## 2019-11-03 DIAGNOSIS — M25612 Stiffness of left shoulder, not elsewhere classified: Secondary | ICD-10-CM | POA: Diagnosis not present

## 2019-11-05 DIAGNOSIS — Z23 Encounter for immunization: Secondary | ICD-10-CM | POA: Diagnosis not present

## 2019-11-06 DIAGNOSIS — M25612 Stiffness of left shoulder, not elsewhere classified: Secondary | ICD-10-CM | POA: Diagnosis not present

## 2019-11-06 DIAGNOSIS — M799 Soft tissue disorder, unspecified: Secondary | ICD-10-CM | POA: Diagnosis not present

## 2019-11-06 DIAGNOSIS — M25512 Pain in left shoulder: Secondary | ICD-10-CM | POA: Diagnosis not present

## 2019-11-06 DIAGNOSIS — M6281 Muscle weakness (generalized): Secondary | ICD-10-CM | POA: Diagnosis not present

## 2019-11-10 DIAGNOSIS — M25512 Pain in left shoulder: Secondary | ICD-10-CM | POA: Diagnosis not present

## 2019-11-10 DIAGNOSIS — M25612 Stiffness of left shoulder, not elsewhere classified: Secondary | ICD-10-CM | POA: Diagnosis not present

## 2019-11-10 DIAGNOSIS — M6281 Muscle weakness (generalized): Secondary | ICD-10-CM | POA: Diagnosis not present

## 2019-11-10 DIAGNOSIS — M799 Soft tissue disorder, unspecified: Secondary | ICD-10-CM | POA: Diagnosis not present

## 2019-11-12 DIAGNOSIS — M722 Plantar fascial fibromatosis: Secondary | ICD-10-CM | POA: Diagnosis not present

## 2019-11-12 DIAGNOSIS — M799 Soft tissue disorder, unspecified: Secondary | ICD-10-CM | POA: Diagnosis not present

## 2019-11-12 DIAGNOSIS — R2689 Other abnormalities of gait and mobility: Secondary | ICD-10-CM | POA: Diagnosis not present

## 2019-11-12 DIAGNOSIS — M25512 Pain in left shoulder: Secondary | ICD-10-CM | POA: Diagnosis not present

## 2019-11-12 DIAGNOSIS — M6281 Muscle weakness (generalized): Secondary | ICD-10-CM | POA: Diagnosis not present

## 2019-11-12 DIAGNOSIS — M25572 Pain in left ankle and joints of left foot: Secondary | ICD-10-CM | POA: Diagnosis not present

## 2019-11-12 DIAGNOSIS — M25571 Pain in right ankle and joints of right foot: Secondary | ICD-10-CM | POA: Diagnosis not present

## 2019-11-12 DIAGNOSIS — M25612 Stiffness of left shoulder, not elsewhere classified: Secondary | ICD-10-CM | POA: Diagnosis not present

## 2019-11-16 DIAGNOSIS — R2689 Other abnormalities of gait and mobility: Secondary | ICD-10-CM | POA: Diagnosis not present

## 2019-11-16 DIAGNOSIS — M722 Plantar fascial fibromatosis: Secondary | ICD-10-CM | POA: Diagnosis not present

## 2019-11-16 DIAGNOSIS — M25571 Pain in right ankle and joints of right foot: Secondary | ICD-10-CM | POA: Diagnosis not present

## 2019-11-16 DIAGNOSIS — M25572 Pain in left ankle and joints of left foot: Secondary | ICD-10-CM | POA: Diagnosis not present

## 2019-11-18 DIAGNOSIS — M722 Plantar fascial fibromatosis: Secondary | ICD-10-CM | POA: Diagnosis not present

## 2019-11-18 DIAGNOSIS — R2689 Other abnormalities of gait and mobility: Secondary | ICD-10-CM | POA: Diagnosis not present

## 2019-11-18 DIAGNOSIS — M25571 Pain in right ankle and joints of right foot: Secondary | ICD-10-CM | POA: Diagnosis not present

## 2019-11-18 DIAGNOSIS — M25572 Pain in left ankle and joints of left foot: Secondary | ICD-10-CM | POA: Diagnosis not present

## 2019-11-19 DIAGNOSIS — M25612 Stiffness of left shoulder, not elsewhere classified: Secondary | ICD-10-CM | POA: Diagnosis not present

## 2019-11-19 DIAGNOSIS — M6281 Muscle weakness (generalized): Secondary | ICD-10-CM | POA: Diagnosis not present

## 2019-11-19 DIAGNOSIS — M25512 Pain in left shoulder: Secondary | ICD-10-CM | POA: Diagnosis not present

## 2019-11-19 DIAGNOSIS — M799 Soft tissue disorder, unspecified: Secondary | ICD-10-CM | POA: Diagnosis not present

## 2019-11-23 DIAGNOSIS — M25572 Pain in left ankle and joints of left foot: Secondary | ICD-10-CM | POA: Diagnosis not present

## 2019-11-23 DIAGNOSIS — M25571 Pain in right ankle and joints of right foot: Secondary | ICD-10-CM | POA: Diagnosis not present

## 2019-11-23 DIAGNOSIS — M722 Plantar fascial fibromatosis: Secondary | ICD-10-CM | POA: Diagnosis not present

## 2019-11-23 DIAGNOSIS — R2689 Other abnormalities of gait and mobility: Secondary | ICD-10-CM | POA: Diagnosis not present

## 2019-11-25 DIAGNOSIS — M722 Plantar fascial fibromatosis: Secondary | ICD-10-CM | POA: Diagnosis not present

## 2019-11-25 DIAGNOSIS — M25572 Pain in left ankle and joints of left foot: Secondary | ICD-10-CM | POA: Diagnosis not present

## 2019-11-25 DIAGNOSIS — M25571 Pain in right ankle and joints of right foot: Secondary | ICD-10-CM | POA: Diagnosis not present

## 2019-11-25 DIAGNOSIS — R2689 Other abnormalities of gait and mobility: Secondary | ICD-10-CM | POA: Diagnosis not present

## 2019-11-27 DIAGNOSIS — M25572 Pain in left ankle and joints of left foot: Secondary | ICD-10-CM | POA: Diagnosis not present

## 2019-11-27 DIAGNOSIS — M25571 Pain in right ankle and joints of right foot: Secondary | ICD-10-CM | POA: Diagnosis not present

## 2019-11-27 DIAGNOSIS — R2689 Other abnormalities of gait and mobility: Secondary | ICD-10-CM | POA: Diagnosis not present

## 2019-11-27 DIAGNOSIS — M722 Plantar fascial fibromatosis: Secondary | ICD-10-CM | POA: Diagnosis not present

## 2019-11-30 DIAGNOSIS — M25571 Pain in right ankle and joints of right foot: Secondary | ICD-10-CM | POA: Diagnosis not present

## 2019-11-30 DIAGNOSIS — M722 Plantar fascial fibromatosis: Secondary | ICD-10-CM | POA: Diagnosis not present

## 2019-11-30 DIAGNOSIS — R2689 Other abnormalities of gait and mobility: Secondary | ICD-10-CM | POA: Diagnosis not present

## 2019-11-30 DIAGNOSIS — M25572 Pain in left ankle and joints of left foot: Secondary | ICD-10-CM | POA: Diagnosis not present

## 2019-12-04 DIAGNOSIS — M25571 Pain in right ankle and joints of right foot: Secondary | ICD-10-CM | POA: Diagnosis not present

## 2019-12-04 DIAGNOSIS — R2689 Other abnormalities of gait and mobility: Secondary | ICD-10-CM | POA: Diagnosis not present

## 2019-12-04 DIAGNOSIS — M722 Plantar fascial fibromatosis: Secondary | ICD-10-CM | POA: Diagnosis not present

## 2019-12-04 DIAGNOSIS — M25572 Pain in left ankle and joints of left foot: Secondary | ICD-10-CM | POA: Diagnosis not present

## 2019-12-14 ENCOUNTER — Encounter: Payer: Self-pay | Admitting: Podiatry

## 2019-12-14 ENCOUNTER — Other Ambulatory Visit: Payer: Self-pay

## 2019-12-14 ENCOUNTER — Ambulatory Visit: Payer: PPO | Admitting: Podiatry

## 2019-12-14 DIAGNOSIS — M722 Plantar fascial fibromatosis: Secondary | ICD-10-CM

## 2019-12-14 DIAGNOSIS — B351 Tinea unguium: Secondary | ICD-10-CM | POA: Diagnosis not present

## 2019-12-14 NOTE — Progress Notes (Signed)
Subjective:   Patient ID: Joyce Herrera, female   DOB: 59 y.o.   MRN: [id number]   HPI Patient presents stating she is getting pain in both of her heels that is been ongoing and states that it is started the last couple weeks and seemed to do well until then   ROS      Objective:  Physical Exam  Ocular status intact with patient found to have exquisite discomfort in the heel region bilateral at the insertion calcaneus     Assessment:  Acute plantar fasciitis bilateral with inflammation fluid buildup     Plan:  H&P reviewed condition sterile prep done and injected the plantar fascia bilateral 3 mg Kenalog 5 mg Xylocaine and instructed on anti-inflammatories and continued orthotic usage.  Reappoint to recheck

## 2020-03-21 ENCOUNTER — Encounter: Payer: Self-pay | Admitting: Podiatry

## 2020-03-21 ENCOUNTER — Ambulatory Visit: Payer: PPO | Admitting: Podiatry

## 2020-03-21 ENCOUNTER — Other Ambulatory Visit: Payer: Self-pay

## 2020-03-21 DIAGNOSIS — M722 Plantar fascial fibromatosis: Secondary | ICD-10-CM | POA: Diagnosis not present

## 2020-03-21 MED ORDER — TRIAMCINOLONE ACETONIDE 10 MG/ML IJ SUSP
10.0000 mg | Freq: Once | INTRAMUSCULAR | Status: AC
  Administered 2020-03-21: 10 mg

## 2020-03-21 MED ORDER — TRIAMCINOLONE ACETONIDE 10 MG/ML IJ SUSP: 10.0000 mg | Freq: Once | INTRAMUSCULAR | Status: DC

## 2020-03-22 NOTE — Progress Notes (Signed)
Subjective:   Patient ID: Joyce Herrera, female   DOB: 60 y.o.   MRN: [id number]   HPI Patient presents stating that her heels are bothering her clothes are feels like she cannot flex them properly   ROS      Objective:  Physical Exam  Neurovascular status intact with patient found to have discomfort in the plantar heel bilateral extending into the arch bilateral     Assessment:  Planter fasciitis chronic in nature bilateral with inability for her to stretch her foot properly       Plan:  Reviewed condition and today I dispensed a night splint but I want her to start using with education given today and I did sterile prep and injected the plantar fascial bilateral 3 mg Dexasone Kenalog 5 mg Xylocaine

## 2020-03-28 ENCOUNTER — Other Ambulatory Visit: Payer: Self-pay | Admitting: Allergy & Immunology

## 2020-03-28 NOTE — Telephone Encounter (Signed)
Levocetirizine and Montelukast was sent into [place] in [place] for a courtesy refill stating patient needs appointment

## 2020-04-21 DIAGNOSIS — K219 Gastro-esophageal reflux disease without esophagitis: Secondary | ICD-10-CM | POA: Diagnosis not present

## 2020-04-21 DIAGNOSIS — I10 Essential (primary) hypertension: Secondary | ICD-10-CM | POA: Diagnosis not present

## 2020-04-21 DIAGNOSIS — Z0000 Encounter for general adult medical examination without abnormal findings: Secondary | ICD-10-CM | POA: Diagnosis not present

## 2020-04-21 DIAGNOSIS — Z6831 Body mass index (BMI) 31.0-31.9, adult: Secondary | ICD-10-CM | POA: Diagnosis not present

## 2020-04-21 DIAGNOSIS — G894 Chronic pain syndrome: Secondary | ICD-10-CM | POA: Diagnosis not present

## 2020-04-21 DIAGNOSIS — M1991 Primary osteoarthritis, unspecified site: Secondary | ICD-10-CM | POA: Diagnosis not present

## 2020-04-21 DIAGNOSIS — E7849 Other hyperlipidemia: Secondary | ICD-10-CM | POA: Diagnosis not present

## 2020-04-22 DIAGNOSIS — Z1389 Encounter for screening for other disorder: Secondary | ICD-10-CM | POA: Diagnosis not present

## 2020-04-22 DIAGNOSIS — E7849 Other hyperlipidemia: Secondary | ICD-10-CM | POA: Diagnosis not present

## 2020-04-22 DIAGNOSIS — I10 Essential (primary) hypertension: Secondary | ICD-10-CM | POA: Diagnosis not present

## 2020-04-22 DIAGNOSIS — Z0000 Encounter for general adult medical examination without abnormal findings: Secondary | ICD-10-CM | POA: Diagnosis not present

## 2020-05-05 DIAGNOSIS — M1711 Unilateral primary osteoarthritis, right knee: Secondary | ICD-10-CM | POA: Diagnosis not present

## 2020-05-05 DIAGNOSIS — M25562 Pain in left knee: Secondary | ICD-10-CM | POA: Diagnosis not present

## 2020-05-09 ENCOUNTER — Other Ambulatory Visit: Payer: Self-pay | Admitting: Allergy & Immunology

## 2020-05-10 ENCOUNTER — Other Ambulatory Visit (HOSPITAL_COMMUNITY): Payer: Self-pay | Admitting: Orthopedic Surgery

## 2020-05-10 DIAGNOSIS — Z96652 Presence of left artificial knee joint: Secondary | ICD-10-CM

## 2020-05-10 DIAGNOSIS — T84033A Mechanical loosening of internal left knee prosthetic joint, initial encounter: Secondary | ICD-10-CM

## 2020-05-19 ENCOUNTER — Encounter (HOSPITAL_COMMUNITY): Payer: Self-pay

## 2020-05-19 NOTE — Progress Notes (Signed)
Received self-referral for initial lung cancer screening scan. Contacted patient and obtained smoking history. Patient states that she quit smoking in 2004 or 2005. I explain that per guidelines, she would've had to have smoked within the past 15 years to be considered at risk and qualify for lung cancer screening. Patient is relieved and appreciative of my call.

## 2020-06-03 DIAGNOSIS — Z96652 Presence of left artificial knee joint: Secondary | ICD-10-CM | POA: Diagnosis not present

## 2020-06-03 DIAGNOSIS — M1711 Unilateral primary osteoarthritis, right knee: Secondary | ICD-10-CM | POA: Diagnosis not present

## 2020-07-07 ENCOUNTER — Other Ambulatory Visit: Payer: Self-pay

## 2020-07-07 ENCOUNTER — Other Ambulatory Visit (HOSPITAL_COMMUNITY): Payer: Self-pay | Admitting: Family Medicine

## 2020-07-07 ENCOUNTER — Ambulatory Visit (HOSPITAL_COMMUNITY)
Admission: RE | Admit: 2020-07-07 | Discharge: 2020-07-07 | Disposition: A | Discharge status: Home / Self Care | Payer: PPO | Source: Ambulatory Visit | Attending: Family Medicine | Admitting: Family Medicine

## 2020-07-07 DIAGNOSIS — E663 Overweight: Secondary | ICD-10-CM | POA: Diagnosis not present

## 2020-07-07 DIAGNOSIS — S99911A Unspecified injury of right ankle, initial encounter: Secondary | ICD-10-CM | POA: Diagnosis not present

## 2020-07-07 DIAGNOSIS — M21962 Unspecified acquired deformity of left lower leg: Secondary | ICD-10-CM

## 2020-07-07 DIAGNOSIS — M21961 Unspecified acquired deformity of right lower leg: Secondary | ICD-10-CM | POA: Diagnosis not present

## 2020-07-07 DIAGNOSIS — Z6828 Body mass index (BMI) 28.0-28.9, adult: Secondary | ICD-10-CM | POA: Diagnosis not present

## 2020-07-07 DIAGNOSIS — J069 Acute upper respiratory infection, unspecified: Secondary | ICD-10-CM | POA: Diagnosis not present

## 2020-07-07 DIAGNOSIS — E7849 Other hyperlipidemia: Secondary | ICD-10-CM | POA: Diagnosis not present

## 2020-07-08 DIAGNOSIS — E7849 Other hyperlipidemia: Secondary | ICD-10-CM | POA: Diagnosis not present

## 2020-07-08 DIAGNOSIS — E785 Hyperlipidemia, unspecified: Secondary | ICD-10-CM | POA: Diagnosis not present

## 2020-07-14 ENCOUNTER — Ambulatory Visit: Payer: PPO | Admitting: Podiatry

## 2020-07-14 ENCOUNTER — Other Ambulatory Visit: Payer: Self-pay

## 2020-07-14 ENCOUNTER — Ambulatory Visit: Payer: PPO | Admitting: Orthopedic Surgery

## 2020-07-14 VITALS — BP 149/107 | HR 94 | Ht 68.0 in | Wt 174.0 lb

## 2020-07-14 DIAGNOSIS — S8261XA Displaced fracture of lateral malleolus of right fibula, initial encounter for closed fracture: Secondary | ICD-10-CM | POA: Diagnosis not present

## 2020-07-14 NOTE — Patient Instructions (Signed)
Ankle sleeve as needed

## 2020-07-14 NOTE — Progress Notes (Signed)
NEW PROBLEM//OFFICE VISIT  Summary assessment and plan:   Recommend ankle support as needed follow-up if no improvement  Chief Complaint  Patient presents with   Ankle Injury    Twisted 06/28/20  right ankle    60 year old female twisted her right ankle on May 31 walked around on it for several days.  When she was at a routine visit with her primary care doctor she asked him to look at her ankle because it was sore and swollen x-ray was taken and she has an avulsion fracture of the distal portion of the lateral malleolus  She went to [facility] and got a wrap on ankle sleeve but she has been weightbearing since the injury    [place].  Encounter Diagnosis  Name Primary?   Closed avulsion fracture of lateral malleolus of right fibula, initial encounter Yes    B. DATA ANALYSED:   IMAGING: Interpretation of images: Ankle images from June 9 shows an avulsion fracture with an intact ankle mortise the avulsion is from the lateral malleolus  Orders: None  Outside records reviewed: No   C. MANAGEMENT   Weight-bear as tolerated ankle sleeve as needed follow-up as needed  No orders of the defined types were placed in this encounter.    BP (!) 149/107   Pulse 94   Ht 5\' 8"  (1.727 m)   Wt 174 lb (78.9 kg)   LMP 01/30/1992   BMI 26.46 kg/m    General appearance: Well-developed well-nourished no gross deformities  Cardiovascular normal pulse and perfusion normal color without edema  Neurologically no sensation loss or deficits or pathologic reflexes  Psychological: Awake alert and oriented x3 mood and affect normal  Skin no lacerations or ulcerations no nodularity no palpable masses, no erythema or nodularity  Musculoskeletal:   Although she still walking gingerly she is walking without support she has tenderness at the tip of the fibula of the right ankle she has excellent range of motion minimal swelling no skin changes stable drawer  test   ROS  No numbness or tingling no skin lesions or breaks   Past Medical History:  Diagnosis Date   Asthma    Headache(784.0)    History of colon polyps    Hypothyroidism    PONV (postoperative nausea and vomiting)    Seasonal allergies     Past Surgical History:  Procedure Laterality Date   ABDOMINAL HYSTERECTOMY     BIOPSY  10/23/2017   Procedure: BIOPSY;  Surgeon: Rogene Houston, MD;  [facility]: [facility];  Service: Endoscopy;;  gastric   CHOLECYSTECTOMY     COLONOSCOPY  09/14/2010   Procedure: COLONOSCOPY;  Surgeon: Rogene Houston, MD;  Location: [facility];  Service: Endoscopy;  Laterality: N/A;   COLONOSCOPY N/A 01/11/2016   Procedure: COLONOSCOPY;  Surgeon: Rogene Houston, MD;  [facility]: [facility];  Service: Endoscopy;  Laterality: N/A;  930   ESOPHAGEAL DILATION N/A 10/23/2017   Procedure: ESOPHAGEAL DILATION;  Surgeon: Rogene Houston, MD;  Location: [facility];  Service: Endoscopy;  Laterality: N/A;   ESOPHAGOGASTRODUODENOSCOPY N/A 10/23/2017   Procedure: ESOPHAGOGASTRODUODENOSCOPY (EGD);  Surgeon: Rogene Houston, MD;  [facility];  Service: Endoscopy;  Laterality: N/A;  11:25-rescheduled to 9/25 @ 8:30am per Lelon Frohlich   MEDIAL PARTIAL KNEE REPLACEMENT Left    NASAL SEPTUM SURGERY     PLANTAR FASCIECTOMY     bilateral   POLYPECTOMY  01/11/2016   Procedure: POLYPECTOMY;  Surgeon: Rogene Houston, MD;  [facility]: [facility];  Service: Endoscopy;;  multiple colon polyps    TONSILLECTOMY      Family History  Problem Relation Age of Onset   Lung cancer Mother    Kidney cancer Mother    Colon cancer Father    Prostate cancer Father    Diabetes Father    Hypertension Father    Kidney failure Father    Social History   Tobacco Use   Smoking status: Former    Pack years: 0.00   Smokeless tobacco: Never   Tobacco comments:    quit October 2004  Vaping Use   Vaping Use: Never used  Substance Use Topics   Alcohol use: No   Drug  use: No    Allergies  Allergen Reactions   Cephalexin Other (See Comments)    Pt stated, "Ears started to itch; hands and feet were itching"   Penicillins Rash    Has patient had a PCN reaction causing immediate rash, facial/tongue/throat swelling, SOB or lightheadedness with hypotension: No Has patient had a PCN reaction causing severe rash involving mucus membranes or skin necrosis: No Has patient had a PCN reaction that required hospitalization: No Has patient had a PCN reaction occurring within the last 10 years: No If all of the above answers are "NO", then may proceed with Cephalosporin use.     Current Meds  Medication Sig   ALPRAZolam (XANAX) 0.5 MG tablet Take 0.25 mg by mouth daily as needed for anxiety.    calcium-vitamin D (OSCAL WITH D) 500-200 MG-UNIT tablet Take 1 tablet by mouth daily with breakfast.   doxycycline (VIBRA-TABS) 100 MG tablet Take 100 mg by mouth 2 (two) times daily.   flurbiprofen (ANSAID) 100 MG tablet Take 100 mg by mouth daily as needed (headache).    ipratropium (ATROVENT) 0.03 % nasal spray Place 1 spray into both nostrils every 8 (eight) hours as needed for rhinitis.   levocetirizine (XYZAL) 5 MG tablet TAKE (1) TABLET BY MOUTH TWICE DAILY.   montelukast (SINGULAIR) 10 MG tablet TAKE (1) TABLET BY MOUTH ONCE DAILY.   Multiple Vitamin (MULTIVITAMIN WITH MINERALS) TABS tablet Take 1 tablet by mouth daily.   pregabalin (LYRICA) 300 MG capsule Take 300 mg by mouth daily.   propranolol (INDERAL) 20 MG tablet Take 20 mg by mouth at bedtime.   Timothy Grass Pollen Allergen (GRASTEK) 2800 BAU SUBL Place 1 tablet under the tongue daily.   zolpidem (AMBIEN) 10 MG tablet Take 5 mg by mouth at bedtime.    Current Facility-Administered Medications for the 07/14/20 encounter (Office Visit) with Carole Civil, MD  Medication   triamcinolone acetonide (KENALOG) 10 MG/ML injection 10 mg        Arther Abbott, MD  07/14/2020 3:26 PM

## 2020-08-16 ENCOUNTER — Other Ambulatory Visit: Payer: Self-pay | Admitting: Allergy & Immunology

## 2020-09-19 ENCOUNTER — Other Ambulatory Visit: Payer: Self-pay

## 2020-09-19 ENCOUNTER — Ambulatory Visit: Payer: PPO

## 2020-09-19 ENCOUNTER — Ambulatory Visit: Payer: PPO | Admitting: Podiatry

## 2020-09-19 DIAGNOSIS — M722 Plantar fascial fibromatosis: Secondary | ICD-10-CM | POA: Diagnosis not present

## 2020-09-19 DIAGNOSIS — L600 Ingrowing nail: Secondary | ICD-10-CM | POA: Diagnosis not present

## 2020-09-19 MED ORDER — TRIAMCINOLONE ACETONIDE 10 MG/ML IJ SUSP
20.0000 mg | Freq: Once | INTRAMUSCULAR | Status: AC
  Administered 2020-09-19: 20 mg

## 2020-09-20 NOTE — Progress Notes (Signed)
Subjective:   Patient ID: Joyce Herrera, female   DOB: 60 y.o.   MRN: [id number]   HPI Patient states her heels have started to bother her again recently and she has been doing very well overall   ROS      Objective:  Physical Exam  Neurovascular status intact discomfort plantar aspect heel region bilateral with inflammation of the medial band at insertion     Assessment:  Acute Planter fasciitis improved but still present     Plan:  Sterile prep we injected the plantar fascial bilateral 3 mg Kenalog 5 mg Xylocaine and advised on continued stretching and shoe gear modifications

## 2020-10-17 ENCOUNTER — Other Ambulatory Visit (HOSPITAL_COMMUNITY): Payer: Self-pay | Admitting: Internal Medicine

## 2020-10-17 DIAGNOSIS — Z1231 Encounter for screening mammogram for malignant neoplasm of breast: Secondary | ICD-10-CM

## 2020-11-03 ENCOUNTER — Other Ambulatory Visit: Payer: Self-pay

## 2020-11-03 ENCOUNTER — Ambulatory Visit (HOSPITAL_COMMUNITY)
Admission: RE | Admit: 2020-11-03 | Discharge: 2020-11-03 | Disposition: A | Discharge status: Home / Self Care | Payer: PPO | Source: Ambulatory Visit | Attending: Internal Medicine | Admitting: Internal Medicine

## 2020-11-03 DIAGNOSIS — Z1231 Encounter for screening mammogram for malignant neoplasm of breast: Secondary | ICD-10-CM | POA: Insufficient documentation

## 2020-11-11 DIAGNOSIS — M1711 Unilateral primary osteoarthritis, right knee: Secondary | ICD-10-CM | POA: Diagnosis not present

## 2020-11-23 ENCOUNTER — Encounter (INDEPENDENT_AMBULATORY_CARE_PROVIDER_SITE_OTHER): Payer: Self-pay | Admitting: *Deleted

## 2020-12-01 DIAGNOSIS — M1711 Unilateral primary osteoarthritis, right knee: Secondary | ICD-10-CM | POA: Diagnosis not present

## 2020-12-01 DIAGNOSIS — Z96652 Presence of left artificial knee joint: Secondary | ICD-10-CM | POA: Diagnosis not present

## 2020-12-13 ENCOUNTER — Encounter: Payer: Self-pay | Admitting: General Surgery

## 2020-12-13 ENCOUNTER — Ambulatory Visit: Payer: PPO | Admitting: General Surgery

## 2020-12-13 ENCOUNTER — Other Ambulatory Visit: Payer: Self-pay

## 2020-12-13 VITALS — BP 116/80 | HR 104 | Temp 98.4°F | Resp 16 | Ht 68.0 in | Wt 176.0 lb

## 2020-12-13 DIAGNOSIS — K642 Third degree hemorrhoids: Secondary | ICD-10-CM | POA: Diagnosis not present

## 2020-12-13 NOTE — Patient Instructions (Signed)
Benefiber gummies, keep stools regular and soft.  Drink 64 ounces of water a day.   Hemorrhoids Hemorrhoids are swollen veins in and around the rectum or anus. There are two types of hemorrhoids: Internal hemorrhoids. These occur in the veins that are just inside the rectum. They may poke through to the outside and become irritated and painful. External hemorrhoids. These occur in the veins that are outside the anus and can be felt as a painful swelling or hard lump near the anus. Most hemorrhoids do not cause serious problems, and they can be managed with home treatments such as diet and lifestyle changes. If home treatments do not help the symptoms, procedures can be done to shrink or remove the hemorrhoids. What are the causes? This condition is caused by increased pressure in the anal area. This pressure may result from various things, including: Constipation. Straining to have a bowel movement. Diarrhea. Pregnancy. Obesity. Sitting for long periods of time. Heavy lifting or other activity that causes you to strain. Anal sex. Riding a bike for a long period of time. What are the signs or symptoms? Symptoms of this condition include: Pain. Anal itching or irritation. Rectal bleeding. Leakage of stool (feces). Anal swelling. One or more lumps around the anus. How is this diagnosed? This condition can often be diagnosed through a visual exam. Other exams or tests may also be done, such as: An exam that involves feeling the rectal area with a gloved hand (digital rectal exam). An exam of the anal canal that is done using a small tube (anoscope). A blood test, if you have lost a significant amount of blood. A test to look inside the colon using a flexible tube with a camera on the end (sigmoidoscopy or colonoscopy). How is this treated? This condition can usually be treated at home. However, various procedures may be done if dietary changes, lifestyle changes, and other home  treatments do not help your symptoms. These procedures can help make the hemorrhoids smaller or remove them completely. Some of these procedures involve surgery, and others do not. Common procedures include: Rubber band ligation. Rubber bands are placed at the base of the hemorrhoids to cut off their blood supply. Sclerotherapy. Medicine is injected into the hemorrhoids to shrink them. Infrared coagulation. A type of light energy is used to get rid of the hemorrhoids. Hemorrhoidectomy surgery. The hemorrhoids are surgically removed, and the veins that supply them are tied off. Stapled hemorrhoidopexy surgery. The surgeon staples the base of the hemorrhoid to the rectal wall. Follow these instructions at home: Eating and drinking  Eat foods that have a lot of fiber in them, such as whole grains, beans, nuts, fruits, and vegetables. Ask your health care provider about taking products that have added fiber (fiber supplements). Reduce the amount of fat in your diet. You can do this by eating low-fat dairy products, eating less red meat, and avoiding processed foods. Drink enough fluid to keep your urine pale yellow. Managing pain and swelling  Take warm sitz baths for 20 minutes, 3-4 times a day to ease pain and discomfort. You may do this in a bathtub or using a portable sitz bath that fits over the toilet. If directed, apply ice to the affected area. Using ice packs between sitz baths may be helpful. Put ice in a plastic bag. Place a towel between your skin and the bag. Leave the ice on for 20 minutes, 2-3 times a day. General instructions Take over-the-counter and prescription medicines only  as told by your health care provider. Use medicated creams or suppositories as told. Get regular exercise. Ask your health care provider how much and what kind of exercise is best for you. In general, you should do moderate exercise for at least 30 minutes on most days of the week (150 minutes each week).  This can include activities such as walking, biking, or yoga. Go to the bathroom when you have the urge to have a bowel movement. Do not wait. Avoid straining to have bowel movements. Keep the anal area dry and clean. Use wet toilet paper or moist towelettes after a bowel movement. Do not sit on the toilet for long periods of time. This increases blood pooling and pain. Keep all follow-up visits as told by your health care provider. This is important. Contact a health care provider if you have: Increasing pain and swelling that are not controlled by treatment or medicine. Difficulty having a bowel movement, or you are unable to have a bowel movement. Pain or inflammation outside the area of the hemorrhoids. Get help right away if you have: Uncontrolled bleeding from your rectum. Summary Hemorrhoids are swollen veins in and around the rectum or anus. Most hemorrhoids can be managed with home treatments such as diet and lifestyle changes. Taking warm sitz baths can help ease pain and discomfort. In severe cases, procedures or surgery can be done to shrink or remove the hemorrhoids. This information is not intended to replace advice given to you by your health care provider. Make sure you discuss any questions you have with your health care provider.  Possibly you had a Thrombosed hemorrhoid in the past with Dr. Channing Mutters. You do not have that now.   Document Revised: 07/27/2020 Document Reviewed: 07/27/2020 Elsevier Patient Education  [place].

## 2020-12-13 NOTE — Progress Notes (Signed)
[facility] History and Physical  Reason for Referral: Hemorrhoids  Referring Physician: Self referral   Chief Complaint   New Patient (Initial Visit)     Joyce Herrera is a 60 y.o. female.  HPI: Joyce Herrera is a 60 yo who has had hemorrhoids for years since pregnancy and has had issues on and off at times. A few weeks ago she started having a flare and noted a large swollen hemorrhoid and some minor bleeding. She started using hemorrhoid creams and doing sitz baths with resolution of her issues. Today she is feeling well and says her hemorrhoids are not flared. In the past Dr. Marnette Burgess did some numbing spray and lanced a hemorrhoid and she says she was wondering if something like that needed to be done today.   She says that she has regular Bms most of the time for 3-4 days and then can have constipation and a hard stool for 3-4 days. She does not take anything regularly for Bms and has tried fiber in the past but says she just feels like her diet is to blame. She had a colonoscopy in 2017 and is due again soon she reports.   Past Medical History:  Diagnosis Date   Asthma    Headache(784.0)    History of colon polyps    Hypothyroidism    PONV (postoperative nausea and vomiting)    Seasonal allergies     Past Surgical History:  Procedure Laterality Date   ABDOMINAL HYSTERECTOMY     BIOPSY  10/23/2017   Procedure: BIOPSY;  Surgeon: Rogene Houston, MD;  Location: [facility];  Service: Endoscopy;;  gastric   CHOLECYSTECTOMY     COLONOSCOPY  09/14/2010   Procedure: COLONOSCOPY;  Surgeon: Rogene Houston, MD;  Location: [facility];  Service: Endoscopy;  Laterality: N/A;   COLONOSCOPY N/A 01/11/2016   Procedure: COLONOSCOPY;  Surgeon: Rogene Houston, MD;  Location: [facility];  Service: Endoscopy;  Laterality: N/A;  930   ESOPHAGEAL DILATION N/A 10/23/2017   Procedure: ESOPHAGEAL DILATION;  Surgeon: Rogene Houston, MD;  Location: [facility];  Service:  Endoscopy;  Laterality: N/A;   ESOPHAGOGASTRODUODENOSCOPY N/A 10/23/2017   Procedure: ESOPHAGOGASTRODUODENOSCOPY (EGD);  Surgeon: Rogene Houston, MD;  Location: [facility];  Service: Endoscopy;  Laterality: N/A;  11:25-rescheduled to 9/25 @ 8:30am per Lelon Frohlich   MEDIAL PARTIAL KNEE REPLACEMENT Left    NASAL SEPTUM SURGERY     PLANTAR FASCIECTOMY     bilateral   POLYPECTOMY  01/11/2016   Procedure: POLYPECTOMY;  Surgeon: Rogene Houston, MD;  Location: [facility];  Service: Endoscopy;;  multiple colon polyps    TONSILLECTOMY      Family History  Problem Relation Age of Onset   Lung cancer Mother    Kidney cancer Mother    Colon cancer Father    Prostate cancer Father    Diabetes Father    Hypertension Father    Kidney failure Father     Social History   Tobacco Use   Smoking status: Former   Smokeless tobacco: Never   Tobacco comments:    quit October 2004  Vaping Use   Vaping Use: Never used  Substance Use Topics   Alcohol use: No   Drug use: No    Medications: I have reviewed the patient's current medications. Allergies as of 12/13/2020       Reactions   Cephalexin Other (See Comments)   Pt stated, "Ears  started to itch; hands and feet were itching"   Penicillins Rash   Has patient had a PCN reaction causing immediate rash, facial/tongue/throat swelling, SOB or lightheadedness with hypotension: No Has patient had a PCN reaction causing severe rash involving mucus membranes or skin necrosis: No Has patient had a PCN reaction that required hospitalization: No Has patient had a PCN reaction occurring within the last 10 years: No If all of the above answers are "NO", then may proceed with Cephalosporin use.        Medication List        Accurate as of December 13, 2020 11:59 PM. If you have any questions, ask your nurse or doctor.          STOP taking these medications    doxycycline 100 MG tablet Commonly known as: VIBRA-TABS Stopped by: Virl Cagey, MD   [address]AU [address] Generic drug: Jalene Mullet Pollen Allergen Stopped by: Virl Cagey, MD       TAKE these medications    ALPRAZolam 0.5 MG tablet Commonly known as: XANAX Take 0.25 mg by mouth daily as needed for anxiety.   calcium-vitamin D 500-200 MG-UNIT tablet Commonly known as: OSCAL WITH D Take 1 tablet by mouth daily with breakfast.   flurbiprofen 100 MG tablet Commonly known as: ANSAID Take 100 mg by mouth daily as needed (headache).   ipratropium 0.03 % nasal spray Commonly known as: ATROVENT Place 1 spray into both nostrils every 8 (eight) hours as needed for rhinitis.   levocetirizine 5 MG tablet Commonly known as: XYZAL TAKE (1) TABLET BY MOUTH TWICE DAILY.   montelukast 10 MG tablet Commonly known as: SINGULAIR TAKE (1) TABLET BY MOUTH ONCE DAILY.   multivitamin with minerals Tabs tablet Take 1 tablet by mouth daily.   pregabalin 300 MG capsule Commonly known as: LYRICA Take 300 mg by mouth daily.   propranolol 20 MG tablet Commonly known as: INDERAL Take 20 mg by mouth at bedtime.   zolpidem 10 MG tablet Commonly known as: AMBIEN Take 5 mg by mouth at bedtime.         ROS:  A comprehensive review of systems was negative except for: Respiratory: positive for cough and wheezing Gastrointestinal: positive for reflux symptoms and rectal bleeding and pain with hemorrhoid flare Musculoskeletal: positive for knee pain , joint pain  Blood pressure 116/80, pulse (!) 104, temperature 98.4 F (36.9 C), temperature source Other (Comment), resp. rate 16, height 5\' 8"  (1.727 m), weight 176 lb (79.8 kg), last menstrual period 01/30/1992, SpO2 95 %. Physical Exam Vitals reviewed.  Constitutional:      Appearance: Normal appearance.  HENT:     Head: Normocephalic.     Mouth/Throat:     Mouth: Mucous membranes are moist.  Eyes:     Extraocular Movements: Extraocular movements intact.  Cardiovascular:     Rate and Rhythm:  Normal rate and regular rhythm.  Pulmonary:     Effort: Pulmonary effort is normal.     Breath sounds: Normal breath sounds.  Abdominal:     General: There is no distension.     Palpations: Abdomen is soft.     Tenderness: There is no abdominal tenderness.  Genitourinary:    Rectum: Internal hemorrhoid present. No anal fissure. Normal anal tone.     Comments: No major skin tags or external component of hemorrhoid, prolapses with valsalva  Musculoskeletal:        General: Normal range of motion.  Cervical back: Normal range of motion.  Skin:    General: Skin is warm.  Neurological:     General: No focal deficit present.     Mental Status: She is alert and oriented to person, place, and time.  Psychiatric:        Mood and Affect: Mood normal.        Behavior: Behavior normal.    Results: None    Assessment & Plan:  DAELYN PETTAWAY is a 60 y.o. female with likely grade III hemorrhoids and probably a prior history of thrombosed hemorrhoid based on her description with Dr. Marnette Burgess. She has no thrombosed hemorrhoid today and her symptoms have improved with her management. Reassured her that she did everything appropriately and was able to get the flare under control.   Hemorrhoid surgery for external hemorrhoids is very painful. The pain and discomfort that the patient is having currently will be magnified after the surgery for at least 2-3 weeks.  The patient will have feelings of constant pressure and pain in the area from the swelling and removal of the anoderm (skin around the anus). The internal hemorrhoids are not painful to remove because the same nerves are not involved, and the sensation is different, but removal of any external hemorrhoids will cause significant discomfort. They will need at least 4-6 weeks to recover from the surgery, and should not expect to be able to feel back to "normal for 6-8 weeks."    The risk of hemorrhoid surgery include bleeding, risk of  infection although rare, and the risk of narrowing the anal canal if too much tissue is removed. Given this risk, it is likely that only the 2 largest hemorrhoid columns would be removed during the initial surgery.  We have also discussed the risk of incontinence after surgery if the muscles were injured, and although this is rare that it can happen and is another reason to limit the amount of hemorrhoids removed.    She does not think she is ready or has enough problems to warrant a surgery at this time. She is going to continue with her conservative treatment.   All questions were answered to the satisfaction of the patient.  PRN follow up   Virl Cagey 12/15/2020, 11:26 AM

## 2020-12-29 ENCOUNTER — Other Ambulatory Visit: Payer: Self-pay

## 2020-12-29 ENCOUNTER — Encounter: Payer: Self-pay | Admitting: Podiatry

## 2020-12-29 ENCOUNTER — Ambulatory Visit: Payer: PPO | Admitting: Podiatry

## 2020-12-29 DIAGNOSIS — M722 Plantar fascial fibromatosis: Secondary | ICD-10-CM | POA: Diagnosis not present

## 2020-12-29 MED ORDER — TRIAMCINOLONE ACETONIDE 10 MG/ML IJ SUSP
20.0000 mg | Freq: Once | INTRAMUSCULAR | Status: AC
  Administered 2020-12-29: 20 mg

## 2020-12-29 NOTE — Progress Notes (Signed)
Subjective:   Patient ID: Joyce Herrera, female   DOB: 60 y.o.   MRN: [id number]   HPI Patient is presenting with heel pain stating that she is due to have a knee replacement in around 4 weeks has been walking badly and flared them up earlier.  Knows eventually she may need surgery but we want to try to get her through her knee surgery before considering anything   ROS      Objective:  Physical Exam  Neurovascular status intact acute discomfort which is occurred in the heel region bilateral     Assessment:  Acute plantar fasciitis bilateral     Plan:  Sterile prep injected the plantar fascial bilateral 3 mg Kenalog 5 mg Xylocaine and reduced activity utilize heel lifts and will be seen back as needed after having her knee surgery

## 2021-01-09 NOTE — Progress Notes (Signed)
Surgery orders requested via Epic inbox. °

## 2021-01-19 NOTE — H&P (Signed)
TOTAL KNEE REVISION ADMISSION H&P  Patient is being admitted for conversion of unicompartmental arthroplasty to TKA on her left knee.   Subjective:  Chief Complaint: Left knee pain.  HPI: Joyce Herrera comes to clinic for a recheck of her bilateral knees. She has a history of a left knee unicompartmental arthroplasty. She reports she has continued to have pain in the left knee and feels that her knee is slipping. She previously received a right knee cortisone injection 3 weeks ago. She notes the injection did not provide relief.   She would like to discuss a potential revision at this time. She is taking ibuprofen for pain.   Joyce Herrera is a pleasant 60 year old female who comes to the clinic for follow-up of left knee pain. The patient indicates her left knee is giving her more pain. She notes she went on vacation and did a significant amount of walking and it did not feel normal.    The patient denies any swelling in her knee. She notes her knee feels tight and painful. She states she is able to start walking without difficulty, but after a prolonged period of time, her pain returns. The patient localizes her pain to the medial aspect of her knee. She notes her partial knee arthroplasty has been present for approximately 15 to 16 years now. She states her left knee is more bothersome than her right knee. She is apprehensive about surgery secondary to her last outcome.    The patient indicates the injection she received into her right knee did not provide any relief. She notes she has been taking gabapentin for her feet.    The patient indicates she has gained some weight because she cannot be as active as she would like to be. She notes she has difficulty rising from a seated position and getting down to the floor.   Patient Active Problem List   Diagnosis Date Noted   Grade III hemorrhoids 12/13/2020   Esophageal dysphagia 05/21/2017   Gastroesophageal reflux disease 04/23/2017   Vaginal  irritation 07/19/2016   Urinary frequency 07/19/2016   Pelvic relaxation due to cystocele, midline 07/19/2016   Atrophic vaginitis 07/19/2016   Seasonal allergic rhinitis due to pollen 07/17/2016   Moderate persistent asthma, uncomplicated 80/03/4915   Drug allergy 07/17/2016   Chronic nonintractable headache 07/17/2016   Allergic contact dermatitis due to metals 07/17/2016   Class 1 obesity due to excess calories without serious comorbidity with body mass index (BMI) of 30.0 to 30.9 in adult 03/14/2016   Family hx of colon cancer 09/16/2015   History of colonic polyps 09/16/2015   Cough 09/15/2015   Laryngopharyngeal reflux (LPR) 09/15/2015   Subclinical hyperthyroidism 08/24/2015   Hyperlipidemia 08/24/2015    Past Medical History:  Diagnosis Date   Asthma    Headache(784.0)    History of colon polyps    Hypothyroidism    PONV (postoperative nausea and vomiting)    Seasonal allergies     Past Surgical History:  Procedure Laterality Date   ABDOMINAL HYSTERECTOMY     BIOPSY  10/23/2017   Procedure: BIOPSY;  Surgeon: Rogene Houston, MD;  Location: [facility];  Service: Endoscopy;;  gastric   CHOLECYSTECTOMY     COLONOSCOPY  09/14/2010   Procedure: COLONOSCOPY;  Surgeon: Rogene Houston, MD;  Location: [facility];  Service: Endoscopy;  Laterality: N/A;   COLONOSCOPY N/A 01/11/2016   Procedure: COLONOSCOPY;  Surgeon: Rogene Houston, MD;  Location: [facility];  Service:  Endoscopy;  Laterality: N/A;  930   ESOPHAGEAL DILATION N/A 10/23/2017   Procedure: ESOPHAGEAL DILATION;  Surgeon: Rogene Houston, MD;  Location: [facility];  Service: Endoscopy;  Laterality: N/A;   ESOPHAGOGASTRODUODENOSCOPY N/A 10/23/2017   Procedure: ESOPHAGOGASTRODUODENOSCOPY (EGD);  Surgeon: Rogene Houston, MD;  Location: [facility];  Service: Endoscopy;  Laterality: N/A;  11:25-rescheduled to 9/25 @ 8:30am per Lelon Frohlich   MEDIAL PARTIAL KNEE REPLACEMENT Left    NASAL SEPTUM SURGERY     PLANTAR  FASCIECTOMY     bilateral   POLYPECTOMY  01/11/2016   Procedure: POLYPECTOMY;  Surgeon: Rogene Houston, MD;  Location: [facility];  Service: Endoscopy;;  multiple colon polyps    TONSILLECTOMY      Prior to Admission medications   Medication Sig Start Date End Date Taking? Authorizing Provider  ALPRAZolam Duanne Moron) 0.5 MG tablet Take 0.25 mg by mouth daily as needed for anxiety.  12/22/12  Yes [provider]  aspirin EC 81 MG tablet Take 81 mg by mouth daily. Swallow whole.   Yes [provider]  atorvastatin (LIPITOR) 10 MG tablet Take 10 mg by mouth daily.   Yes [provider]  CALCIUM CARBONATE-VITAMIN D PO Take 1 tablet by mouth daily with breakfast.   Yes [provider]  ELDERBERRY PO Take 1 tablet by mouth daily.   Yes [provider]  Epinastine HCl 0.05 % ophthalmic solution Place 1 drop into both eyes 2 (two) times daily as needed (allergies).   Yes [provider]  esomeprazole (NEXIUM) 40 MG capsule Take 40 mg by mouth daily as needed (acid reflux).   Yes [provider]  flurbiprofen (ANSAID) 100 MG tablet Take 100 mg by mouth daily as needed (headache).  07/11/16  Yes [provider]  ipratropium (ATROVENT) 0.03 % nasal spray Place 1 spray into both nostrils every 8 (eight) hours as needed for rhinitis. 09/11/19  Yes Valentina Shaggy, MD  Lactobacillus-Inulin ([place] PO) Take 1 capsule by mouth daily.   Yes [provider]  levocetirizine (XYZAL) 5 MG tablet TAKE (1) TABLET BY MOUTH TWICE DAILY. Patient taking differently: Take 5 mg by mouth every evening. 05/09/20  Yes Valentina Shaggy, MD  montelukast (SINGULAIR) 10 MG tablet TAKE (1) TABLET BY MOUTH ONCE DAILY. 05/09/20  Yes Valentina Shaggy, MD  Multiple Vitamin (MULTIVITAMIN WITH MINERALS) TABS tablet Take 1 tablet by mouth daily.   Yes [provider]  ODOR FREE GARLIC PO Take 1 capsule by mouth  daily.   Yes [provider]  pregabalin (LYRICA) 300 MG capsule Take 300 mg by mouth every evening.   Yes [provider]  Probiotic Product (PROBIOTIC DAILY) CAPS Take 1 capsule by mouth daily.   Yes [provider]  propranolol (INDERAL) 20 MG tablet Take 20 mg by mouth at bedtime.   Yes [provider]  zolpidem (AMBIEN) 10 MG tablet Take 5 mg by mouth at bedtime. 07/11/16  Yes [provider]    Allergies  Allergen Reactions   Cephalexin Other (See Comments)    Pt stated, "Ears started to itch; hands and feet were itching"   Penicillins Rash    Has patient had a PCN reaction causing immediate rash, facial/tongue/throat swelling, SOB or lightheadedness with hypotension: No Has patient had a PCN reaction causing severe rash involving mucus membranes or skin necrosis: No Has patient had a PCN reaction that required hospitalization: No Has patient had a  PCN reaction occurring within the last 10 years: No If all of the above answers are "NO", then may proceed with Cephalosporin use.     Social History   Socioeconomic History   Marital status: Married    Spouse name: Not on file   Number of children: Not on file   Years of education: Not on file   Highest education level: Not on file  Occupational History   Not on file  Tobacco Use   Smoking status: Former   Smokeless tobacco: Never   Tobacco comments:    quit October 2004  Vaping Use   Vaping Use: Never used  Substance and Sexual Activity   Alcohol use: No   Drug use: No   Sexual activity: Never  Other Topics Concern   Not on file  Social History Narrative   Not on file   Social Determinants of Health   Financial Resource Strain: Not on file  Food Insecurity: Not on file  Transportation Needs: Not on file  Physical Activity: Not on file  Stress: Not on file  Social Connections: Not on file  Intimate Partner Violence: Not on file    Tobacco Use: Medium Risk   Smoking  Tobacco Use: Former   Smokeless Tobacco Use: Never   Passive Exposure: Not on file   Social History   Substance and Sexual Activity  Alcohol Use No    Family History  Problem Relation Age of Onset   Lung cancer Mother    Kidney cancer Mother    Colon cancer Father    Prostate cancer Father    Diabetes Father    Hypertension Father    Kidney failure Father     ROS: Constitutional: no fever, no chills, no night sweats, no significant weight loss Cardiovascular: no chest pain, no palpitations Respiratory: no cough, no shortness of breath, No COPD Gastrointestinal: no vomiting, no nausea Musculoskeletal: no swelling in Joints, Joint Pain Neurologic: no numbness, no tingling, no difficulty with balance   Objective:  Physical Exam: Well nourished and well developed.  General: Alert and oriented x3, cooperative and pleasant, no acute distress.  Head: normocephalic, atraumatic, neck supple.  Eyes: EOMI.  Respiratory: breath sounds clear in all fields, no wheezing, rales, or rhonchi. Cardiovascular: Regular rate and rhythm, no murmurs, gallops or rubs.  Abdomen: non-tender to palpation and soft, normoactive bowel sounds. Musculoskeletal:  The patient has an antalgic gait pattern favoring the left side.    Left Knee Exam:  No effusion present. No swelling present.  The range of motion is: 0 to 120 degrees.  Positive crepitus on range of motion of the knee.  Positive medial joint line tenderness.  No lateral joint line tenderness.  The knee is stable.   Calves soft and nontender. Motor function intact in LE. Strength 5/5 LE bilaterally. Neuro: Distal pulses 2+. Sensation to light touch intact in LE.    Vital signs in last 24 hours: BP: ()/()  Arterial Line BP: ()/()   Imaging Review Radiographs- AP and lateral of the bilateral knees obtained 1 month ago demonstrate medial joint space narrowing on the right knee, but not bone-on-bone. The left knee shows a medial  unicompartmental arthroplasty in good position with no definitive periprosthetic abnormalities.   Assessment/Plan:  Left knee osteoarthritis; s/p left UKA  We discussed the patient's presenting complaints, history, and treatment options. She has a painful unicompartmental replacement, which is getting worse. She was trying to avoid surgery, but at this point, she feels  like she needs to do it because of the worsening pain and dysfunction. We did discuss revision of the unicompartmental to total knee arthroplasty in detail. She would like to go ahead and proceed with that. Information is given to [facility] for scheduling   Patient's anticipated LOS is less than 2 midnights, meeting these requirements: - Younger than 88 - Lives within 1 hour of care - Has a competent adult at home to recover with post-op recover - NO history of  - Chronic pain requiring opiods  - Diabetes  - Coronary Artery Disease  - Heart failure  - Heart attack  - Stroke  - DVT/VTE  - Cardiac arrhythmia  - Respiratory Failure/COPD  - Renal failure  - Anemia  - Advanced Liver disease    Therapy Plans: Patient wanting [place] covers 100%  Disposition: Home with husband Planned DVT Prophylaxis: Aspirin 325mg   DME Needed: 3-in-1 PCP: Pt contacting PCP for clearance TXA: IV Allergies: Keflex (itching, hives), PCN (rash) Anesthesia Concerns: N/V (Nausea for two weeks following previous knee surgery) BMI: 27.1 Last HgbA1c: n/a  Pharmacy: [facility] in [place]  Other: - Oxycodone caused N/V   - Patient was instructed on what medications to stop prior to surgery. - Follow-up visit in 2 weeks with Dr. Wynelle Link - Begin physical therapy following surgery - Pre-operative lab work as pre-surgical testing - Prescriptions will be provided in hospital at time of discharge  Fenton Foy, [facility], PA-C Orthopedic Surgery [facility] [place]

## 2021-01-20 NOTE — Progress Notes (Addendum)
COVID swab appointment: 02/01/21 DOS  COVID Vaccine Completed: yes x2 Date COVID Vaccine completed: 04/18/19, 05/20/19 Has received booster: COVID vaccine manufacturer: Moderna    Date of COVID positive in last 90 days: no  PCP - [facility]mond School, MD Cardiologist - n/a  Chest x-ray - n/a EKG - 01/25/21 Epic/chart Stress Test - n/a ECHO - n/a Cardiac Cath - n/a Pacemaker/ICD device last checked: n/a Spinal Cord Stimulator: n/a  Sleep Study - n/a CPAP -   Fasting Blood Sugar - n/a Checks Blood Sugar _____ times a day  Blood Thinner Instructions: Aspirin Instructions: ASA 81, hold 7 days Last Dose:  Activity level: Can go up a flight of stairs and perform activities of daily living without stopping and without symptoms of chest pain or shortness of breath.  Anesthesia review:   Patient denies shortness of breath, fever, cough and chest pain at PAT appointment   Patient verbalized understanding of instructions that were given to them at the PAT appointment. Patient was also instructed that they will need to review over the PAT instructions again at home before surgery.

## 2021-01-20 NOTE — Patient Instructions (Addendum)
DUE TO COVID-19 ONLY ONE VISITOR IS ALLOWED TO COME WITH YOU AND STAY IN THE WAITING ROOM ONLY DURING PRE OP AND PROCEDURE.   **NO VISITORS ARE ALLOWED IN THE SHORT STAY AREA OR RECOVERY ROOM!!**  IF YOU WILL BE ADMITTED INTO THE HOSPITAL YOU ARE ALLOWED ONLY TWO SUPPORT PEOPLE DURING VISITATION HOURS ONLY (7AM -8PM)   The support person(s) may change daily. The support person(s) must pass our screening, gel in and out, and wear a mask at all times, including in the patients room. Patients must also wear a mask when staff or their support person are in the room.  No visitors under the age of 23. Any visitor under the age of 38 must be accompanied by an adult.    COVID SWAB TESTING MUST BE COMPLETED ON:  02/01/21 morning of surgery   Your procedure is scheduled on: 02/01/21   Report to [facility] Main Entrance    Report to admitting at 6:00 AM   Call this number if you have problems the morning of surgery [phone]   Do not eat food :After Midnight.   May have liquids until 5:30 AM day of surgery  CLEAR LIQUID DIET  Foods Allowed                                                                     Foods Excluded  Water, Black Coffee and tea (no milk or creamer)           liquids that you cannot  Plain Jell-O in any flavor  (No red)                                    see through such as: Fruit ices (not with fruit pulp)                                            milk, soups, orange juice              Iced Popsicles (No red)                                                All solid food                                   Apple juices Sports drinks like Gatorade (No red) Lightly seasoned clear broth or consume(fat free) Sugar     The day of surgery:  Drink ONE (1) Pre-Surgery Clear Ensure by 5:30 am the morning of surgery. Drink in one sitting. Do not sip.  This drink was given to you during your hospital  pre-op appointment visit. Nothing else to drink after completing  the  Pre-Surgery Clear Ensure.          If you have questions, please contact your surgeons office.     Oral Hygiene  is also important to reduce your risk of infection.                                    Remember - BRUSH YOUR TEETH THE MORNING OF SURGERY WITH YOUR REGULAR TOOTHPASTE   Take these medicines the morning of surgery with A SIP OF WATER: Xanax, Lipitor, Nexium, Singulair                               You may not have any metal on your body including hair pins, jewelry, and body piercing             Do not wear make-up, lotions, powders, perfumes, or deodorant  Do not wear nail polish including gel and S&S, artificial/acrylic nails, or any other type of covering on natural nails including finger and toenails. If you have artificial nails, gel coating, etc. that needs to be removed by a nail salon please have this removed prior to surgery or surgery may need to be canceled/ delayed if the surgeon/ anesthesia feels like they are unable to be safely monitored.   Do not shave  48 hours prior to surgery.    Do not bring valuables to the hospital. [place].   Bring small overnight bag day of surgery.              Please read over the following fact sheets you were given: IF YOU HAVE QUESTIONS ABOUT YOUR PRE-OP INSTRUCTIONS PLEASE CALL [facility] - Preparing for Surgery Before surgery, you can play an important role.  Because skin is not sterile, your skin needs to be as free of germs as possible.  You can reduce the number of germs on your skin by washing with CHG (chlorahexidine gluconate) soap before surgery.  CHG is an antiseptic cleaner which kills germs and bonds with the skin to continue killing germs even after washing. Please DO NOT use if you have an allergy to CHG or antibacterial soaps.  If your skin becomes reddened/irritated stop using the CHG and inform your nurse when you arrive at Short  Stay. Do not shave (including legs and underarms) for at least 48 hours prior to the first CHG shower.  You may shave your face/neck.  Please follow these instructions carefully:  1.  Shower with CHG Soap the night before surgery and the  morning of surgery.  2.  If you choose to wash your hair, wash your hair first as usual with your normal  shampoo.  3.  After you shampoo, rinse your hair and body thoroughly to remove the shampoo.                             4.  Use CHG as you would any other liquid soap.  You can apply chg directly to the skin and wash.  Gently with a scrungie or clean washcloth.  5.  Apply the CHG Soap to your body ONLY FROM THE NECK DOWN.   Do   not use on face/ open  Wound or open sores. Avoid contact with eyes, ears mouth and   genitals (private parts).                       Wash face,  Genitals (private parts) with your normal soap.             6.  Wash thoroughly, paying special attention to the area where your    surgery  will be performed.  7.  Thoroughly rinse your body with warm water from the neck down.  8.  DO NOT shower/wash with your normal soap after using and rinsing off the CHG Soap.                9.  Pat yourself dry with a clean towel.            10.  Wear clean pajamas.            11.  Place clean sheets on your bed the night of your first shower and do not  sleep with pets. Day of Surgery : Do not apply any lotions/deodorants the morning of surgery.  Please wear clean clothes to the hospital/surgery center.  FAILURE TO FOLLOW THESE INSTRUCTIONS MAY RESULT IN THE CANCELLATION OF YOUR SURGERY  PATIENT SIGNATURE_________________________________  NURSE SIGNATURE__________________________________  ________________________________________________________________________   Adam Phenix  An incentive spirometer is a tool that can help keep your lungs clear and active. This tool measures how well you are filling your  lungs with each breath. Taking long deep breaths may help reverse or decrease the chance of developing breathing (pulmonary) problems (especially infection) following: A long period of time when you are unable to move or be active. BEFORE THE PROCEDURE  If the spirometer includes an indicator to show your best effort, your nurse or respiratory therapist will set it to a desired goal. If possible, sit up straight or lean slightly forward. Try not to slouch. Hold the incentive spirometer in an upright position. INSTRUCTIONS FOR USE  Sit on the edge of your bed if possible, or sit up as far as you can in bed or on a chair. Hold the incentive spirometer in an upright position. Breathe out normally. Place the mouthpiece in your mouth and seal your lips tightly around it. Breathe in slowly and as deeply as possible, raising the piston or the ball toward the top of the column. Hold your breath for 3-5 seconds or for as long as possible. Allow the piston or ball to fall to the bottom of the column. Remove the mouthpiece from your mouth and breathe out normally. Rest for a few seconds and repeat Steps 1 through 7 at least 10 times every 1-2 hours when you are awake. Take your time and take a few normal breaths between deep breaths. The spirometer may include an indicator to show your best effort. Use the indicator as a goal to work toward during each repetition. After each set of 10 deep breaths, practice coughing to be sure your lungs are clear. If you have an incision (the cut made at the time of surgery), support your incision when coughing by placing a pillow or rolled up towels firmly against it. Once you are able to get out of bed, walk around indoors and cough well. You may stop using the incentive spirometer when instructed by your caregiver.  RISKS AND COMPLICATIONS Take your time so you do not get dizzy or light-headed. If you are in pain, you may  need to take or ask for pain medication before  doing incentive spirometry. It is harder to take a deep breath if you are having pain. AFTER USE Rest and breathe slowly and easily. It can be helpful to keep track of a log of your progress. Your caregiver can provide you with a simple table to help with this. If you are using the spirometer at home, follow these instructions: [place] IF:  You are having difficultly using the spirometer. You have trouble using the spirometer as often as instructed. Your pain medication is not giving enough relief while using the spirometer. You develop fever of 100.5 F (38.1 C) or higher. SEEK IMMEDIATE MEDICAL CARE IF:  You cough up bloody sputum that had not been present before. You develop fever of 102 F (38.9 C) or greater. You develop worsening pain at or near the incision site. MAKE SURE YOU:  Understand these instructions. Will watch your condition. Will get help right away if you are not doing well or get worse. Document Released: 05/28/2006 Document Revised: 04/09/2011 Document Reviewed: 07/29/2006 Exit[employer] Patient Information 2014 [employer], [place].   ________________________________________________________________________  WHAT IS A BLOOD TRANSFUSION? Blood Transfusion Information  A transfusion is the replacement of blood or some of its parts. Blood is made up of multiple cells which provide different functions. Red blood cells carry oxygen and are used for blood loss replacement. White blood cells fight against infection. Platelets control bleeding. Plasma helps clot blood. Other blood products are available for specialized needs, such as hemophilia or other clotting disorders. BEFORE THE TRANSFUSION  Who gives blood for transfusions?  Healthy volunteers who are fully evaluated to make sure their blood is safe. This is blood bank blood. Transfusion therapy is the safest it has ever been in the practice of medicine. Before blood is taken from a donor, a complete history is  taken to make sure that person has no history of diseases nor engages in risky social behavior (examples are intravenous drug use or sexual activity with multiple partners). The donor's travel history is screened to minimize risk of transmitting infections, such as malaria. The donated blood is tested for signs of infectious diseases, such as HIV and hepatitis. The blood is then tested to be sure it is compatible with you in order to minimize the chance of a transfusion reaction. If you or a relative donates blood, this is often done in anticipation of surgery and is not appropriate for emergency situations. It takes many days to process the donated blood. RISKS AND COMPLICATIONS Although transfusion therapy is very safe and saves many lives, the main dangers of transfusion include:  Getting an infectious disease. Developing a transfusion reaction. This is an allergic reaction to something in the blood you were given. Every precaution is taken to prevent this. The decision to have a blood transfusion has been considered carefully by your caregiver before blood is given. Blood is not given unless the benefits outweigh the risks. AFTER THE TRANSFUSION Right after receiving a blood transfusion, you will usually feel much better and more energetic. This is especially true if your red blood cells have gotten low (anemic). The transfusion raises the level of the red blood cells which carry oxygen, and this usually causes an energy increase. The nurse administering the transfusion will monitor you carefully for complications. HOME CARE INSTRUCTIONS  No special instructions are needed after a transfusion. You may find your energy is better. Speak with your caregiver about any limitations on activity  for underlying diseases you may have. SEEK MEDICAL CARE IF:  Your condition is not improving after your transfusion. You develop redness or irritation at the intravenous (IV) site. SEEK IMMEDIATE MEDICAL CARE IF:   Any of the following symptoms occur over the next 12 hours: Shaking chills. You have a temperature by mouth above 102 F (38.9 C), not controlled by medicine. Chest, back, or muscle pain. People around you feel you are not acting correctly or are confused. Shortness of breath or difficulty breathing. Dizziness and fainting. You get a rash or develop hives. You have a decrease in urine output. Your urine turns a dark color or changes to pink, red, or brown. Any of the following symptoms occur over the next 10 days: You have a temperature by mouth above 102 F (38.9 C), not controlled by medicine. Shortness of breath. Weakness after normal activity. The white part of the eye turns yellow (jaundice). You have a decrease in the amount of urine or are urinating less often. Your urine turns a dark color or changes to pink, red, or brown. Document Released: 01/13/2000 Document Revised: 04/09/2011 Document Reviewed: 09/01/2007 [facility] Patient Information 2014 [place], [place].  _______________________________________________________________________

## 2021-01-25 ENCOUNTER — Encounter (HOSPITAL_COMMUNITY): Payer: Self-pay

## 2021-01-25 ENCOUNTER — Encounter (HOSPITAL_COMMUNITY)
Admission: RE | Admit: 2021-01-25 | Discharge: 2021-01-25 | Disposition: A | Discharge status: Home / Self Care | Payer: PPO | Source: Ambulatory Visit | Attending: Orthopedic Surgery | Admitting: Orthopedic Surgery

## 2021-01-25 ENCOUNTER — Other Ambulatory Visit: Payer: Self-pay

## 2021-01-25 VITALS — BP 126/81 | HR 86 | Temp 97.7°F | Resp 16 | Ht 65.0 in

## 2021-01-25 DIAGNOSIS — Z01818 Encounter for other preprocedural examination: Secondary | ICD-10-CM | POA: Diagnosis not present

## 2021-01-25 DIAGNOSIS — Z96659 Presence of unspecified artificial knee joint: Secondary | ICD-10-CM | POA: Diagnosis not present

## 2021-01-25 HISTORY — DX: Anxiety disorder, unspecified: F41.9

## 2021-01-25 HISTORY — DX: Thyrotoxicosis, unspecified without thyrotoxic crisis or storm: E05.90

## 2021-01-25 HISTORY — DX: Plantar fascial fibromatosis: M72.2

## 2021-01-25 HISTORY — DX: Pneumonia, unspecified organism: J18.9

## 2021-01-25 HISTORY — DX: Unspecified osteoarthritis, unspecified site: M19.90

## 2021-01-25 HISTORY — DX: Palpitations: R00.2

## 2021-01-25 HISTORY — DX: Gastro-esophageal reflux disease without esophagitis: K21.9

## 2021-01-25 HISTORY — DX: Depression, unspecified: F32.A

## 2021-01-25 LAB — SURGICAL PCR SCREEN
MRSA, PCR: NEGATIVE
Staphylococcus aureus: NEGATIVE

## 2021-01-25 LAB — COMPREHENSIVE METABOLIC PANEL
ALT: 27 U/L (ref 0–44)
AST: 24 U/L (ref 15–41)
Albumin: 4.1 g/dL (ref 3.5–5.0)
Alkaline Phosphatase: 91 U/L (ref 38–126)
Anion gap: 7 (ref 5–15)
BUN: 15 mg/dL (ref 8–23)
CO2: 31 mmol/L (ref 22–32)
Calcium: 9.6 mg/dL (ref 8.9–10.3)
Chloride: 102 mmol/L (ref 98–111)
Creatinine, Ser: 0.62 mg/dL (ref 0.44–1.00)
GFR, Estimated: >60 mL/min (ref >60)
Glucose, Bld: 101 mg/dL — ABNORMAL HIGH (ref 70–99)
Potassium: 4.1 mmol/L (ref 3.5–5.1)
Sodium: 140 mmol/L (ref 135–145)
Total Bilirubin: 0.6 mg/dL (ref 0.3–1.2)
Total Protein: 7.2 g/dL (ref 6.5–8.1)

## 2021-01-25 LAB — PROTIME-INR
INR: 0.9 (ref 0.8–1.2)
Prothrombin Time: 12 seconds (ref 11.4–15.2)

## 2021-01-25 LAB — CBC
HCT: 44.7 % (ref 36.0–46.0)
Hemoglobin: 14.4 g/dL (ref 12.0–15.0)
MCH: 31.4 pg (ref 26.0–34.0)
MCHC: 32.2 g/dL (ref 30.0–36.0)
MCV: 97.6 fL (ref 80.0–100.0)
Platelets: 272 10*3/uL (ref 150–400)
RBC: 4.58 MIL/uL (ref 3.87–5.11)
RDW: 14 % (ref 11.5–15.5)
WBC: 7.7 10*3/uL (ref 4.0–10.5)
nRBC: 0 % (ref 0.0–0.2)

## 2021-01-26 DIAGNOSIS — F329 Major depressive disorder, single episode, unspecified: Secondary | ICD-10-CM | POA: Diagnosis not present

## 2021-01-26 DIAGNOSIS — E782 Mixed hyperlipidemia: Secondary | ICD-10-CM | POA: Diagnosis not present

## 2021-01-26 DIAGNOSIS — G894 Chronic pain syndrome: Secondary | ICD-10-CM | POA: Diagnosis not present

## 2021-01-26 DIAGNOSIS — E663 Overweight: Secondary | ICD-10-CM | POA: Diagnosis not present

## 2021-01-31 NOTE — Anesthesia Preprocedure Evaluation (Addendum)
Anesthesia Evaluation  Patient identified by MRN, date of birth, ID band Patient awake    Reviewed: Allergy & Precautions, NPO status , Patient's Chart, lab work & pertinent test results  History of Anesthesia Complications (+) PONV  Airway Mallampati: II  TM Distance: >3 FB Neck ROM: Full    Dental no notable dental hx. (+) Teeth Intact, Implants, Dental Advisory Given   Pulmonary asthma , former smoker,    Pulmonary exam normal breath sounds clear to auscultation       Cardiovascular Exercise Tolerance: Good negative cardio ROS Normal cardiovascular exam Rhythm:Regular Rate:Normal     Neuro/Psych  Headaches,    GI/Hepatic Neg liver ROS, GERD  ,  Endo/Other    Renal/GU Lab Results      Component                Value               Date                      CREATININE               0.62                01/25/2021                BUN                      15                  01/25/2021                NA                       140                 01/25/2021                K                        4.1                 01/25/2021                CL                       102                 01/25/2021                CO2                      31                  01/25/2021                Musculoskeletal  (+) Arthritis ,   Abdominal   Peds  Hematology Lab Results      Component                Value               Date                      WBC  7.7                 01/25/2021                HGB                      14.4                01/25/2021                HCT                      44.7                01/25/2021                MCV                      97.6                01/25/2021                PLT                      272                 01/25/2021              Anesthesia Other Findings All: Cephalexin, PCN  Reproductive/Obstetrics                            Anesthesia  Physical Anesthesia Plan  ASA: 2  Anesthesia Plan: Spinal and Regional   Post-op Pain Management: Minimal or no pain anticipated   Induction:   PONV Risk Score and Plan: 3 and Treatment may vary due to age or medical condition, Midazolam and Ondansetron  Airway Management Planned: Natural Airway and Simple Face Mask  Additional Equipment: None  Intra-op Plan:   Post-operative Plan:   Informed Consent: I have reviewed the patients History and Physical, chart, labs and discussed the procedure including the risks, benefits and alternatives for the proposed anesthesia with the patient or authorized representative who has indicated his/her understanding and acceptance.     Dental advisory given  Plan Discussed with:   Anesthesia Plan Comments: (Sp w L adductor canal)       Anesthesia Quick Evaluation

## 2021-02-01 ENCOUNTER — Encounter (HOSPITAL_COMMUNITY)
Admission: RE | Disposition: A | Discharge status: Home Health | Payer: Self-pay | Source: Home / Self Care | Attending: Orthopedic Surgery

## 2021-02-01 ENCOUNTER — Other Ambulatory Visit: Payer: Self-pay

## 2021-02-01 ENCOUNTER — Encounter (HOSPITAL_COMMUNITY): Payer: Self-pay | Admitting: Orthopedic Surgery

## 2021-02-01 ENCOUNTER — Inpatient Hospital Stay (HOSPITAL_COMMUNITY): Payer: PPO | Admitting: Anesthesiology

## 2021-02-01 ENCOUNTER — Inpatient Hospital Stay (HOSPITAL_COMMUNITY): Payer: PPO | Admitting: Physician Assistant

## 2021-02-01 ENCOUNTER — Inpatient Hospital Stay (HOSPITAL_COMMUNITY)
Admission: RE | Admit: 2021-02-01 | Discharge: 2021-02-02 | DRG: 468 | Disposition: A | Discharge status: Home Health | Payer: PPO | Attending: Orthopedic Surgery | Admitting: Orthopedic Surgery

## 2021-02-01 DIAGNOSIS — Z8051 Family history of malignant neoplasm of kidney: Secondary | ICD-10-CM

## 2021-02-01 DIAGNOSIS — F419 Anxiety disorder, unspecified: Secondary | ICD-10-CM | POA: Diagnosis not present

## 2021-02-01 DIAGNOSIS — T84093A Other mechanical complication of internal left knee prosthesis, initial encounter: Secondary | ICD-10-CM | POA: Diagnosis not present

## 2021-02-01 DIAGNOSIS — K219 Gastro-esophageal reflux disease without esophagitis: Secondary | ICD-10-CM | POA: Diagnosis not present

## 2021-02-01 DIAGNOSIS — Z96659 Presence of unspecified artificial knee joint: Secondary | ICD-10-CM

## 2021-02-01 DIAGNOSIS — Z7982 Long term (current) use of aspirin: Secondary | ICD-10-CM

## 2021-02-01 DIAGNOSIS — E785 Hyperlipidemia, unspecified: Secondary | ICD-10-CM | POA: Diagnosis not present

## 2021-02-01 DIAGNOSIS — J4540 Moderate persistent asthma, uncomplicated: Secondary | ICD-10-CM | POA: Diagnosis present

## 2021-02-01 DIAGNOSIS — Z800 Family history of malignant neoplasm of digestive organs: Secondary | ICD-10-CM | POA: Diagnosis not present

## 2021-02-01 DIAGNOSIS — Z801 Family history of malignant neoplasm of trachea, bronchus and lung: Secondary | ICD-10-CM

## 2021-02-01 DIAGNOSIS — Z8042 Family history of malignant neoplasm of prostate: Secondary | ICD-10-CM | POA: Diagnosis not present

## 2021-02-01 DIAGNOSIS — Z96652 Presence of left artificial knee joint: Secondary | ICD-10-CM | POA: Diagnosis not present

## 2021-02-01 DIAGNOSIS — Z79899 Other long term (current) drug therapy: Secondary | ICD-10-CM | POA: Diagnosis not present

## 2021-02-01 DIAGNOSIS — Z90710 Acquired absence of both cervix and uterus: Secondary | ICD-10-CM | POA: Diagnosis not present

## 2021-02-01 DIAGNOSIS — M25562 Pain in left knee: Secondary | ICD-10-CM | POA: Diagnosis present

## 2021-02-01 DIAGNOSIS — Z20822 Contact with and (suspected) exposure to covid-19: Secondary | ICD-10-CM | POA: Diagnosis present

## 2021-02-01 DIAGNOSIS — M1712 Unilateral primary osteoarthritis, left knee: Secondary | ICD-10-CM | POA: Diagnosis not present

## 2021-02-01 DIAGNOSIS — M179 Osteoarthritis of knee, unspecified: Secondary | ICD-10-CM

## 2021-02-01 DIAGNOSIS — Z87891 Personal history of nicotine dependence: Secondary | ICD-10-CM | POA: Diagnosis not present

## 2021-02-01 DIAGNOSIS — Z8249 Family history of ischemic heart disease and other diseases of the circulatory system: Secondary | ICD-10-CM

## 2021-02-01 DIAGNOSIS — T84033A Mechanical loosening of internal left knee prosthetic joint, initial encounter: Principal | ICD-10-CM | POA: Diagnosis present

## 2021-02-01 DIAGNOSIS — R110 Nausea: Secondary | ICD-10-CM | POA: Diagnosis not present

## 2021-02-01 DIAGNOSIS — E0590 Thyrotoxicosis, unspecified without thyrotoxic crisis or storm: Secondary | ICD-10-CM | POA: Diagnosis present

## 2021-02-01 DIAGNOSIS — Y792 Prosthetic and other implants, materials and accessory orthopedic devices associated with adverse incidents: Secondary | ICD-10-CM | POA: Diagnosis present

## 2021-02-01 DIAGNOSIS — Z833 Family history of diabetes mellitus: Secondary | ICD-10-CM

## 2021-02-01 DIAGNOSIS — G8918 Other acute postprocedural pain: Secondary | ICD-10-CM | POA: Diagnosis not present

## 2021-02-01 DIAGNOSIS — T84013A Broken internal left knee prosthesis, initial encounter: Secondary | ICD-10-CM | POA: Diagnosis not present

## 2021-02-01 DIAGNOSIS — F32A Depression, unspecified: Secondary | ICD-10-CM | POA: Diagnosis not present

## 2021-02-01 HISTORY — PX: CONVERSION TO TOTAL KNEE: SHX5785

## 2021-02-01 LAB — TYPE AND SCREEN
ABO/RH(D): O POS
Antibody Screen: NEGATIVE

## 2021-02-01 LAB — ABO/RH: ABO/RH(D): O POS

## 2021-02-01 LAB — SARS CORONAVIRUS 2 BY RT PCR (HOSPITAL ORDER, PERFORMED IN ~~LOC~~ HOSPITAL LAB): SARS Coronavirus 2: NEGATIVE

## 2021-02-01 SURGERY — CONVERSION, ARTHROPLASTY, KNEE, PARTIAL, TO TOTAL KNEE ARTHROPLASTY
Anesthesia: Regional | Site: Knee | Laterality: Left

## 2021-02-01 MED ORDER — PHENYLEPHRINE HCL-NACL 20-0.9 MG/250ML-% IV SOLN
INTRAVENOUS | Status: DC | PRN
  Administered 2021-02-01: 25 ug/min via INTRAVENOUS

## 2021-02-01 MED ORDER — ACETAMINOPHEN 10 MG/ML IV SOLN
INTRAVENOUS | Status: AC
  Filled 2021-02-01: qty 100

## 2021-02-01 MED ORDER — CLONIDINE HCL (ANALGESIA) 100 MCG/ML EP SOLN
EPIDURAL | Status: DC | PRN
  Administered 2021-02-01: 100 ug

## 2021-02-01 MED ORDER — DEXAMETHASONE SODIUM PHOSPHATE 10 MG/ML IJ SOLN
8.0000 mg | Freq: Once | INTRAMUSCULAR | Status: AC
  Administered 2021-02-01: 8 mg via INTRAVENOUS

## 2021-02-01 MED ORDER — PROPOFOL 500 MG/50ML IV EMUL
INTRAVENOUS | Status: DC | PRN
  Administered 2021-02-01: 50 ug/kg/min via INTRAVENOUS

## 2021-02-01 MED ORDER — SODIUM CHLORIDE 0.9 % IV SOLN
6.2500 mg | Freq: Four times a day (QID) | INTRAVENOUS | Status: DC | PRN
  Administered 2021-02-01: 6.25 mg via INTRAVENOUS
  Filled 2021-02-01: qty 0.25

## 2021-02-01 MED ORDER — METHOCARBAMOL 500 MG IVPB - SIMPLE MED
INTRAVENOUS | Status: AC
  Administered 2021-02-01: 500 mg via INTRAVENOUS
  Filled 2021-02-01: qty 50

## 2021-02-01 MED ORDER — DEXAMETHASONE SODIUM PHOSPHATE 10 MG/ML IJ SOLN
INTRAMUSCULAR | Status: AC
  Filled 2021-02-01: qty 1

## 2021-02-01 MED ORDER — OXYCODONE HCL 5 MG/5ML PO SOLN: 5.0000 mg | Freq: Once | ORAL | Status: AC | PRN

## 2021-02-01 MED ORDER — ACETAMINOPHEN 10 MG/ML IV SOLN
1000.0000 mg | Freq: Once | INTRAVENOUS | Status: DC | PRN
  Administered 2021-02-01: 1000 mg via INTRAVENOUS

## 2021-02-01 MED ORDER — VANCOMYCIN HCL IN DEXTROSE 1-5 GM/200ML-% IV SOLN
1000.0000 mg | Freq: Two times a day (BID) | INTRAVENOUS | Status: AC
  Administered 2021-02-01: 1000 mg via INTRAVENOUS
  Filled 2021-02-01 (×2): qty 200

## 2021-02-01 MED ORDER — PHENOL 1.4 % MT LIQD: 1.0000 | OROMUCOSAL | Status: DC | PRN

## 2021-02-01 MED ORDER — PROPOFOL 500 MG/50ML IV EMUL
INTRAVENOUS | Status: AC
  Filled 2021-02-01: qty 50

## 2021-02-01 MED ORDER — OXYCODONE HCL 5 MG PO TABS
ORAL_TABLET | ORAL | Status: AC
  Filled 2021-02-01: qty 1

## 2021-02-01 MED ORDER — PHENYLEPHRINE 40 MCG/ML (10ML) SYRINGE FOR IV PUSH (FOR BLOOD PRESSURE SUPPORT)
PREFILLED_SYRINGE | INTRAVENOUS | Status: AC
  Filled 2021-02-01: qty 10

## 2021-02-01 MED ORDER — ALPRAZOLAM 0.25 MG PO TABS: 0.2500 mg | ORAL_TABLET | Freq: Every day | ORAL | Status: DC | PRN

## 2021-02-01 MED ORDER — EPHEDRINE SULFATE-NACL 50-0.9 MG/10ML-% IV SOSY
PREFILLED_SYRINGE | INTRAVENOUS | Status: DC | PRN
Start: 2021-02-01 — End: 2021-02-01
  Administered 2021-02-01 (×2): 5 mg via INTRAVENOUS

## 2021-02-01 MED ORDER — HYDROMORPHONE HCL 1 MG/ML IJ SOLN
INTRAMUSCULAR | Status: AC
  Filled 2021-02-01: qty 1

## 2021-02-01 MED ORDER — BUPIVACAINE LIPOSOME 1.3 % IJ SUSP: 20.0000 mL | Freq: Once | INTRAMUSCULAR | Status: AC

## 2021-02-01 MED ORDER — SODIUM CHLORIDE (PF) 0.9 % IJ SOLN
INTRAMUSCULAR | Status: AC
  Filled 2021-02-01: qty 10

## 2021-02-01 MED ORDER — STERILE WATER FOR IRRIGATION IR SOLN
Status: DC | PRN
  Administered 2021-02-01: 2000 mL

## 2021-02-01 MED ORDER — DIPHENHYDRAMINE HCL 12.5 MG/5ML PO ELIX: 12.5000 mg | ORAL_SOLUTION | ORAL | Status: DC | PRN

## 2021-02-01 MED ORDER — METOCLOPRAMIDE HCL 5 MG/ML IJ SOLN
5.0000 mg | Freq: Three times a day (TID) | INTRAMUSCULAR | Status: DC | PRN
  Administered 2021-02-01: 10 mg via INTRAVENOUS
  Filled 2021-02-01: qty 2

## 2021-02-01 MED ORDER — DOCUSATE SODIUM 100 MG PO CAPS
100.0000 mg | ORAL_CAPSULE | Freq: Two times a day (BID) | ORAL | Status: DC
  Administered 2021-02-01 – 2021-02-02 (×2): 100 mg via ORAL
  Filled 2021-02-01 (×2): qty 1

## 2021-02-01 MED ORDER — VANCOMYCIN HCL 1500 MG/300ML IV SOLN
1500.0000 mg | Freq: Once | INTRAVENOUS | Status: AC
  Administered 2021-02-01: 1500 mg via INTRAVENOUS
  Filled 2021-02-01: qty 300

## 2021-02-01 MED ORDER — MONTELUKAST SODIUM 10 MG PO TABS
10.0000 mg | ORAL_TABLET | Freq: Every day | ORAL | Status: DC
  Administered 2021-02-01: 10 mg via ORAL
  Filled 2021-02-01: qty 1

## 2021-02-01 MED ORDER — PHENYLEPHRINE HCL (PRESSORS) 10 MG/ML IV SOLN
INTRAVENOUS | Status: AC
  Filled 2021-02-01: qty 1

## 2021-02-01 MED ORDER — ROPIVACAINE HCL 5 MG/ML IJ SOLN
INTRAMUSCULAR | Status: DC | PRN
  Administered 2021-02-01: 30 mL via PERINEURAL

## 2021-02-01 MED ORDER — METOCLOPRAMIDE HCL 5 MG PO TABS: 5.0000 mg | ORAL_TABLET | Freq: Three times a day (TID) | ORAL | Status: DC | PRN

## 2021-02-01 MED ORDER — POVIDONE-IODINE 10 % EX SWAB
2.0000 "application " | Freq: Once | CUTANEOUS | Status: AC
  Administered 2021-02-01: 2 via TOPICAL

## 2021-02-01 MED ORDER — ONDANSETRON HCL 4 MG PO TABS
4.0000 mg | ORAL_TABLET | Freq: Four times a day (QID) | ORAL | Status: DC | PRN
  Administered 2021-02-01: 4 mg via ORAL
  Filled 2021-02-01: qty 1

## 2021-02-01 MED ORDER — MORPHINE SULFATE (PF) 2 MG/ML IV SOLN: 0.5000 mg | INTRAVENOUS | Status: DC | PRN

## 2021-02-01 MED ORDER — LACTATED RINGERS IV SOLN: INTRAVENOUS | Status: DC

## 2021-02-01 MED ORDER — FENTANYL CITRATE (PF) 100 MCG/2ML IJ SOLN
INTRAMUSCULAR | Status: AC
  Filled 2021-02-01: qty 2

## 2021-02-01 MED ORDER — SODIUM CHLORIDE (PF) 0.9 % IJ SOLN
INTRAMUSCULAR | Status: DC | PRN
  Administered 2021-02-01: 60 mL

## 2021-02-01 MED ORDER — TRANEXAMIC ACID-NACL 1000-0.7 MG/100ML-% IV SOLN
1000.0000 mg | INTRAVENOUS | Status: AC
  Administered 2021-02-01: 1000 mg via INTRAVENOUS
  Filled 2021-02-01: qty 100

## 2021-02-01 MED ORDER — METHOCARBAMOL 500 MG PO TABS
500.0000 mg | ORAL_TABLET | Freq: Four times a day (QID) | ORAL | Status: DC | PRN
  Administered 2021-02-01 – 2021-02-02 (×2): 500 mg via ORAL
  Filled 2021-02-01 (×2): qty 1

## 2021-02-01 MED ORDER — ORAL CARE MOUTH RINSE: 15.0000 mL | Freq: Once | OROMUCOSAL | Status: AC

## 2021-02-01 MED ORDER — BISACODYL 10 MG RE SUPP: 10.0000 mg | Freq: Every day | RECTAL | Status: DC | PRN

## 2021-02-01 MED ORDER — ONDANSETRON HCL 4 MG/2ML IJ SOLN
INTRAMUSCULAR | Status: AC
  Filled 2021-02-01: qty 2

## 2021-02-01 MED ORDER — OXYCODONE HCL 5 MG PO TABS
5.0000 mg | ORAL_TABLET | Freq: Once | ORAL | Status: AC | PRN
  Administered 2021-02-01: 5 mg via ORAL

## 2021-02-01 MED ORDER — LORATADINE 10 MG PO TABS: 10.0000 mg | ORAL_TABLET | Freq: Every evening | ORAL | Status: DC

## 2021-02-01 MED ORDER — BUPIVACAINE LIPOSOME 1.3 % IJ SUSP
INTRAMUSCULAR | Status: AC
  Filled 2021-02-01: qty 20

## 2021-02-01 MED ORDER — FENTANYL CITRATE (PF) 100 MCG/2ML IJ SOLN
INTRAMUSCULAR | Status: DC | PRN
Start: 2021-02-01 — End: 2021-02-01
  Administered 2021-02-01 (×2): 50 ug via INTRAVENOUS

## 2021-02-01 MED ORDER — SODIUM CHLORIDE 0.9 % IV SOLN: INTRAVENOUS | Status: DC

## 2021-02-01 MED ORDER — MENTHOL 3 MG MT LOZG: 1.0000 | LOZENGE | OROMUCOSAL | Status: DC | PRN

## 2021-02-01 MED ORDER — MIDAZOLAM HCL 2 MG/2ML IJ SOLN
INTRAMUSCULAR | Status: DC | PRN
Start: 2021-02-01 — End: 2021-02-01
  Administered 2021-02-01 (×2): 1 mg via INTRAVENOUS

## 2021-02-01 MED ORDER — HYDROMORPHONE HCL 1 MG/ML IJ SOLN
0.2500 mg | INTRAMUSCULAR | Status: DC | PRN
  Administered 2021-02-01: 0.25 mg via INTRAVENOUS

## 2021-02-01 MED ORDER — METHOCARBAMOL 500 MG IVPB - SIMPLE MED
500.0000 mg | Freq: Four times a day (QID) | INTRAVENOUS | Status: DC | PRN
  Filled 2021-02-01: qty 50

## 2021-02-01 MED ORDER — AMISULPRIDE (ANTIEMETIC) 5 MG/2ML IV SOLN: 10.0000 mg | Freq: Once | INTRAVENOUS | Status: DC | PRN

## 2021-02-01 MED ORDER — BUPIVACAINE LIPOSOME 1.3 % IJ SUSP
INTRAMUSCULAR | Status: DC | PRN
  Administered 2021-02-01: 20 mL

## 2021-02-01 MED ORDER — OXYCODONE HCL 5 MG PO TABS: 5.0000 mg | ORAL_TABLET | ORAL | Status: DC | PRN

## 2021-02-01 MED ORDER — ZOLPIDEM TARTRATE 5 MG PO TABS
5.0000 mg | ORAL_TABLET | Freq: Every day | ORAL | Status: DC
  Administered 2021-02-01: 5 mg via ORAL
  Filled 2021-02-01: qty 1

## 2021-02-01 MED ORDER — LEVOCETIRIZINE DIHYDROCHLORIDE 5 MG PO TABS: 5.0000 mg | ORAL_TABLET | Freq: Every evening | ORAL | Status: DC

## 2021-02-01 MED ORDER — CHLORHEXIDINE GLUCONATE 0.12 % MT SOLN
15.0000 mL | Freq: Once | OROMUCOSAL | Status: AC
  Administered 2021-02-01: 15 mL via OROMUCOSAL

## 2021-02-01 MED ORDER — PROPOFOL 10 MG/ML IV BOLUS
INTRAVENOUS | Status: DC | PRN
Start: 2021-02-01 — End: 2021-02-01
  Administered 2021-02-01 (×2): 20 mg via INTRAVENOUS

## 2021-02-01 MED ORDER — CEFAZOLIN SODIUM-DEXTROSE 2-4 GM/100ML-% IV SOLN: 2.0000 g | INTRAVENOUS | Status: DC

## 2021-02-01 MED ORDER — ONDANSETRON HCL 4 MG/2ML IJ SOLN: 4.0000 mg | Freq: Four times a day (QID) | INTRAMUSCULAR | Status: DC | PRN

## 2021-02-01 MED ORDER — TRAMADOL HCL 50 MG PO TABS
50.0000 mg | ORAL_TABLET | Freq: Four times a day (QID) | ORAL | Status: DC | PRN
  Administered 2021-02-01: 50 mg via ORAL
  Filled 2021-02-01: qty 1

## 2021-02-01 MED ORDER — ATORVASTATIN CALCIUM 10 MG PO TABS
10.0000 mg | ORAL_TABLET | Freq: Every day | ORAL | Status: DC
  Administered 2021-02-02: 10 mg via ORAL
  Filled 2021-02-01: qty 1

## 2021-02-01 MED ORDER — ONDANSETRON HCL 4 MG/2ML IJ SOLN
INTRAMUSCULAR | Status: DC | PRN
Start: 2021-02-01 — End: 2021-02-01
  Administered 2021-02-01: 4 mg via INTRAVENOUS

## 2021-02-01 MED ORDER — PHENYLEPHRINE 40 MCG/ML (10ML) SYRINGE FOR IV PUSH (FOR BLOOD PRESSURE SUPPORT)
PREFILLED_SYRINGE | INTRAVENOUS | Status: DC | PRN
  Administered 2021-02-01 (×5): 80 ug via INTRAVENOUS

## 2021-02-01 MED ORDER — BUPIVACAINE IN DEXTROSE 0.75-8.25 % IT SOLN
INTRATHECAL | Status: DC | PRN
Start: 2021-02-01 — End: 2021-02-01
  Administered 2021-02-01: 12 mg via INTRATHECAL

## 2021-02-01 MED ORDER — MIDAZOLAM HCL 2 MG/2ML IJ SOLN
INTRAMUSCULAR | Status: AC
  Filled 2021-02-01: qty 2

## 2021-02-01 MED ORDER — PROPRANOLOL HCL 20 MG PO TABS
20.0000 mg | ORAL_TABLET | Freq: Every day | ORAL | Status: DC
  Administered 2021-02-01: 20 mg via ORAL
  Filled 2021-02-01: qty 1

## 2021-02-01 MED ORDER — HYDROCODONE-ACETAMINOPHEN 5-325 MG PO TABS
1.0000 | ORAL_TABLET | ORAL | Status: DC | PRN
  Administered 2021-02-01 – 2021-02-02 (×3): 1 via ORAL
  Filled 2021-02-01 (×3): qty 1

## 2021-02-01 MED ORDER — ACETAMINOPHEN 325 MG PO TABS
325.0000 mg | ORAL_TABLET | Freq: Four times a day (QID) | ORAL | Status: DC | PRN
  Administered 2021-02-02: 650 mg via ORAL
  Filled 2021-02-01: qty 2

## 2021-02-01 MED ORDER — EPHEDRINE 5 MG/ML INJ
INTRAVENOUS | Status: AC
  Filled 2021-02-01: qty 5

## 2021-02-01 MED ORDER — SODIUM CHLORIDE 0.9 % IR SOLN
Status: DC | PRN
  Administered 2021-02-01: 1000 mL

## 2021-02-01 MED ORDER — POLYETHYLENE GLYCOL 3350 17 G PO PACK: 17.0000 g | PACK | Freq: Every day | ORAL | Status: DC | PRN

## 2021-02-01 MED ORDER — ASPIRIN EC 325 MG PO TBEC
325.0000 mg | DELAYED_RELEASE_TABLET | Freq: Two times a day (BID) | ORAL | Status: DC
  Administered 2021-02-01 – 2021-02-02 (×2): 325 mg via ORAL
  Filled 2021-02-01 (×3): qty 1

## 2021-02-01 MED ORDER — ONDANSETRON HCL 4 MG/2ML IJ SOLN
4.0000 mg | Freq: Once | INTRAMUSCULAR | Status: AC | PRN
  Administered 2021-02-01: 4 mg via INTRAVENOUS

## 2021-02-01 MED ORDER — ACETAMINOPHEN 10 MG/ML IV SOLN
1000.0000 mg | Freq: Four times a day (QID) | INTRAVENOUS | Status: DC
  Administered 2021-02-01: 1000 mg via INTRAVENOUS
  Filled 2021-02-01: qty 100

## 2021-02-01 MED ORDER — 0.9 % SODIUM CHLORIDE (POUR BTL) OPTIME
TOPICAL | Status: DC | PRN
Start: 2021-02-01 — End: 2021-02-01
  Administered 2021-02-01: 1000 mL

## 2021-02-01 MED ORDER — PREGABALIN 100 MG PO CAPS
300.0000 mg | ORAL_CAPSULE | Freq: Every evening | ORAL | Status: DC
  Administered 2021-02-01: 300 mg via ORAL
  Filled 2021-02-01: qty 3

## 2021-02-01 MED ORDER — PANTOPRAZOLE SODIUM 40 MG PO TBEC
40.0000 mg | DELAYED_RELEASE_TABLET | Freq: Every day | ORAL | Status: DC
  Administered 2021-02-02: 40 mg via ORAL
  Filled 2021-02-01: qty 1

## 2021-02-01 MED ORDER — FLEET ENEMA 7-19 GM/118ML RE ENEM: 1.0000 | ENEMA | Freq: Once | RECTAL | Status: DC | PRN

## 2021-02-01 MED ORDER — DEXAMETHASONE SODIUM PHOSPHATE 10 MG/ML IJ SOLN: 10.0000 mg | Freq: Once | INTRAMUSCULAR | Status: DC

## 2021-02-01 SURGICAL SUPPLY — 58 items
ATTUNE MED DOME PAT 32 KNEE (Knees) ×1 IMPLANT
ATTUNE PS FEM LT SZ 4 CEM KNEE (Femur) ×1 IMPLANT
ATTUNE PSRP INSR SZ4 16 KNEE (Insert) ×1 IMPLANT
BAG COUNTER SPONGE SURGICOUNT (BAG) IMPLANT
BAG SPEC THK2 15X12 ZIP CLS (MISCELLANEOUS) ×1
BAG SPNG CNTER NS LX DISP (BAG)
BAG ZIPLOCK 12X15 (MISCELLANEOUS) ×2 IMPLANT
BASEPLATE TIBIAL ROTATING SZ 4 (Knees) ×1 IMPLANT
BLADE SAG 18X100X1.27 (BLADE) ×2 IMPLANT
BLADE SAW SGTL 11.0X1.19X90.0M (BLADE) ×2 IMPLANT
BNDG ELASTIC 4X5.8 VLCR STR LF (GAUZE/BANDAGES/DRESSINGS) ×2 IMPLANT
BNDG ELASTIC 6X5.8 VLCR STR LF (GAUZE/BANDAGES/DRESSINGS) ×1 IMPLANT
BOWL SMART MIX CTS (DISPOSABLE) ×2 IMPLANT
BSPLAT TIB 4 CMNT ROT PLAT STR (Knees) ×1 IMPLANT
CEMENT HV SMART SET (Cement) ×4 IMPLANT
COVER SURGICAL LIGHT HANDLE (MISCELLANEOUS) ×2 IMPLANT
CUFF TOURN SGL QUICK 34 (TOURNIQUET CUFF) ×2
CUFF TRNQT CYL 34X4.125X (TOURNIQUET CUFF) ×1 IMPLANT
DRAPE INCISE IOBAN 66X45 STRL (DRAPES) ×3 IMPLANT
DRAPE POUCH INSTRU U-SHP 10X18 (DRAPES) ×2 IMPLANT
DRAPE U-SHAPE 47X51 STRL (DRAPES) ×2 IMPLANT
DRSG ADAPTIC 3X8 NADH LF (GAUZE/BANDAGES/DRESSINGS) ×2 IMPLANT
DRSG AQUACEL AG ADV 3.5X10 (GAUZE/BANDAGES/DRESSINGS) ×1 IMPLANT
DRSG PAD ABDOMINAL 8X10 ST (GAUZE/BANDAGES/DRESSINGS) ×1 IMPLANT
DURAPREP 26ML APPLICATOR (WOUND CARE) ×2 IMPLANT
ELECT REM PT RETURN 15FT ADLT (MISCELLANEOUS) ×2 IMPLANT
EVACUATOR 1/8 PVC DRAIN (DRAIN) ×2 IMPLANT
GAUZE SPONGE 4X4 12PLY STRL (GAUZE/BANDAGES/DRESSINGS) ×1 IMPLANT
GLOVE SRG 8 PF TXTR STRL LF DI (GLOVE) ×1 IMPLANT
GLOVE SURG ENC MOIS LTX SZ6.5 (GLOVE) ×4 IMPLANT
GLOVE SURG ENC MOIS LTX SZ8 (GLOVE) ×4 IMPLANT
GLOVE SURG POLYISO LF SZ7 (GLOVE) ×2 IMPLANT
GLOVE SURG UNDER POLY LF SZ7 (GLOVE) ×4 IMPLANT
GLOVE SURG UNDER POLY LF SZ8 (GLOVE) ×2
GOWN STRL REUS W/TWL LRG LVL3 (GOWN DISPOSABLE) ×6 IMPLANT
HANDPIECE INTERPULSE COAX TIP (DISPOSABLE) ×2
IMMOBILIZER KNEE 20 (SOFTGOODS) ×2
IMMOBILIZER KNEE 20 THIGH 36 (SOFTGOODS) ×1 IMPLANT
KIT TURNOVER KIT A (KITS) IMPLANT
MANIFOLD NEPTUNE II (INSTRUMENTS) ×2 IMPLANT
NS IRRIG 1000ML POUR BTL (IV SOLUTION) ×2 IMPLANT
PACK TOTAL KNEE CUSTOM (KITS) ×2 IMPLANT
PADDING CAST COTTON 6X4 STRL (CAST SUPPLIES) ×4 IMPLANT
PROTECTOR NERVE ULNAR (MISCELLANEOUS) ×2 IMPLANT
SET HNDPC FAN SPRY TIP SCT (DISPOSABLE) ×1 IMPLANT
STRIP CLOSURE SKIN 1/2X4 (GAUZE/BANDAGES/DRESSINGS) ×4 IMPLANT
SUCTION FRAZIER HANDLE 12FR (TUBING)
SUCTION TUBE FRAZIER 12FR DISP (TUBING) ×1 IMPLANT
SUT MNCRL AB 4-0 PS2 18 (SUTURE) ×2 IMPLANT
SUT PDS AB 1 CT1 27 (SUTURE) ×6 IMPLANT
SUT STRATAFIX 0 PDS 27 VIOLET (SUTURE) ×2
SUT VIC AB 2-0 CT1 27 (SUTURE) ×6
SUT VIC AB 2-0 CT1 TAPERPNT 27 (SUTURE) ×3 IMPLANT
SUTURE STRATFX 0 PDS 27 VIOLET (SUTURE) ×1 IMPLANT
TOWEL OR 17X26 10 PK STRL BLUE (TOWEL DISPOSABLE) ×2 IMPLANT
TRAY FOLEY MTR SLVR 16FR STAT (SET/KITS/TRAYS/PACK) ×2 IMPLANT
WATER STERILE IRR 1000ML POUR (IV SOLUTION) ×3 IMPLANT
WRAP KNEE MAXI GEL POST OP (GAUZE/BANDAGES/DRESSINGS) ×3 IMPLANT

## 2021-02-01 NOTE — Brief Op Note (Signed)
02/01/2021  9:32 AM  PATIENT:  Joyce Herrera  61 y.o. female  PRE-OPERATIVE DIAGNOSIS:  Failed left knee unicompartmental arthroplasty  POST-OPERATIVE DIAGNOSIS:  Failed left knee unicompartmental arthroplasty  PROCEDURE:  Procedure(s): Revision left knee unicompartmental arthroplasty to total knee arthroplasty (Left)  SURGEON:  Surgeon(s) and Role:    Gaynelle Arabian, MD - Primary  PHYSICIAN ASSISTANT:   ASSISTANTS: Fenton Foy, PA-C   ANESTHESIA:    Adductor canal block and spinal  EBL:  25 mL   BLOOD ADMINISTERED:none  DRAINS: none   LOCAL MEDICATIONS USED:  OTHER Exparel  COUNTS:  YES  TOURNIQUET:  39 minutes @ 300 mm Hg  DICTATION: .Other Dictation: Dictation Number [id number]  PLAN OF CARE: Admit for overnight observation  PATIENT DISPOSITION:  PACU - hemodynamically stable.

## 2021-02-01 NOTE — Plan of Care (Signed)

## 2021-02-01 NOTE — Interval H&P Note (Signed)
History and Physical Interval Note:  02/01/2021 6:27 AM  Rudolpho Sevin  has presented today for surgery, with the diagnosis of Failed left knee unicompartmental arthroplasty.  The various methods of treatment have been discussed with the patient and family. After consideration of risks, benefits and other options for treatment, the patient has consented to  Procedure(s): Revision left knee unicompartmental arthroplasty to total knee arthroplasty (Left) as a surgical intervention.  The patient's history has been reviewed, patient examined, no change in status, stable for surgery.  I have reviewed the patient's chart and labs.  Questions were answered to the patient's satisfaction.     Pilar Plate Legend [place]

## 2021-02-01 NOTE — Anesthesia Procedure Notes (Signed)
Anesthesia Regional Block: Adductor canal block   Pre-Anesthetic Checklist: , timeout performed,  Correct Patient, Correct Site, Correct Laterality,  Correct Procedure, Correct Position, site marked,  Risks and benefits discussed,  Surgical consent,  Pre-op evaluation,  At surgeon's request and post-op pain management  Laterality: Lower and Left  Prep: chloraprep       Needles:  Injection technique: Single-shot  Needle Type: Echogenic Needle     Needle Length: 9cm  Needle Gauge: 22     Additional Needles:   Procedures:,,,, ultrasound used (permanent image in chart),,    Narrative:  Start time: 02/01/2021 7:53 AM End time: 02/01/2021 7:59 AM Injection made incrementally with aspirations every 5 mL.  Performed by: Personally  Anesthesiologist: Barnet Glasgow, MD  Additional Notes: Block assessed prior to surgery. Pt tolerated procedure well.

## 2021-02-01 NOTE — Anesthesia Procedure Notes (Addendum)
Spinal  Patient location during procedure: OR Start time: 02/01/2021 8:17 AM End time: 02/01/2021 8:21 AM Reason for block: surgical anesthesia Staffing Performed: anesthesiologist  Anesthesiologist: Barnet Glasgow, MD Preanesthetic Checklist Completed: patient identified, IV checked, risks and benefits discussed, surgical consent, monitors and equipment checked, pre-op evaluation and timeout performed Spinal Block Patient position: sitting Prep: DuraPrep and site prepped and draped Patient monitoring: heart rate, cardiac monitor, continuous pulse ox and blood pressure Approach: midline Location: L3-4 Injection technique: single-shot Needle Needle type: Pencan  Needle gauge: 24 G Needle length: 10 cm Needle insertion depth: 6 cm Assessment Sensory level: T4 Events: CSF return Additional Notes  1 Attempt (s). Pt tolerated procedure well.

## 2021-02-01 NOTE — Progress Notes (Signed)
Orthopedic Tech Progress Note Patient Details:  Joyce Herrera 1960/05/12 [id number]  Patient ID: Joyce Herrera, female   DOB: Jun 22, 1960, 62 y.o.   MRN: [id number]  Joyce Herrera 02/01/2021, 10:51 AM Cpm applied in PACU

## 2021-02-01 NOTE — Op Note (Signed)
NAME: MERIAL, Joyce Herrera. MEDICAL RECORD NO: [id number] ACCOUNT NO: [id number] DATE OF BIRTH: November 10, 1960 FACILITY: [facility] LOCATION: [facility] PHYSICIAN: Dione Plover. Susan Bleich, MD  Operative Report   DATE OF PROCEDURE: 02/01/2021  PREOPERATIVE DIAGNOSIS:  Failed left knee unicompartmental arthroplasty.  POSTOPERATIVE DIAGNOSIS:  Failed left knee unicompartmental arthroplasty.  PROCEDURE:  Revision of left knee unicompartmental arthroplasty to total knee arthroplasty.  SURGEON:  Dione Plover. Denina Rieger, MD.  ASSISTANTFenton Foy, PA-C.  ANESTHESIA:  Adductor canal block and spinal.  ESTIMATED BLOOD LOSS:  25 mL  DRAINS:  None.  TOURNIQUET TIME:  39 minutes at 300 mmHg.  COMPLICATIONS:  None.  CONDITION:  Stable to recovery.  BRIEF CLINICAL NOTE:  The patient is a 61 year old female who had a left knee medial unicompartmental arthroplasty done several years ago.  She presented with pain and instability.  She had a bone scan, which showed loosening of the implant.  She also  had some secondary degenerative changes.  She presents now for revision to a total knee arthroplasty.  DESCRIPTION OF PROCEDURE:  After successful administration of an adductor canal block and spinal anesthetic exam under anesthesia was performed.  She has a fair amount of varus valgus laxity in full extension.  Range of motion 0 to about 135.  There is  minimal AP laxity.  The tourniquet was then placed on the left thigh and left lower extremity prepped and draped in the usual sterile fashion.  Midline incision was made with a 10 blade through the subcutaneous tissue to the extensor mechanism.  A fresh  blade was used to make a medial parapatellar arthrotomy.  Soft tissue on the proximal medial tibia subperiosteally elevated to the joint line with a knife and into the semimembranosus bursa with a Cobb elevator.  Soft tissue laterally was elevated with  attention being paid to avoiding the patellar tendon or the tibial  tubercle.  Note that there was no evidence of any inflamed synovium and we did not encounter any fluid in the joint.  She has Oxford mobile bearing unicompartmental replacement.  There is  a fair amount of laxity with the bearing.  I removed the bearing and there was some polyethylene wear noted.  We then removed the ACL and PCL as well as I also removed the lateral meniscus.  The femoral component has bone overgrowing it superiorly with  some degenerative changes present superior to the femoral component.  There is some mild patellofemoral degeneration also.  Osteotome was used to remove the femoral component, which is removed easily with essentially no bone loss.  I also used the  osteotomes to remove the tibial component, which is also removed without bone loss, but there was a little better fixed than the femoral component was.  Drill was used to create a starting hole in the distal femur and a canal was thoroughly irrigated.  A 5-degree left valgus alignment guide was placed and the block is pinned to remove 9 mm off the distal femur.  This got well beyond the defect medially.   Resection was made with an oscillating saw.  The tibia subluxed forward.  Retraction was placed around the tibia.  Then, the extramedullary tibial alignment guide placed. The rotation is proximally at the medial aspect of the tibial tubercle and distally along the second metatarsal axis and tibial  crest.  Block was pinned to remove 2 mm off the more deficient medial side.  Tibial resection was made with an oscillating saw.  Size 4 is  the most appropriate tibial component and a proximal tibia was prepared with the modular drill and keel punch for  the size 4.  The femoral sizer was placed and the rotation was marked off the epicondylar axis.  Size 4 was the most appropriate size for the femoral component.  Block is pinned in this rotation and the anterior, posterior and chamfer cuts made.  Intercondylar block  was then  placed and neck cuts made.  Trial size 4 femoral component for the Attune was placed.  She had a large gap due to the removal of the components.  A 16 mm insert was placed and full extension was achieved with excellent varus, valgus, and  anterior, posterior balance throughout full range of motion.  Patella was then everted, thickness measured to be 20 mm.  Freehand resection was taken to 12 mm.  A 32 template was placed, lug holes were drilled, trial patella was placed in the tracks  normally.  The trials were removed and then the osteophytes were removed off the posterior femur with the femoral trial in place.  The trials were removed.  The Exparel 20 mL mixed with saline 60 mL was injected into the posterior capsule.  The medial  aspect of the extensor mechanism.  The periosteum of the femur and subcutaneous tissues.  The cut bone surfaces were then prepared with pulsatile lavage.  Cement was mixed and once ready for implantation the size 4 tibial tray, the size 4 posterior  stabilized femur, and 32 patella were cemented into place and patella was held with the clamp.  Trial 16 mm insert was placed.  Knee held in full extension.  All extruded cement removed.  When the cement was fully hardened and then the permanent 16 mm  posterior stabilized rotating platform insert was placed into the tibial tray.  There is great stability throughout full range of motion.  Wound was further irrigated and then the arthrotomy was closed with a running 0 Stratafix suture.  Tourniquet was  released total time of 39 minutes.  Flexion against gravity was 140 degrees with the patella tracking normally.  Subcutaneous was then closed with interrupted 2-0 Vicryl and subcuticular running 4-0 Monocryl.  The incision was cleaned and dried and  Steri-Strips and a sterile dressing applied.  She was placed into a knee immobilizer, awakened, and transported to recovery in stable condition.   PUS D: 02/01/2021 9:41:09 am T:  02/01/2021 10:20:00 am  JOB: [id number]/ [id number]

## 2021-02-01 NOTE — Discharge Instructions (Addendum)
Joyce Arabian, MD Total Joint Specialist [facility] [address], [address] [place], [place] [address] [phone]  TOTAL KNEE REPLACEMENT POSTOPERATIVE DIRECTIONS    Knee Rehabilitation, Guidelines Following Surgery  Results after knee surgery are often greatly improved when you follow the exercise, range of motion and muscle strengthening exercises prescribed by your doctor. Safety measures are also important to protect the knee from further injury. If any of these exercises cause you to have increased pain or swelling in your knee joint, decrease the amount until you are comfortable again and slowly increase them. If you have problems or questions, call your caregiver or physical therapist for advice.   BLOOD CLOT PREVENTION Take a 325 mg Aspirin two times a day for three weeks following surgery. Then resume one 81 mg Aspirin once a day. You may resume your vitamins/supplements upon discharge from the hospital. Do not take any NSAIDs (Advil, Aleve, Ibuprofen, Meloxicam, etc.) until you have discontinued the 325 mg Aspirin.  HOME CARE INSTRUCTIONS  Remove items at home which could result in a fall. This includes throw rugs or furniture in walking pathways.  ICE to the affected knee as much as tolerated. Icing helps control swelling. If the swelling is well controlled you will be more comfortable and rehab easier. Continue to use ice on the knee for pain and swelling from surgery. You may notice swelling that will progress down to the foot and ankle. This is normal after surgery. Elevate the leg when you are not up walking on it.    Continue to use the breathing machine which will help keep your temperature down. It is common for your temperature to cycle up and down following surgery, especially at night when you are not up moving around and exerting yourself. The breathing machine keeps your lungs expanded and your temperature down. Do not place pillow under the  operative knee, focus on keeping the knee straight while resting  DIET You may resume your previous home diet once you are discharged from the hospital.  DRESSING / WOUND CARE / SHOWERING Keep your bulky bandage on for 2 days. On the third post-operative day you may remove the Ace bandage and gauze. There is a waterproof adhesive bandage on your skin which will stay in place until your first follow-up appointment. Once you remove this you will not need to place another bandage You may begin showering 3 days following surgery, but do not submerge the incision under water.  ACTIVITY For the first 5 days, the key is rest and control of pain and swelling Do your home exercises twice a day starting on post-operative day 3. On the days you go to physical therapy, just do the home exercises once that day. You should rest, ice and elevate the leg for 50 minutes out of every hour. Get up and walk/stretch for 10 minutes per hour. After 5 days you can increase your activity slowly as tolerated. Walk with your walker as instructed. Use the walker until you are comfortable transitioning to a cane. Walk with the cane in the opposite hand of the operative leg. You may discontinue the cane once you are comfortable and walking steadily. Avoid periods of inactivity such as sitting longer than an hour when not asleep. This helps prevent blood clots.  You may discontinue the knee immobilizer once you are able to perform a straight leg raise while lying down. You may resume a sexual relationship in one month or when given the OK by  your doctor.  You may return to work once you are cleared by your doctor.  Do not drive a car for 6 weeks or until released by your surgeon.  Do not drive while taking narcotics.  TED HOSE STOCKINGS Wear the elastic stockings on both legs for three weeks following surgery during the day. You may remove them at night for sleeping.  WEIGHT BEARING Weight bearing as tolerated with assist  device (walker, cane, etc) as directed, use it as long as suggested by your surgeon or therapist, typically at least 4-6 weeks.  POSTOPERATIVE CONSTIPATION PROTOCOL Constipation - defined medically as fewer than three stools per week and severe constipation as less than one stool per week.  One of the most common issues patients have following surgery is constipation.  Even if you have a regular bowel pattern at home, your normal regimen is likely to be disrupted due to multiple reasons following surgery.  Combination of anesthesia, postoperative narcotics, change in appetite and fluid intake all can affect your bowels.  In order to avoid complications following surgery, here are some recommendations in order to help you during your recovery period.  Colace (docusate) - Pick up an over-the-counter form of Colace or another stool softener and take twice a day as long as you are requiring postoperative pain medications.  Take with a full glass of water daily.  If you experience loose stools or diarrhea, hold the colace until you stool forms back up. If your symptoms do not get better within 1 week or if they get worse, check with your doctor. Dulcolax (bisacodyl) - Pick up over-the-counter and take as directed by the product packaging as needed to assist with the movement of your bowels.  Take with a full glass of water.  Use this product as needed if not relieved by Colace only.  MiraLax (polyethylene glycol) - Pick up over-the-counter to have on hand. MiraLax is a solution that will increase the amount of water in your bowels to assist with bowel movements.  Take as directed and can mix with a glass of water, juice, soda, coffee, or tea. Take if you go more than two days without a movement. Do not use MiraLax more than once per day. Call your doctor if you are still constipated or irregular after using this medication for 7 days in a row.  If you continue to have problems with postoperative constipation,  please contact the office for further assistance and recommendations.  If you experience "the worst abdominal pain ever" or develop nausea or vomiting, please contact the office immediatly for further recommendations for treatment.  ITCHING If you experience itching with your medications, try taking only a single pain pill, or even half a pain pill at a time.  You can also use Benadryl over the counter for itching or also to help with sleep.   MEDICATIONS See your medication summary on the After Visit Summary that the nursing staff will review with you prior to discharge.  You may have some home medications which will be placed on hold until you complete the course of blood thinner medication.  It is important for you to complete the blood thinner medication as prescribed by your surgeon.  Continue your approved medications as instructed at time of discharge.  PRECAUTIONS If you experience chest pain or shortness of breath - call 911 immediately for transfer to the hospital emergency department.  If you develop a fever greater that 101 F, purulent drainage from wound, increased redness  or drainage from wound, foul odor from the wound/dressing, or calf pain - CONTACT YOUR SURGEON.                                                   FOLLOW-UP APPOINTMENTS Make sure you keep all of your appointments after your operation with your surgeon and caregivers. You should call the office at the above phone number and make an appointment for approximately two weeks after the date of your surgery or on the date instructed by your surgeon outlined in the "After Visit Summary".  RANGE OF MOTION AND STRENGTHENING EXERCISES  Rehabilitation of the knee is important following a knee injury or an operation. After just a few days of immobilization, the muscles of the thigh which control the knee become weakened and shrink (atrophy). Knee exercises are designed to build up the tone and strength of the thigh muscles and to  improve knee motion. Often times heat used for twenty to thirty minutes before working out will loosen up your tissues and help with improving the range of motion but do not use heat for the first two weeks following surgery. These exercises can be done on a training (exercise) mat, on the floor, on a table or on a bed. Use what ever works the best and is most comfortable for you Knee exercises include:  Leg Lifts - While your knee is still immobilized in a splint or cast, you can do straight leg raises. Lift the leg to 60 degrees, hold for 3 sec, and slowly lower the leg. Repeat 10-20 times 2-3 times daily. Perform this exercise against resistance later as your knee gets better.  Quad and Hamstring Sets - Tighten up the muscle on the front of the thigh (Quad) and hold for 5-10 sec. Repeat this 10-20 times hourly. Hamstring sets are done by pushing the foot backward against an object and holding for 5-10 sec. Repeat as with quad sets.  Leg Slides: Lying on your back, slowly slide your foot toward your buttocks, bending your knee up off the floor (only go as far as is comfortable). Then slowly slide your foot back down until your leg is flat on the floor again. Angel Wings: Lying on your back spread your legs to the side as far apart as you can without causing discomfort.  A rehabilitation program following serious knee injuries can speed recovery and prevent re-injury in the future due to weakened muscles. Contact your doctor or a physical therapist for more information on knee rehabilitation.   POST-OPERATIVE OPIOID TAPER INSTRUCTIONS: It is important to wean off of your opioid medication as soon as possible. If you do not need pain medication after your surgery it is ok to stop day one. Opioids include: Codeine, Hydrocodone(Norco, Vicodin), Oxycodone(Percocet, oxycontin) and hydromorphone amongst others.  Long term and even short term use of opiods can cause: Increased pain  response Dependence Constipation Depression Respiratory depression And more.  Withdrawal symptoms can include Flu like symptoms Nausea, vomiting And more Techniques to manage these symptoms Hydrate well Eat regular healthy meals Stay active Use relaxation techniques(deep breathing, meditating, yoga) Do Not substitute Alcohol to help with tapering If you have been on opioids for less than two weeks and do not have pain than it is ok to stop all together.  Plan to wean off of opioids This  plan should start within one week post op of your joint replacement. Maintain the same interval or time between taking each dose and first decrease the dose.  Cut the total daily intake of opioids by one tablet each day Next start to increase the time between doses. The last dose that should be eliminated is the evening dose.   IF YOU ARE TRANSFERRED TO A SKILLED REHAB FACILITY If the patient is transferred to a skilled rehab facility following release from the hospital, a list of the current medications will be sent to the facility for the patient to continue.  When discharged from the skilled rehab facility, please have the facility set up the patient's [place] prior to being released. Also, the skilled facility will be responsible for providing the patient with their medications at time of release from the facility to include their pain medication, the muscle relaxants, and their blood thinner medication. If the patient is still at the rehab facility at time of the two week follow up appointment, the skilled rehab facility will also need to assist the patient in arranging follow up appointment in our office and any transportation needs.  MAKE SURE YOU:  Understand these instructions.  Get help right away if you are not doing well or get worse.   DENTAL ANTIBIOTICS:  In most cases prophylactic antibiotics for Dental procdeures after total joint surgery are not  necessary.  Exceptions are as follows:  1. History of prior total joint infection  2. Severely immunocompromised (Organ Transplant, cancer chemotherapy, Rheumatoid biologic meds such as [place])  3. Poorly controlled diabetes (A1C &gt; 8.0, blood glucose over 200)  If you have one of these conditions, contact your surgeon for an antibiotic prescription, prior to your dental procedure.    Pick up stool softner and laxative for home use following surgery while on pain medications. Do not submerge incision under water. Please use good hand washing techniques while changing dressing each day. May shower starting three days after surgery. Please use a clean towel to pat the incision dry following showers. Continue to use ice for pain and swelling after surgery. Do not use any lotions or creams on the incision until instructed by your surgeon.

## 2021-02-01 NOTE — Anesthesia Postprocedure Evaluation (Signed)
Anesthesia Post Note  Patient: Joyce Herrera [facility]  Procedure(s) Performed: Revision left knee unicompartmental arthroplasty to total knee arthroplasty (Left: Knee)     Patient location during evaluation: Nursing Unit Anesthesia Type: Regional and Spinal Level of consciousness: oriented and awake and alert Pain management: pain level controlled Vital Signs Assessment: post-procedure vital signs reviewed and stable Respiratory status: spontaneous breathing and respiratory function stable Cardiovascular status: blood pressure returned to baseline and stable Postop Assessment: no headache, no backache, no apparent nausea or vomiting and patient able to bend at knees Anesthetic complications: no   No notable events documented.  Last Vitals:  Vitals:   02/01/21 1415 02/01/21 1436  BP: 103/73 100/82  Pulse: 76 76  Resp: 11 15  Temp:  (!) 36.4 C  SpO2: 99% 100%    Last Pain:  Vitals:   02/01/21 1451  TempSrc:   PainSc: 4                  [place]et [place]

## 2021-02-01 NOTE — Anesthesia Procedure Notes (Signed)
Procedure Name: MAC Date/Time: 02/01/2021 8:18 AM Performed by: Lollie Sails, CRNA Pre-anesthesia Checklist: Patient identified, Emergency Drugs available, Suction available, Patient being monitored and Timeout performed Oxygen Delivery Method: Simple face mask Placement Confirmation: positive ETCO2

## 2021-02-01 NOTE — Progress Notes (Signed)
Orthopedic Tech Progress Note Patient Details:  Joyce Herrera 02-Aug-1960 [id number]  Patient ID: Joyce Herrera, female   DOB: Feb 13, 1960, 61 y.o.   MRN: [id number]  [name] 02/01/2021, 2:39 PM Cpm removed

## 2021-02-01 NOTE — Transfer of Care (Signed)
Immediate Anesthesia Transfer of Care Note  Patient: Joyce Herrera [facility]  Procedure(s) Performed: Revision left knee unicompartmental arthroplasty to total knee arthroplasty (Left: Knee)  Patient Location: PACU  Anesthesia Type:Spinal and MAC combined with regional for post-op pain  Level of Consciousness: awake, alert  and patient cooperative  Airway & Oxygen Therapy: Patient Spontanous Breathing and Patient connected to face mask oxygen  Post-op Assessment: Report given to RN and Post -op Vital signs reviewed and stable  Post vital signs: Reviewed and stable  Last Vitals:  Vitals Value Taken Time  BP 137/103 02/01/21 1003  Temp    Pulse 92 02/01/21 1006  Resp 14 02/01/21 1006  SpO2 93 % 02/01/21 1006  Vitals shown include unvalidated device data.  Last Pain:  Vitals:   02/01/21 0631  TempSrc: Oral         Complications: No notable events documented.

## 2021-02-01 NOTE — Evaluation (Signed)
Physical Therapy Evaluation Patient Details Name: Joyce Herrera MRN: [id number] DOB: 07/27/1960 Today's Date: 02/01/2021  History of Present Illness  61 yo female s/p Revision left knee unicompartmental arthroplasty to total knee arthroplasty. PMH: L UKR, GERD  Clinical Impression  Pt is s/p TKA revision resulting in the deficits listed below (see PT Problem List).  Stand-step pivot bed to chair. Gait limited d/t nausea   Pt will benefit from skilled PT to increase their independence and safety with mobility to allow discharge to the venue listed below.          Recommendations for follow up therapy are one component of a multi-disciplinary discharge planning process, led by the attending physician.  Recommendations may be updated based on patient status, additional functional criteria and insurance authorization.  Follow Up Recommendations Follow physician's recommendations for discharge plan and follow up therapies    Assistance Recommended at Discharge Intermittent Supervision/Assistance  Patient can return home with the following  Help with stairs or ramp for entrance    Equipment Recommendations None recommended by PT  Recommendations for Other Services       Functional Status Assessment Patient has had a recent decline in their functional status and demonstrates the ability to make significant improvements in function in a reasonable and predictable amount of time.     Precautions / Restrictions Precautions Precautions: Fall;Knee Required Braces or Orthoses: Knee Immobilizer - Left Knee Immobilizer - Left: Discontinue once straight leg raise with < 10 degree lag Restrictions Weight Bearing Restrictions: No Other Position/Activity Restrictions: WBAT--verbal-per Dr Wynelle Link      Mobility  Bed Mobility Overal bed mobility: Needs Assistance Bed Mobility: Supine to Sit     Supine to sit: Min assist     General bed mobility comments: assist with LLE., incr time     Transfers Overall transfer level: Needs assistance Equipment used: Rolling walker (2 wheels) Transfers: Sit to/from Stand;Bed to chair/wheelchair/BSC Sit to Stand: Min assist   Step pivot transfers: Min guard;Min assist       General transfer comment: light assist to rise and transition to RW, cues for hand placement and LL Eposition; cues for RW position for stand step xfer; limited by nausea    Ambulation/Gait                  Stairs            Wheelchair Mobility    Modified Rankin (Stroke Patients Only)       Balance                                             Pertinent Vitals/Pain Pain Assessment: 0-10 Pain Score: 5  Pain Location: left knee,HA Pain Descriptors / Indicators: Grimacing;Sore Pain Intervention(s): Limited activity within patient's tolerance;Monitored during session;Premedicated before session;Repositioned    Home Living Family/patient expects to be discharged to:: Private residence Living Arrangements: Spouse/significant other Available Help at Discharge: Family;Available 24 hours/day Type of Home: House Home Access: Stairs to enter   [employer] of Steps: 1   Home Layout: One level Home Equipment: Conservation officer, nature (2 wheels);BSC/3in1      Prior Function Prior Level of Function : Independent/Modified Independent                     Hand Dominance        Extremity/Trunk Assessment  Upper Extremity Assessment Upper Extremity Assessment: Overall WFL for tasks assessed    Lower Extremity Assessment Lower Extremity Assessment: LLE deficits/detail LLE Deficits / Details: ankle WFL, knee extension and hip flexion 2/5 LLE: Unable to fully assess due to pain       Communication   Communication: No difficulties  Cognition Arousal/Alertness: Awake/alert Behavior During Therapy: WFL for tasks assessed/performed Overall Cognitive Status: Within Functional Limits for tasks assessed                                           General Comments      Exercises Total Joint Exercises Ankle Circles/Pumps: AROM;Both;10 reps   Assessment/Plan    PT Assessment Patient needs continued PT services  PT Problem List Decreased strength;Decreased mobility;Decreased range of motion;Decreased activity tolerance;Decreased balance;Decreased knowledge of use of DME;Pain       PT Treatment Interventions DME instruction;Therapeutic activities;Gait training;Therapeutic exercise;Functional mobility training;Patient/family education;Stair training    PT Goals (Current goals can be found in the Care Plan section)  Acute Rehab PT Goals Patient Stated Goal: feel better PT Goal Formulation: With patient Time For Goal Achievement: 02/08/21 Potential to Achieve Goals: Good    Frequency 7X/week     Co-evaluation               AM-PAC PT "6 Clicks" Mobility  Outcome Measure Help needed turning from your back to your side while in a flat bed without using bedrails?: A Little Help needed moving from lying on your back to sitting on the side of a flat bed without using bedrails?: A Little Help needed moving to and from a bed to a chair (including a wheelchair)?: A Little Help needed standing up from a chair using your arms (e.g., wheelchair or bedside chair)?: A Lot Help needed to walk in hospital room?: A Lot Help needed climbing 3-5 steps with a railing? : A Lot 6 Click Score: 15    End of Session Equipment Utilized During Treatment: Gait belt Activity Tolerance: Patient tolerated treatment well Patient left: with call bell/phone within reach;in chair;with family/visitor present Nurse Communication: Mobility status PT Visit Diagnosis: Other abnormalities of gait and mobility (R26.89);Difficulty in walking, not elsewhere classified (R26.2)    Time: 1962-2297 PT Time Calculation (min) (ACUTE ONLY): 22 min   Charges:   PT Evaluation $PT Eval Low Complexity: [place], PT  Acute Rehab Dept ([place]) [phone] Pager [phone]  02/01/2021   [facility] 02/01/2021, 4:17 PM

## 2021-02-02 LAB — BASIC METABOLIC PANEL
Anion gap: 6 (ref 5–15)
BUN: 13 mg/dL (ref 8–23)
CO2: 28 mmol/L (ref 22–32)
Calcium: 8.8 mg/dL — ABNORMAL LOW (ref 8.9–10.3)
Chloride: 107 mmol/L (ref 98–111)
Creatinine, Ser: 0.66 mg/dL (ref 0.44–1.00)
GFR, Estimated: >60 mL/min (ref >60)
Glucose, Bld: 106 mg/dL — ABNORMAL HIGH (ref 70–99)
Potassium: 3.8 mmol/L (ref 3.5–5.1)
Sodium: 141 mmol/L (ref 135–145)

## 2021-02-02 LAB — CBC
HCT: 37.3 % (ref 36.0–46.0)
Hemoglobin: 12.1 g/dL (ref 12.0–15.0)
MCH: 31.3 pg (ref 26.0–34.0)
MCHC: 32.4 g/dL (ref 30.0–36.0)
MCV: 96.6 fL (ref 80.0–100.0)
Platelets: 289 10*3/uL (ref 150–400)
RBC: 3.86 MIL/uL — ABNORMAL LOW (ref 3.87–5.11)
RDW: 13.6 % (ref 11.5–15.5)
WBC: 11.7 10*3/uL — ABNORMAL HIGH (ref 4.0–10.5)
nRBC: 0 % (ref 0.0–0.2)

## 2021-02-02 MED ORDER — TRAMADOL HCL 50 MG PO TABS: 50.0000 mg | ORAL_TABLET | Freq: Four times a day (QID) | ORAL | 0 refills | Status: DC | PRN

## 2021-02-02 MED ORDER — HYDROCODONE-ACETAMINOPHEN 5-325 MG PO TABS
1.0000 | ORAL_TABLET | Freq: Four times a day (QID) | ORAL | 0 refills | Status: DC | PRN
Start: 2021-02-02 — End: 2021-05-12

## 2021-02-02 MED ORDER — ONDANSETRON HCL 4 MG PO TABS: 4.0000 mg | ORAL_TABLET | Freq: Four times a day (QID) | ORAL | 0 refills | Status: DC | PRN

## 2021-02-02 MED ORDER — ASPIRIN 325 MG PO TBEC: 325.0000 mg | DELAYED_RELEASE_TABLET | Freq: Two times a day (BID) | ORAL | 0 refills | Status: DC

## 2021-02-02 MED ORDER — METHOCARBAMOL 500 MG PO TABS: 500.0000 mg | ORAL_TABLET | Freq: Four times a day (QID) | ORAL | 0 refills | Status: DC | PRN

## 2021-02-02 NOTE — Progress Notes (Signed)
Physical Therapy Treatment Patient Details Name: Joyce Herrera MRN: [id number] DOB: 04-27-1960 Today's Date: 02/02/2021   History of Present Illness 61 yo female s/p Revision left knee unicompartmental arthroplasty to total knee arthroplasty. PMH: L UKR, GERD    PT Comments    Pt without nausea or dizziness, able to ambulate 80 ft in hallway with RW, limited only by IV site discomfort at wrist. Pt wears left KI with mobility due to quad weakness with SLR. Pt completes toileting with supv and able to release hands from RW to wash hands with good steadiness. Pt completes stair training of single step with RW, educated on sequencing and positioning within RW. Pt provided written/illustrated HEP, all questions answered; reports starting HHPT once home. Pt reports good family support and is motivated to return home, all education complete and RN notified pt is ready for d/c.   Recommendations for follow up therapy are one component of a multi-disciplinary discharge planning process, led by the attending physician.  Recommendations may be updated based on patient status, additional functional criteria and insurance authorization.  Follow Up Recommendations  Follow physician's recommendations for discharge plan and follow up therapies     Assistance Recommended at Discharge Intermittent Supervision/Assistance  Patient can return home with the following Help with stairs or ramp for entrance   Equipment Recommendations  None recommended by PT    Recommendations for Other Services       Precautions / Restrictions Precautions Precautions: Fall;Knee Required Braces or Orthoses: Knee Immobilizer - Left Knee Immobilizer - Left: Discontinue once straight leg raise with < 10 degree lag Restrictions Weight Bearing Restrictions: No Other Position/Activity Restrictions: WBAT--verbal-per Dr Wynelle Link     Mobility  Bed Mobility Overal bed mobility: Needs Assistance Bed Mobility: Supine to Sit   Supine to sit: Supervision   General bed mobility comments: increased time to mobilize LLE over to EOB, no physical assist    Transfers Overall transfer level: Needs assistance Equipment used: Rolling walker (2 wheels) Transfers: Sit to/from Stand Sit to Stand: Supervision  General transfer comment: supv to power to stand, VC for hand placement with initial rep and able to perform for following without cues    Ambulation/Gait Ambulation/Gait assistance: Supervision Gait Distance (Feet): 80 Feet Assistive device: Rolling walker (2 wheels) Gait Pattern/deviations: Step-to pattern;Decreased stride length;Decreased weight shift to left Gait velocity: decreased  General Gait Details: step to pattern with Lt KI, good steadiness, limited by IV site discomfort at wrist, denies dizziness and nausea   Stairs Stairs: Yes Stairs assistance: Min guard Stair Management: Forwards;With walker Number of Stairs: 2 General stair comments: single step x2 reps ascend/descend, verbal cues for sequencing (up with good, down with bad) and positioning in RW, no LOB   Wheelchair Mobility    Modified Rankin (Stroke Patients Only)       Balance Overall balance assessment: Needs assistance Sitting-balance support: Feet supported Sitting balance-Leahy Scale: Good  Standing balance support: During functional activity Standing balance-Leahy Scale: Fair Standing balance comment: able to release UEs to wash hands and don face mask     Cognition Arousal/Alertness: Awake/alert Behavior During Therapy: WFL for tasks assessed/performed Overall Cognitive Status: Within Functional Limits for tasks assessed      Exercises      General Comments        Pertinent Vitals/Pain Pain Assessment: 0-10 Pain Score: 3  Pain Location: Lt knee Pain Descriptors / Indicators: Sore Pain Intervention(s): Limited activity within patient's tolerance;Monitored during session;Premedicated before  session;Repositioned;Ice applied  Home Living                          Prior Function            PT Goals (current goals can now be found in the care plan section) Acute Rehab PT Goals Patient Stated Goal: feel better PT Goal Formulation: With patient Time For Goal Achievement: 02/08/21 Potential to Achieve Goals: Good Progress towards PT goals: Progressing toward goals    Frequency    7X/week      PT Plan Current plan remains appropriate    Co-evaluation              AM-PAC PT "6 Clicks" Mobility   Outcome Measure  Help needed turning from your back to your side while in a flat bed without using bedrails?: A Little Help needed moving from lying on your back to sitting on the side of a flat bed without using bedrails?: A Little Help needed moving to and from a bed to a chair (including a wheelchair)?: A Little Help needed standing up from a chair using your arms (e.g., wheelchair or bedside chair)?: A Little Help needed to walk in hospital room?: A Little Help needed climbing 3-5 steps with a railing? : A Little 6 Click Score: 18    End of Session Equipment Utilized During Treatment: Gait belt Activity Tolerance: Patient tolerated treatment well Patient left: with call bell/phone within reach;in chair Nurse Communication: Mobility status PT Visit Diagnosis: Other abnormalities of gait and mobility (R26.89);Difficulty in walking, not elsewhere classified (R26.2)     Time: 0175-1025 PT Time Calculation (min) (ACUTE ONLY): 36 min  Charges:  $Gait Training: 8-22 mins $Therapeutic Activity: 8-22 mins                      Tori Raylan Hanton PT, DPT 02/02/21, 10:12 AM

## 2021-02-02 NOTE — TOC Transition Note (Signed)
Transition of Care [facility]) - CM/SW Discharge Note  Patient Details  Name: Joyce Herrera MRN: [id number] Date of Birth: 1960-07-03  Transition of Care [facility]) CM/SW Contact:  Sherie Don, LCSW Phone Number: 02/02/2021, 12:56 PM  Clinical Narrative: Patient is expected to discharge home after working with PT. CSW met with patient to review discharge plan and needs. Patient has a rolling walker, but will need a 3N1. Patient will need HHPT, which was not prearranged but the patient reported the orthopedist would prefer [facility] if possible.  CSW made DME referral to [place] with [employer] and 3N1 was delivered to patient's room. CSW made [facility] referral to [facility] with [place], but the [facility] would be 7-10 days out for [place]. CSW referred patient to [facility] with [facility] and referral was accepted. Orders are in. TOC signing off.  Final next level of care: [place] Barriers to Discharge: No Barriers Identified  Patient Goals and CMS Choice Patient states their goals for this hospitalization and ongoing recovery are:: Discharge home with [place] CMS Medicare.gov Compare Post Acute Care list provided to:: Patient Choice offered to / list presented to : Patient  Discharge Plan and Services        DME Arranged: 3-N-1 DME Agency: [employer] Date DME Agency Contacted: 02/02/21 Representative spoke with at DME Agency: [place]: PT [place]: [place] ([place]) Date HH Agency Contacted: 02/02/21 Representative spoke with at [place]: Ramond Marrow  Readmission Risk Interventions No flowsheet data found.

## 2021-02-02 NOTE — Progress Notes (Signed)
° °  Subjective: 1 Day Post-Op Procedure(s) (LRB): Revision left knee unicompartmental arthroplasty to total knee arthroplasty (Left) Patient reports pain as mild.   Patient seen in rounds by Dr. Wynelle Link. Patient is well, and has had no acute complaints or problems. Denies SOB, chest pain, or calf pain. Had some nausea postoperatively that has calmed with Phenergan and Zofran.No acute overnight events. Will continue therapy today.    Objective: Vital signs in last 24 hours: Temp:  [97.3 F (36.3 C)-98.3 F (36.8 C)] 98.3 F (36.8 C) (01/05 0558) Pulse Rate:  [65-91] 75 (01/05 0558) Resp:  [10-23] 17 (01/05 0558) BP: (90-112)/(59-93) 109/70 (01/05 0558) SpO2:  [95 %-100 %] 100 % (01/05 0558)  Intake/Output from previous day:  Intake/Output Summary (Last 24 hours) at 02/02/2021 0820 Last data filed at 02/02/2021 0645 Gross per 24 hour  Intake 2695.85 ml  Output 2255 ml  Net 440.85 ml     Intake/Output this shift: No intake/output data recorded.  Labs: Recent Labs    02/02/21 0328  HGB 12.1   Recent Labs    02/02/21 0328  WBC 11.7*  RBC 3.86*  HCT 37.3  PLT 289   Recent Labs    02/02/21 0328  NA 141  K 3.8  CL 107  CO2 28  BUN 13  CREATININE 0.66  GLUCOSE 106*  CALCIUM 8.8*   No results for input(s): LABPT, INR in the last 72 hours.  Exam: General - Patient is Alert and Oriented Extremity - Neurologically intact Neurovascular intact Intact pulses distally Dressing - dressing C/D/I Motor Function - intact, moving foot and toes well on exam.   Past Medical History:  Diagnosis Date   Anxiety    Arthritis    Asthma    Depression    GERD (gastroesophageal reflux disease)    Headache(784.0)    History of colon polyps    Hyperthyroidism    Palpitations    Plantar fasciitis    Pneumonia    PONV (postoperative nausea and vomiting)    Seasonal allergies     Assessment/Plan: 1 Day Post-Op Procedure(s) (LRB): Revision left knee unicompartmental  arthroplasty to total knee arthroplasty (Left) Principal Problem:   OA (osteoarthritis) of knee Active Problems:   S/P total knee arthroplasty, left  Estimated body mass index is 29.29 kg/m as calculated from the following:   Height as of this encounter: 5\' 5"  (1.651 m).   Weight as of this encounter: 79.8 kg. Up with therapy  DVT Prophylaxis - Aspirin and TED hose Weight bearing as tolerated. Continue therapy.  Plan is to go Home after hospital stay.   Plan for two sessions with PT this morning, and if meeting goals, will plan for discharge this afternoon.   Patient to follow up in two weeks with Dr. Wynelle Link in clinic.   The PDMP database was reviewed today prior to any opioid medications being prescribed to this patient.Fenton Foy, [place], PA-C Orthopedic Surgery [phone] 02/02/2021, 8:20 AM

## 2021-02-02 NOTE — Plan of Care (Signed)
°  Problem: Health Behavior/Discharge Planning: Goal: Ability to manage health-related needs will improve Outcome: Adequate for Discharge   Problem: Clinical Measurements: Goal: Ability to maintain clinical measurements within normal limits will improve Outcome: Adequate for Discharge Goal: Will remain free from infection Outcome: Adequate for Discharge Goal: Diagnostic test results will improve Outcome: Adequate for Discharge Goal: Respiratory complications will improve Outcome: Adequate for Discharge Goal: Cardiovascular complication will be avoided Outcome: Adequate for Discharge   Problem: Activity: Goal: Risk for activity intolerance will decrease Outcome: Adequate for Discharge   Problem: Nutrition: Goal: Adequate nutrition will be maintained Outcome: Adequate for Discharge   Problem: Coping: Goal: Level of anxiety will decrease Outcome: Adequate for Discharge   Problem: Elimination: Goal: Will not experience complications related to bowel motility Outcome: Adequate for Discharge Goal: Will not experience complications related to urinary retention Outcome: Adequate for Discharge   Problem: Pain Managment: Goal: General experience of comfort will improve Outcome: Adequate for Discharge   Problem: Safety: Goal: Ability to remain free from injury will improve Outcome: Adequate for Discharge   Problem: Skin Integrity: Goal: Risk for impaired skin integrity will decrease Outcome: Adequate for Discharge   Problem: Education: Goal: Knowledge of the prescribed therapeutic regimen will improve Outcome: Adequate for Discharge   Problem: Activity: Goal: Ability to avoid complications of mobility impairment will improve Outcome: Adequate for Discharge Goal: Range of joint motion will improve Outcome: Adequate for Discharge   Problem: Clinical Measurements: Goal: Postoperative complications will be avoided or minimized Outcome: Adequate for Discharge   Problem:  Pain Management: Goal: Pain level will decrease with appropriate interventions Outcome: Adequate for Discharge   Problem: Skin Integrity: Goal: Will show signs of wound healing Outcome: Adequate for Discharge

## 2021-02-04 DIAGNOSIS — E0590 Thyrotoxicosis, unspecified without thyrotoxic crisis or storm: Secondary | ICD-10-CM | POA: Diagnosis not present

## 2021-02-04 DIAGNOSIS — Z7982 Long term (current) use of aspirin: Secondary | ICD-10-CM | POA: Diagnosis not present

## 2021-02-04 DIAGNOSIS — Z87891 Personal history of nicotine dependence: Secondary | ICD-10-CM | POA: Diagnosis not present

## 2021-02-04 DIAGNOSIS — K219 Gastro-esophageal reflux disease without esophagitis: Secondary | ICD-10-CM | POA: Diagnosis not present

## 2021-02-04 DIAGNOSIS — E785 Hyperlipidemia, unspecified: Secondary | ICD-10-CM | POA: Diagnosis not present

## 2021-02-04 DIAGNOSIS — R1319 Other dysphagia: Secondary | ICD-10-CM | POA: Diagnosis not present

## 2021-02-04 DIAGNOSIS — M1711 Unilateral primary osteoarthritis, right knee: Secondary | ICD-10-CM | POA: Diagnosis not present

## 2021-02-04 DIAGNOSIS — F32A Depression, unspecified: Secondary | ICD-10-CM | POA: Diagnosis not present

## 2021-02-04 DIAGNOSIS — J4540 Moderate persistent asthma, uncomplicated: Secondary | ICD-10-CM | POA: Diagnosis not present

## 2021-02-04 DIAGNOSIS — K641 Second degree hemorrhoids: Secondary | ICD-10-CM | POA: Diagnosis not present

## 2021-02-04 DIAGNOSIS — Z79891 Long term (current) use of opiate analgesic: Secondary | ICD-10-CM | POA: Diagnosis not present

## 2021-02-04 DIAGNOSIS — T84093D Other mechanical complication of internal left knee prosthesis, subsequent encounter: Secondary | ICD-10-CM | POA: Diagnosis not present

## 2021-02-06 ENCOUNTER — Encounter (HOSPITAL_COMMUNITY): Payer: Self-pay | Admitting: Orthopedic Surgery

## 2021-02-08 DIAGNOSIS — Z87891 Personal history of nicotine dependence: Secondary | ICD-10-CM | POA: Diagnosis not present

## 2021-02-08 DIAGNOSIS — E0590 Thyrotoxicosis, unspecified without thyrotoxic crisis or storm: Secondary | ICD-10-CM | POA: Diagnosis not present

## 2021-02-08 DIAGNOSIS — K219 Gastro-esophageal reflux disease without esophagitis: Secondary | ICD-10-CM | POA: Diagnosis not present

## 2021-02-08 DIAGNOSIS — Z7982 Long term (current) use of aspirin: Secondary | ICD-10-CM | POA: Diagnosis not present

## 2021-02-08 DIAGNOSIS — M1711 Unilateral primary osteoarthritis, right knee: Secondary | ICD-10-CM | POA: Diagnosis not present

## 2021-02-08 DIAGNOSIS — F32A Depression, unspecified: Secondary | ICD-10-CM | POA: Diagnosis not present

## 2021-02-08 DIAGNOSIS — Z79891 Long term (current) use of opiate analgesic: Secondary | ICD-10-CM | POA: Diagnosis not present

## 2021-02-08 DIAGNOSIS — J4540 Moderate persistent asthma, uncomplicated: Secondary | ICD-10-CM | POA: Diagnosis not present

## 2021-02-08 DIAGNOSIS — K641 Second degree hemorrhoids: Secondary | ICD-10-CM | POA: Diagnosis not present

## 2021-02-08 DIAGNOSIS — R1319 Other dysphagia: Secondary | ICD-10-CM | POA: Diagnosis not present

## 2021-02-08 DIAGNOSIS — T84093D Other mechanical complication of internal left knee prosthesis, subsequent encounter: Secondary | ICD-10-CM | POA: Diagnosis not present

## 2021-02-08 DIAGNOSIS — E785 Hyperlipidemia, unspecified: Secondary | ICD-10-CM | POA: Diagnosis not present

## 2021-02-10 DIAGNOSIS — K641 Second degree hemorrhoids: Secondary | ICD-10-CM | POA: Diagnosis not present

## 2021-02-10 DIAGNOSIS — K219 Gastro-esophageal reflux disease without esophagitis: Secondary | ICD-10-CM | POA: Diagnosis not present

## 2021-02-10 DIAGNOSIS — F32A Depression, unspecified: Secondary | ICD-10-CM | POA: Diagnosis not present

## 2021-02-10 DIAGNOSIS — Z79891 Long term (current) use of opiate analgesic: Secondary | ICD-10-CM | POA: Diagnosis not present

## 2021-02-10 DIAGNOSIS — R1319 Other dysphagia: Secondary | ICD-10-CM | POA: Diagnosis not present

## 2021-02-10 DIAGNOSIS — J4540 Moderate persistent asthma, uncomplicated: Secondary | ICD-10-CM | POA: Diagnosis not present

## 2021-02-10 DIAGNOSIS — M1711 Unilateral primary osteoarthritis, right knee: Secondary | ICD-10-CM | POA: Diagnosis not present

## 2021-02-10 DIAGNOSIS — E785 Hyperlipidemia, unspecified: Secondary | ICD-10-CM | POA: Diagnosis not present

## 2021-02-10 DIAGNOSIS — T84093D Other mechanical complication of internal left knee prosthesis, subsequent encounter: Secondary | ICD-10-CM | POA: Diagnosis not present

## 2021-02-10 DIAGNOSIS — E0590 Thyrotoxicosis, unspecified without thyrotoxic crisis or storm: Secondary | ICD-10-CM | POA: Diagnosis not present

## 2021-02-10 DIAGNOSIS — Z87891 Personal history of nicotine dependence: Secondary | ICD-10-CM | POA: Diagnosis not present

## 2021-02-10 DIAGNOSIS — Z7982 Long term (current) use of aspirin: Secondary | ICD-10-CM | POA: Diagnosis not present

## 2021-02-12 NOTE — Discharge Summary (Signed)
Physician Discharge Summary   Patient ID: RAMSEY MIDGETT MRN: [id number] DOB/AGE: [birth year] 63 y.o.  Admit date: 02/01/2021 Discharge date: 02/02/2021  Primary Diagnosis: Failed unicompartmental knee arthroplasty  Admission Diagnoses:  Past Medical History:  Diagnosis Date   Anxiety    Arthritis    Asthma    Depression    GERD (gastroesophageal reflux disease)    Headache(784.0)    History of colon polyps    Hyperthyroidism    Palpitations    Plantar fasciitis    Pneumonia    PONV (postoperative nausea and vomiting)    Seasonal allergies    Discharge Diagnoses:   Principal Problem:   OA (osteoarthritis) of knee Active Problems:   S/P total knee arthroplasty, left  Estimated body mass index is 29.29 kg/m as calculated from the following:   Height as of this encounter: 5\' 5"  (1.651 m).   Weight as of this encounter: 79.8 kg.  Procedure:  Procedure(s) (LRB): Revision left knee unicompartmental arthroplasty to total knee arthroplasty (Left)   Consults: None  HPI: The patient is a 61 year old female who had a left knee medial unicompartmental arthroplasty done several years ago.  She presented with pain and instability.  She had a bone scan, which showed loosening of the implant.  She also had some secondary degenerative changes.  She presents now for revision to a total knee arthroplasty.    Laboratory Data: Admission on 02/01/2021, Discharged on 02/02/2021  Component Date Value Ref Range Status   ABO/RH(D) 02/01/2021    Final                   Value:O POS Performed at [facility], [place] [address] [place], [place] [address]    SARS Coronavirus 2 02/01/2021 NEGATIVE  NEGATIVE Final   Comment: (NOTE) SARS-CoV-2 target nucleic acids are NOT DETECTED.  The SARS-CoV-2 RNA is generally detectable in upper and lower respiratory specimens during the acute phase of infection. The lowest concentration of SARS-CoV-2 viral copies this assay can detect is  250 copies / mL. A negative result does not preclude SARS-CoV-2 infection and should not be used as the sole basis for treatment or other patient management decisions.  A negative result may occur with improper specimen collection / handling, submission of specimen other than nasopharyngeal swab, presence of viral mutation(s) within the areas targeted by this assay, and inadequate number of viral copies (<250 copies / mL). A negative result must be combined with clinical observations, patient history, and epidemiological information.  Fact Sheet for Patients:   StrictlyIdeas.no  Fact Sheet for Healthcare Providers: BankingDealers.co.za  This test is not yet approved or                           cleared by the [place] FDA and has been authorized for detection and/or diagnosis of SARS-CoV-2 by FDA under an Emergency Use Authorization (EUA).  This EUA will remain in effect (meaning this test can be used) for the duration of the COVID-19 declaration under Section 564(b)(1) of the Act, 21 U.S.C. section 360bbb-3(b)(1), unless the authorization is terminated or revoked sooner.  Performed at [facility], [place] [address] [place], [place] [address]    WBC 02/02/2021 11.7 (H)  4.0 - 10.5 K/uL Final   RBC 02/02/2021 3.86 (L)  3.87 - 5.11 MIL/uL Final   Hemoglobin 02/02/2021 12.1  12.0 - 15.0 g/dL Final   HCT 02/02/2021 37.3  36.0 -  46.0 % Final   MCV 02/02/2021 96.6  80.0 - 100.0 fL Final   MCH 02/02/2021 31.3  26.0 - 34.0 pg Final   MCHC 02/02/2021 32.4  30.0 - 36.0 g/dL Final   RDW 02/02/2021 13.6  11.5 - 15.5 % Final   Platelets 02/02/2021 289  150 - 400 K/uL Final   nRBC 02/02/2021 0.0  0.0 - 0.2 % Final   Performed at [facility], [place] [address], [place], [place] [address]   Sodium 02/02/2021 141  135 - 145 mmol/L Final   Potassium 02/02/2021 3.8  3.5 - 5.1 mmol/L Final   Chloride 02/02/2021  107  98 - 111 mmol/L Final   CO2 02/02/2021 28  22 - 32 mmol/L Final   Glucose, Bld 02/02/2021 106 (H)  70 - 99 mg/dL Final   Glucose reference range applies only to samples taken after fasting for at least 8 hours.   BUN 02/02/2021 13  8 - 23 mg/dL Final   Creatinine, Ser 02/02/2021 0.66  0.44 - 1.00 mg/dL Final   Calcium 02/02/2021 8.8 (L)  8.9 - 10.3 mg/dL Final   GFR, Estimated 02/02/2021 >60  >60 mL/min Final   Comment: (NOTE) Calculated using the CKD-EPI Creatinine Equation (2021)    Anion gap 02/02/2021 6  5 - 15 Final   Performed at [facility], [place] [address] [place], [place] [address]  Hospital Outpatient Visit on 01/25/2021  Component Date Value Ref Range Status   MRSA, PCR 01/25/2021 NEGATIVE  NEGATIVE Final   Staphylococcus aureus 01/25/2021 NEGATIVE  NEGATIVE Final   Comment: (NOTE) The Xpert SA Assay (FDA approved for NASAL specimens in patients 30 years of age and older), is one component of a comprehensive surveillance program. It is not intended to diagnose infection nor to guide or monitor treatment. Performed at [facility], [place] [address], [place], [place] [address]    WBC 01/25/2021 7.7  4.0 - 10.5 K/uL Final   RBC 01/25/2021 4.58  3.87 - 5.11 MIL/uL Final   Hemoglobin 01/25/2021 14.4  12.0 - 15.0 g/dL Final   HCT 01/25/2021 44.7  36.0 - 46.0 % Final   MCV 01/25/2021 97.6  80.0 - 100.0 fL Final   MCH 01/25/2021 31.4  26.0 - 34.0 pg Final   MCHC 01/25/2021 32.2  30.0 - 36.0 g/dL Final   RDW 01/25/2021 14.0  11.5 - 15.5 % Final   Platelets 01/25/2021 272  150 - 400 K/uL Final   nRBC 01/25/2021 0.0  0.0 - 0.2 % Final   Performed at [facility], [place] [address] [place], [place] [address]   Sodium 01/25/2021 140  135 - 145 mmol/L Final   Potassium 01/25/2021 4.1  3.5 - 5.1 mmol/L Final   Chloride 01/25/2021 102  98 - 111 mmol/L Final   CO2 01/25/2021 31  22 - 32 mmol/L Final   Glucose, Bld 01/25/2021  101 (H)  70 - 99 mg/dL Final   Glucose reference range applies only to samples taken after fasting for at least 8 hours.   BUN 01/25/2021 15  8 - 23 mg/dL Final   Creatinine, Ser 01/25/2021 0.62  0.44 - 1.00 mg/dL Final   Calcium 01/25/2021 9.6  8.9 - 10.3 mg/dL Final   Total Protein 01/25/2021 7.2  6.5 - 8.1 g/dL Final   Albumin 01/25/2021 4.1  3.5 - 5.0 g/dL Final   AST 01/25/2021 24  15 - 41 U/L Final   ALT  01/25/2021 27  0 - 44 U/L Final   Alkaline Phosphatase 01/25/2021 91  38 - 126 U/L Final   Total Bilirubin 01/25/2021 0.6  0.3 - 1.2 mg/dL Final   GFR, Estimated 01/25/2021 >60  >60 mL/min Final   Comment: (NOTE) Calculated using the CKD-EPI Creatinine Equation (2021)    Anion gap 01/25/2021 7  5 - 15 Final   Performed at [facility], [place] [address] [place], [place] [address]   ABO/RH(D) 01/25/2021 O POS   Final   Antibody Screen 01/25/2021 NEG   Final   Sample Expiration 01/25/2021 02/04/2021,2359   Final   Extend sample reason 01/25/2021    Final                   Value:NO TRANSFUSIONS OR PREGNANCY IN THE PAST 3 MONTHS Performed at [place] [address] [place], [place] [address]    Prothrombin Time 01/25/2021 12.0  11.4 - 15.2 seconds Final   INR 01/25/2021 0.9  0.8 - 1.2 Final   Comment: (NOTE) INR goal varies based on device and disease states. Performed at [facility], [place] [address], [place], [place] [address]      X-Rays:No results found.  EKG: Orders placed or performed during the hospital encounter of 01/25/21   EKG 12 lead per protocol   EKG 12 lead per protocol     Hospital Course: JENNIE HANNAY is a 61 y.o. who was admitted to [facility]. They were brought to the operating room on 02/01/2021 and underwent Procedure(s): Revision left knee unicompartmental arthroplasty to total knee arthroplasty.  Patient tolerated the procedure well and was later transferred to the recovery room  and then to the orthopaedic floor for postoperative care. They were given PO and IV analgesics for pain control following their surgery. They were given 24 hours of postoperative antibiotics of  Anti-infectives (From admission, onward)    Start     Dose/Rate Route Frequency Ordered Stop   02/01/21 2000  vancomycin (VANCOCIN) IVPB 1000 mg/200 mL premix        1,000 mg 200 mL/hr over 60 Minutes Intravenous Every 12 hours 02/01/21 1429 02/01/21 2213   02/01/21 0645  vancomycin (VANCOREADY) IVPB 1500 mg/300 mL        1,500 mg 150 mL/hr over 120 Minutes Intravenous  Once 02/01/21 0637 02/01/21 0942   02/01/21 0615  ceFAZolin (ANCEF) IVPB 2g/100 mL premix  Status:  Discontinued        2 g 200 mL/hr over 30 Minutes Intravenous On call to O.R. 02/01/21 7867 02/01/21 6720      and started on DVT prophylaxis in the form of Aspirin and TED hose.   PT and OT were ordered for total joint protocol. Discharge planning consulted to help with postop disposition and equipment needs.  Patient had an uneventful night on the evening of surgery. They started to get up OOB with therapy on POD#1. Pt was seen during rounds and was ready to go home pending progress with therapy. She worked with therapy on POD #1 and was meeting goals. Pt was discharged to home later that day in stable condition.  Diet: Regular diet Activity: WBAT Follow-up: in two weeks Disposition: Home Discharged Condition: good   Discharge Instructions     Call MD / Call 911   Complete by: As directed    If you experience chest pain or shortness of breath, CALL 911 and be transported to the  hospital emergency room.  If you develope a fever above 101 F, pus (white drainage) or increased drainage or redness at the wound, or calf pain, call your surgeon's office.   Change dressing   Complete by: As directed    You may remove the bulky bandage (ACE wrap and gauze) two days after surgery. You will have an adhesive waterproof bandage underneath.  Leave this in place until your first follow-up appointment.   Constipation Prevention   Complete by: As directed    Drink plenty of fluids.  Prune juice may be helpful.  You may use a stool softener, such as Colace (over the counter) 100 mg twice a day.  Use MiraLax (over the counter) for constipation as needed.   Diet - low sodium heart healthy   Complete by: As directed    Do not put a pillow under the knee. Place it under the heel.   Complete by: As directed    Driving restrictions   Complete by: As directed    No driving for two weeks   Post-operative opioid taper instructions:   Complete by: As directed    POST-OPERATIVE OPIOID TAPER INSTRUCTIONS: It is important to wean off of your opioid medication as soon as possible. If you do not need pain medication after your surgery it is ok to stop day one. Opioids include: Codeine, Hydrocodone(Norco, Vicodin), Oxycodone(Percocet, oxycontin) and hydromorphone amongst others.  Long term and even short term use of opiods can cause: Increased pain response Dependence Constipation Depression Respiratory depression And more.  Withdrawal symptoms can include Flu like symptoms Nausea, vomiting And more Techniques to manage these symptoms Hydrate well Eat regular healthy meals Stay active Use relaxation techniques(deep breathing, meditating, yoga) Do Not substitute Alcohol to help with tapering If you have been on opioids for less than two weeks and do not have pain than it is ok to stop all together.  Plan to wean off of opioids This plan should start within one week post op of your joint replacement. Maintain the same interval or time between taking each dose and first decrease the dose.  Cut the total daily intake of opioids by one tablet each day Next start to increase the time between doses. The last dose that should be eliminated is the evening dose.      TED hose   Complete by: As directed    Use stockings (TED hose) for  three weeks on both leg(s).  You may remove them at night for sleeping.   Weight bearing as tolerated   Complete by: As directed       Allergies as of 02/02/2021       Reactions   Cephalexin Other (See Comments)   Pt stated, "Ears started to itch; hands and feet were itching"   Nickel Rash   Penicillins Rash   Has patient had a PCN reaction causing immediate rash, facial/tongue/throat swelling, SOB or lightheadedness with hypotension: No Has patient had a PCN reaction causing severe rash involving mucus membranes or skin necrosis: No Has patient had a PCN reaction that required hospitalization: No Has patient had a PCN reaction occurring within the last 10 years: No If all of the above answers are "NO", then may proceed with Cephalosporin use.        Medication List     STOP taking these medications    flurbiprofen 100 MG tablet Commonly known as: ANSAID       TAKE these medications    ALPRAZolam 0.5  MG tablet Commonly known as: XANAX Take 0.25 mg by mouth daily as needed for anxiety.   aspirin 325 MG EC tablet Take 1 tablet (325 mg total) by mouth 2 (two) times daily. Take for three weeks, then transition to 81mg  Aspirin once daily for three weeks. Then discontinue Aspirin. What changed:  medication strength how much to take when to take this additional instructions   atorvastatin 10 MG tablet Commonly known as: LIPITOR Take 10 mg by mouth daily.   CALCIUM CARBONATE-VITAMIN D PO Take 1 tablet by mouth daily with breakfast.   CULTURELLE DIGESTIVE HEALTH PO Take 1 capsule by mouth daily.   ELDERBERRY PO Take 1 tablet by mouth daily.   Epinastine HCl 0.05 % ophthalmic solution Place 1 drop into both eyes 2 (two) times daily as needed (allergies).   esomeprazole 40 MG capsule Commonly known as: NEXIUM Take 40 mg by mouth daily as needed (acid reflux).   HYDROcodone-acetaminophen 5-325 MG tablet Commonly known as: NORCO/VICODIN Take 1-2 tablets by mouth  every 6 (six) hours as needed for severe pain.   ipratropium 0.03 % nasal spray Commonly known as: ATROVENT Place 1 spray into both nostrils every 8 (eight) hours as needed for rhinitis.   levocetirizine 5 MG tablet Commonly known as: XYZAL TAKE (1) TABLET BY MOUTH TWICE DAILY. What changed: See the new instructions.   methocarbamol 500 MG tablet Commonly known as: ROBAXIN Take 1 tablet (500 mg total) by mouth every 6 (six) hours as needed for muscle spasms.   montelukast 10 MG tablet Commonly known as: SINGULAIR TAKE (1) TABLET BY MOUTH ONCE DAILY.   multivitamin with minerals Tabs tablet Take 1 tablet by mouth daily.   ODOR FREE GARLIC PO Take 1 capsule by mouth daily.   ondansetron 4 MG tablet Commonly known as: ZOFRAN Take 1 tablet (4 mg total) by mouth every 6 (six) hours as needed for nausea.   pregabalin 300 MG capsule Commonly known as: LYRICA Take 300 mg by mouth every evening.   Probiotic Daily Caps Take 1 capsule by mouth daily.   propranolol 20 MG tablet Commonly known as: INDERAL Take 20 mg by mouth at bedtime.   traMADol 50 MG tablet Commonly known as: ULTRAM Take 1-2 tablets (50-100 mg total) by mouth every 6 (six) hours as needed for moderate pain.   zolpidem 10 MG tablet Commonly known as: AMBIEN Take 5 mg by mouth at bedtime.               Discharge Care Instructions  (From admission, onward)           Start     Ordered   02/02/21 0000  Weight bearing as tolerated        02/02/21 0834   02/02/21 0000  Change dressing       Comments: You may remove the bulky bandage (ACE wrap and gauze) two days after surgery. You will have an adhesive waterproof bandage underneath. Leave this in place until your first follow-up appointment.   02/02/21 0834            Follow-up Information     Gaynelle Arabian, MD. Schedule an appointment as soon as possible for a visit in 2 week(s).   Specialty: Orthopedic Surgery Contact  information: [address] [place] [address] [phone]         [facility] Follow up.   Specialty: Home Health Services Why: PT  Signed: Fenton Foy, MBA, PA-C Orthopedic Surgery 02/12/2021, 8:50 PM

## 2021-02-21 DIAGNOSIS — R531 Weakness: Secondary | ICD-10-CM | POA: Diagnosis not present

## 2021-02-21 DIAGNOSIS — M25562 Pain in left knee: Secondary | ICD-10-CM | POA: Diagnosis not present

## 2021-02-21 DIAGNOSIS — Z96652 Presence of left artificial knee joint: Secondary | ICD-10-CM | POA: Diagnosis not present

## 2021-02-21 DIAGNOSIS — R2689 Other abnormalities of gait and mobility: Secondary | ICD-10-CM | POA: Diagnosis not present

## 2021-02-22 ENCOUNTER — Other Ambulatory Visit (HOSPITAL_COMMUNITY): Payer: PPO

## 2021-02-23 DIAGNOSIS — R2689 Other abnormalities of gait and mobility: Secondary | ICD-10-CM | POA: Diagnosis not present

## 2021-02-23 DIAGNOSIS — R531 Weakness: Secondary | ICD-10-CM | POA: Diagnosis not present

## 2021-02-23 DIAGNOSIS — M25562 Pain in left knee: Secondary | ICD-10-CM | POA: Diagnosis not present

## 2021-02-23 DIAGNOSIS — Z96652 Presence of left artificial knee joint: Secondary | ICD-10-CM | POA: Diagnosis not present

## 2021-02-24 DIAGNOSIS — R2689 Other abnormalities of gait and mobility: Secondary | ICD-10-CM | POA: Diagnosis not present

## 2021-02-24 DIAGNOSIS — Z96652 Presence of left artificial knee joint: Secondary | ICD-10-CM | POA: Diagnosis not present

## 2021-02-24 DIAGNOSIS — M25562 Pain in left knee: Secondary | ICD-10-CM | POA: Diagnosis not present

## 2021-02-24 DIAGNOSIS — R531 Weakness: Secondary | ICD-10-CM | POA: Diagnosis not present

## 2021-02-27 DIAGNOSIS — Z96652 Presence of left artificial knee joint: Secondary | ICD-10-CM | POA: Diagnosis not present

## 2021-02-27 DIAGNOSIS — M25562 Pain in left knee: Secondary | ICD-10-CM | POA: Diagnosis not present

## 2021-02-27 DIAGNOSIS — R531 Weakness: Secondary | ICD-10-CM | POA: Diagnosis not present

## 2021-02-27 DIAGNOSIS — R2689 Other abnormalities of gait and mobility: Secondary | ICD-10-CM | POA: Diagnosis not present

## 2021-03-01 DIAGNOSIS — R531 Weakness: Secondary | ICD-10-CM | POA: Diagnosis not present

## 2021-03-01 DIAGNOSIS — M25562 Pain in left knee: Secondary | ICD-10-CM | POA: Diagnosis not present

## 2021-03-01 DIAGNOSIS — Z96652 Presence of left artificial knee joint: Secondary | ICD-10-CM | POA: Diagnosis not present

## 2021-03-01 DIAGNOSIS — R2689 Other abnormalities of gait and mobility: Secondary | ICD-10-CM | POA: Diagnosis not present

## 2021-03-03 DIAGNOSIS — R531 Weakness: Secondary | ICD-10-CM | POA: Diagnosis not present

## 2021-03-03 DIAGNOSIS — R2689 Other abnormalities of gait and mobility: Secondary | ICD-10-CM | POA: Diagnosis not present

## 2021-03-03 DIAGNOSIS — M25562 Pain in left knee: Secondary | ICD-10-CM | POA: Diagnosis not present

## 2021-03-03 DIAGNOSIS — Z96652 Presence of left artificial knee joint: Secondary | ICD-10-CM | POA: Diagnosis not present

## 2021-03-06 DIAGNOSIS — R531 Weakness: Secondary | ICD-10-CM | POA: Diagnosis not present

## 2021-03-06 DIAGNOSIS — M25562 Pain in left knee: Secondary | ICD-10-CM | POA: Diagnosis not present

## 2021-03-06 DIAGNOSIS — R2689 Other abnormalities of gait and mobility: Secondary | ICD-10-CM | POA: Diagnosis not present

## 2021-03-06 DIAGNOSIS — Z96652 Presence of left artificial knee joint: Secondary | ICD-10-CM | POA: Diagnosis not present

## 2021-03-08 DIAGNOSIS — Z96652 Presence of left artificial knee joint: Secondary | ICD-10-CM | POA: Diagnosis not present

## 2021-03-08 DIAGNOSIS — M25562 Pain in left knee: Secondary | ICD-10-CM | POA: Diagnosis not present

## 2021-03-08 DIAGNOSIS — R531 Weakness: Secondary | ICD-10-CM | POA: Diagnosis not present

## 2021-03-08 DIAGNOSIS — Z4789 Encounter for other orthopedic aftercare: Secondary | ICD-10-CM | POA: Diagnosis not present

## 2021-03-08 DIAGNOSIS — R2689 Other abnormalities of gait and mobility: Secondary | ICD-10-CM | POA: Diagnosis not present

## 2021-03-14 DIAGNOSIS — R2689 Other abnormalities of gait and mobility: Secondary | ICD-10-CM | POA: Diagnosis not present

## 2021-03-14 DIAGNOSIS — Z96652 Presence of left artificial knee joint: Secondary | ICD-10-CM | POA: Diagnosis not present

## 2021-03-14 DIAGNOSIS — R531 Weakness: Secondary | ICD-10-CM | POA: Diagnosis not present

## 2021-03-14 DIAGNOSIS — M25562 Pain in left knee: Secondary | ICD-10-CM | POA: Diagnosis not present

## 2021-03-16 DIAGNOSIS — R531 Weakness: Secondary | ICD-10-CM | POA: Diagnosis not present

## 2021-03-16 DIAGNOSIS — Z96652 Presence of left artificial knee joint: Secondary | ICD-10-CM | POA: Diagnosis not present

## 2021-03-16 DIAGNOSIS — R2689 Other abnormalities of gait and mobility: Secondary | ICD-10-CM | POA: Diagnosis not present

## 2021-03-16 DIAGNOSIS — M25562 Pain in left knee: Secondary | ICD-10-CM | POA: Diagnosis not present

## 2021-03-21 DIAGNOSIS — R2689 Other abnormalities of gait and mobility: Secondary | ICD-10-CM | POA: Diagnosis not present

## 2021-03-21 DIAGNOSIS — M25562 Pain in left knee: Secondary | ICD-10-CM | POA: Diagnosis not present

## 2021-03-21 DIAGNOSIS — Z96652 Presence of left artificial knee joint: Secondary | ICD-10-CM | POA: Diagnosis not present

## 2021-03-21 DIAGNOSIS — R531 Weakness: Secondary | ICD-10-CM | POA: Diagnosis not present

## 2021-03-24 DIAGNOSIS — R531 Weakness: Secondary | ICD-10-CM | POA: Diagnosis not present

## 2021-03-24 DIAGNOSIS — M25562 Pain in left knee: Secondary | ICD-10-CM | POA: Diagnosis not present

## 2021-03-24 DIAGNOSIS — Z96652 Presence of left artificial knee joint: Secondary | ICD-10-CM | POA: Diagnosis not present

## 2021-03-24 DIAGNOSIS — R2689 Other abnormalities of gait and mobility: Secondary | ICD-10-CM | POA: Diagnosis not present

## 2021-03-28 DIAGNOSIS — R531 Weakness: Secondary | ICD-10-CM | POA: Diagnosis not present

## 2021-03-28 DIAGNOSIS — R2689 Other abnormalities of gait and mobility: Secondary | ICD-10-CM | POA: Diagnosis not present

## 2021-03-28 DIAGNOSIS — M25562 Pain in left knee: Secondary | ICD-10-CM | POA: Diagnosis not present

## 2021-03-28 DIAGNOSIS — Z96652 Presence of left artificial knee joint: Secondary | ICD-10-CM | POA: Diagnosis not present

## 2021-03-29 ENCOUNTER — Encounter (INDEPENDENT_AMBULATORY_CARE_PROVIDER_SITE_OTHER): Payer: Self-pay | Admitting: *Deleted

## 2021-03-30 ENCOUNTER — Encounter (INDEPENDENT_AMBULATORY_CARE_PROVIDER_SITE_OTHER): Payer: Self-pay | Admitting: *Deleted

## 2021-03-30 ENCOUNTER — Other Ambulatory Visit (INDEPENDENT_AMBULATORY_CARE_PROVIDER_SITE_OTHER): Payer: Self-pay

## 2021-03-30 DIAGNOSIS — Z800 Family history of malignant neoplasm of digestive organs: Secondary | ICD-10-CM

## 2021-03-30 DIAGNOSIS — Z86010 Personal history of colonic polyps: Secondary | ICD-10-CM

## 2021-03-30 NOTE — Telephone Encounter (Signed)
Error   This encounter was created in error - please disregard. 

## 2021-03-31 DIAGNOSIS — R531 Weakness: Secondary | ICD-10-CM | POA: Diagnosis not present

## 2021-03-31 DIAGNOSIS — M25562 Pain in left knee: Secondary | ICD-10-CM | POA: Diagnosis not present

## 2021-03-31 DIAGNOSIS — Z96652 Presence of left artificial knee joint: Secondary | ICD-10-CM | POA: Diagnosis not present

## 2021-03-31 DIAGNOSIS — R2689 Other abnormalities of gait and mobility: Secondary | ICD-10-CM | POA: Diagnosis not present

## 2021-04-04 DIAGNOSIS — M25562 Pain in left knee: Secondary | ICD-10-CM | POA: Diagnosis not present

## 2021-04-04 DIAGNOSIS — R2689 Other abnormalities of gait and mobility: Secondary | ICD-10-CM | POA: Diagnosis not present

## 2021-04-04 DIAGNOSIS — Z96652 Presence of left artificial knee joint: Secondary | ICD-10-CM | POA: Diagnosis not present

## 2021-04-04 DIAGNOSIS — R531 Weakness: Secondary | ICD-10-CM | POA: Diagnosis not present

## 2021-04-11 DIAGNOSIS — R2689 Other abnormalities of gait and mobility: Secondary | ICD-10-CM | POA: Diagnosis not present

## 2021-04-11 DIAGNOSIS — M25562 Pain in left knee: Secondary | ICD-10-CM | POA: Diagnosis not present

## 2021-04-11 DIAGNOSIS — Z96652 Presence of left artificial knee joint: Secondary | ICD-10-CM | POA: Diagnosis not present

## 2021-04-11 DIAGNOSIS — R531 Weakness: Secondary | ICD-10-CM | POA: Diagnosis not present

## 2021-04-12 DIAGNOSIS — Z96652 Presence of left artificial knee joint: Secondary | ICD-10-CM | POA: Diagnosis not present

## 2021-04-12 DIAGNOSIS — M25562 Pain in left knee: Secondary | ICD-10-CM | POA: Diagnosis not present

## 2021-04-12 DIAGNOSIS — R2689 Other abnormalities of gait and mobility: Secondary | ICD-10-CM | POA: Diagnosis not present

## 2021-04-12 DIAGNOSIS — R531 Weakness: Secondary | ICD-10-CM | POA: Diagnosis not present

## 2021-04-18 ENCOUNTER — Telehealth (INDEPENDENT_AMBULATORY_CARE_PROVIDER_SITE_OTHER): Payer: Self-pay | Admitting: *Deleted

## 2021-04-18 DIAGNOSIS — M25562 Pain in left knee: Secondary | ICD-10-CM | POA: Diagnosis not present

## 2021-04-18 DIAGNOSIS — R2689 Other abnormalities of gait and mobility: Secondary | ICD-10-CM | POA: Diagnosis not present

## 2021-04-18 DIAGNOSIS — R531 Weakness: Secondary | ICD-10-CM | POA: Diagnosis not present

## 2021-04-18 DIAGNOSIS — Z96652 Presence of left artificial knee joint: Secondary | ICD-10-CM | POA: Diagnosis not present

## 2021-04-18 NOTE — Telephone Encounter (Signed)
Referring MD/PCP: fusco ? ?Procedure: tcs ? ?Reason/Indication:  hx polyps, fam hx colon ca ? ?Has patient had this procedure before?  Yes, 12/2015 ? If so, when, by whom and where?   ? ?Is there a family history of colon cancer?  Yes, father ? Who?  What age when diagnosed?   ? ?Is patient diabetic? If yes, Type 1 or Type 2   no ?     ?Does patient have prosthetic heart valve or mechanical valve?  no ? ?Do you have a pacemaker/defibrillator?  no ? ?Has patient ever had endocarditis/atrial fibrillation? no ? ?Does patient use oxygen? no ? ?Has patient had joint replacement within last 12 months?  no ? ?Is patient constipated or do they take laxatives? no ? ?Does patient have a history of alcohol/drug use?  no ? ?Have you had a stroke/heart attack last 6 mths? no ? ?Do you take medicine for weight loss?  no ? ?For female patients,: have you had a hysterectomy  ?                     are you post menopausal  ?                     do you still have your menstrual cycle no ? ?Is patient on blood thinner such as Coumadin, Plavix and/or Aspirin? yes ? ?Medications: asa 81 mg daily, garlic 2248 mg daily, pregabalion 300 mg daily, elderberry 2500 mg daily, zolpidem 10 mg 1/2 tab nightly, probiotic 100 mg daily, calcium 1000 mg daily, cetirizine 10 mg daily, montelukast 10 mg daily, propranolol 20 mg daily, levocetirizine 5 mg daily, atorvastatin 5 mg daily ? ?Allergies: pcn, keflex ? ?Medication Adjustment per Dr Rehman/Dr Jenetta Downer asa 2 days ? ?Procedure date & time: 05/18/21 ? ? ?

## 2021-04-20 DIAGNOSIS — R2689 Other abnormalities of gait and mobility: Secondary | ICD-10-CM | POA: Diagnosis not present

## 2021-04-20 DIAGNOSIS — R531 Weakness: Secondary | ICD-10-CM | POA: Diagnosis not present

## 2021-04-20 DIAGNOSIS — M25562 Pain in left knee: Secondary | ICD-10-CM | POA: Diagnosis not present

## 2021-04-20 DIAGNOSIS — Z96652 Presence of left artificial knee joint: Secondary | ICD-10-CM | POA: Diagnosis not present

## 2021-04-26 DIAGNOSIS — R531 Weakness: Secondary | ICD-10-CM | POA: Diagnosis not present

## 2021-04-26 DIAGNOSIS — M25562 Pain in left knee: Secondary | ICD-10-CM | POA: Diagnosis not present

## 2021-04-26 DIAGNOSIS — R2689 Other abnormalities of gait and mobility: Secondary | ICD-10-CM | POA: Diagnosis not present

## 2021-04-26 DIAGNOSIS — Z96652 Presence of left artificial knee joint: Secondary | ICD-10-CM | POA: Diagnosis not present

## 2021-05-18 ENCOUNTER — Encounter (HOSPITAL_COMMUNITY)
Admission: RE | Disposition: A | Discharge status: Home / Self Care | Payer: Self-pay | Source: Home / Self Care | Attending: Internal Medicine

## 2021-05-18 ENCOUNTER — Ambulatory Visit (HOSPITAL_COMMUNITY)
Admission: RE | Admit: 2021-05-18 | Discharge: 2021-05-18 | Disposition: A | Discharge status: Home / Self Care | Payer: PPO | Attending: Internal Medicine | Admitting: Internal Medicine

## 2021-05-18 ENCOUNTER — Encounter (HOSPITAL_COMMUNITY): Payer: Self-pay | Admitting: Internal Medicine

## 2021-05-18 ENCOUNTER — Other Ambulatory Visit: Payer: Self-pay

## 2021-05-18 ENCOUNTER — Ambulatory Visit (HOSPITAL_COMMUNITY): Payer: PPO | Admitting: Anesthesiology

## 2021-05-18 ENCOUNTER — Encounter (INDEPENDENT_AMBULATORY_CARE_PROVIDER_SITE_OTHER): Payer: Self-pay | Admitting: *Deleted

## 2021-05-18 ENCOUNTER — Ambulatory Visit (HOSPITAL_BASED_OUTPATIENT_CLINIC_OR_DEPARTMENT_OTHER): Payer: PPO | Admitting: Anesthesiology

## 2021-05-18 DIAGNOSIS — F32A Depression, unspecified: Secondary | ICD-10-CM | POA: Diagnosis not present

## 2021-05-18 DIAGNOSIS — F418 Other specified anxiety disorders: Secondary | ICD-10-CM | POA: Diagnosis not present

## 2021-05-18 DIAGNOSIS — K219 Gastro-esophageal reflux disease without esophagitis: Secondary | ICD-10-CM | POA: Insufficient documentation

## 2021-05-18 DIAGNOSIS — K644 Residual hemorrhoidal skin tags: Secondary | ICD-10-CM | POA: Diagnosis not present

## 2021-05-18 DIAGNOSIS — Z800 Family history of malignant neoplasm of digestive organs: Secondary | ICD-10-CM | POA: Diagnosis not present

## 2021-05-18 DIAGNOSIS — K5730 Diverticulosis of large intestine without perforation or abscess without bleeding: Secondary | ICD-10-CM | POA: Insufficient documentation

## 2021-05-18 DIAGNOSIS — Z7982 Long term (current) use of aspirin: Secondary | ICD-10-CM | POA: Insufficient documentation

## 2021-05-18 DIAGNOSIS — Z86010 Personal history of colonic polyps: Secondary | ICD-10-CM

## 2021-05-18 DIAGNOSIS — K648 Other hemorrhoids: Secondary | ICD-10-CM | POA: Diagnosis not present

## 2021-05-18 DIAGNOSIS — Z87891 Personal history of nicotine dependence: Secondary | ICD-10-CM | POA: Insufficient documentation

## 2021-05-18 DIAGNOSIS — Z1211 Encounter for screening for malignant neoplasm of colon: Secondary | ICD-10-CM | POA: Insufficient documentation

## 2021-05-18 DIAGNOSIS — K635 Polyp of colon: Secondary | ICD-10-CM | POA: Diagnosis not present

## 2021-05-18 DIAGNOSIS — D120 Benign neoplasm of cecum: Secondary | ICD-10-CM | POA: Diagnosis not present

## 2021-05-18 DIAGNOSIS — Z09 Encounter for follow-up examination after completed treatment for conditions other than malignant neoplasm: Secondary | ICD-10-CM | POA: Diagnosis not present

## 2021-05-18 DIAGNOSIS — J45909 Unspecified asthma, uncomplicated: Secondary | ICD-10-CM | POA: Diagnosis not present

## 2021-05-18 DIAGNOSIS — D123 Benign neoplasm of transverse colon: Secondary | ICD-10-CM | POA: Diagnosis not present

## 2021-05-18 HISTORY — PX: POLYPECTOMY: SHX149

## 2021-05-18 HISTORY — PX: COLONOSCOPY WITH PROPOFOL: SHX5780

## 2021-05-18 LAB — HM COLONOSCOPY

## 2021-05-18 SURGERY — COLONOSCOPY WITH PROPOFOL
Anesthesia: General

## 2021-05-18 MED ORDER — PROPOFOL 1000 MG/100ML IV EMUL
INTRAVENOUS | Status: AC
  Filled 2021-05-18: qty 100

## 2021-05-18 MED ORDER — PROPOFOL 10 MG/ML IV BOLUS
INTRAVENOUS | Status: DC | PRN
  Administered 2021-05-18: 80 mg via INTRAVENOUS

## 2021-05-18 MED ORDER — PHENYLEPHRINE HCL (PRESSORS) 10 MG/ML IV SOLN
INTRAVENOUS | Status: DC | PRN
  Administered 2021-05-18 (×3): 100 ug via INTRAVENOUS

## 2021-05-18 MED ORDER — PHENYLEPHRINE 80 MCG/ML (10ML) SYRINGE FOR IV PUSH (FOR BLOOD PRESSURE SUPPORT)
PREFILLED_SYRINGE | INTRAVENOUS | Status: AC
  Filled 2021-05-18: qty 10

## 2021-05-18 MED ORDER — PROPOFOL 500 MG/50ML IV EMUL
INTRAVENOUS | Status: DC | PRN
  Administered 2021-05-18: 150 ug/kg/min via INTRAVENOUS

## 2021-05-18 MED ORDER — LACTATED RINGERS IV SOLN
INTRAVENOUS | Status: DC
  Administered 2021-05-18: 1000 mL via INTRAVENOUS

## 2021-05-18 NOTE — Discharge Instructions (Signed)
No aspirin or NSAIDs for 24 hours. ?Resume other medications as before. ?High-fiber diet. ?No driving for 24 hours. ?Physician will call with biopsy results. ?

## 2021-05-18 NOTE — H&P (Signed)
Joyce Herrera is an 61 y.o. female.   ?Chief Complaint: Patient is here for colonoscopy ?HPI: Patient is 61 year old Caucasian female who has a history of colonic adenomas and family history of colon carcinoma who is here for surveillance colonoscopy.  Last exam was in December 2017 with removal of 2 polyps and one was a tubular adenoma.  She had tubular adenoma removed back in 2007 as well.  She denies abdominal pain change in bowel habits or rectal bleeding. ?She is on full dose aspirin which is on hold. ?Family history is positive for colon carcinoma in her father who was 31 at the time of diagnosis and died of unrelated causes several years later. ? ?Past Medical History:  ?Diagnosis Date  ? Anxiety   ? Arthritis   ? Asthma   ? Depression   ? GERD (gastroesophageal reflux disease)   ? Headache(784.0)   ? History of colon polyps   ? Hyperthyroidism   ? Palpitations   ? Plantar fasciitis   ? Pneumonia   ? PONV (postoperative nausea and vomiting)   ? Seasonal allergies   ? ? ?Past Surgical History:  ?Procedure Laterality Date  ? ABDOMINAL HYSTERECTOMY    ? BIOPSY  10/23/2017  ? Procedure: BIOPSY;  Surgeon: Rogene Houston, MD;  [facility]: [facility];  Service: Endoscopy;;  gastric  ? CHOLECYSTECTOMY    ? COLONOSCOPY  09/14/2010  ? Procedure: COLONOSCOPY;  Surgeon: Rogene Houston, MD;  Location: [facility];  Service: Endoscopy;  Laterality: N/A;  ? COLONOSCOPY N/A 01/11/2016  ? Procedure: COLONOSCOPY;  Surgeon: Rogene Houston, MD;  Location: [facility];  Service: Endoscopy;  Laterality: N/A;  930  ? CONVERSION TO TOTAL KNEE Left 02/01/2021  ? Procedure: Revision left knee unicompartmental arthroplasty to total knee arthroplasty;  Surgeon: Gaynelle Arabian, MD;  Location: [facility];  Service: Orthopedics;  Laterality: Left;  ? ESOPHAGEAL DILATION N/A 10/23/2017  ? Procedure: ESOPHAGEAL DILATION;  Surgeon: Rogene Houston, MD;  Location: [facility];  Service: Endoscopy;  Laterality: N/A;  ?  ESOPHAGOGASTRODUODENOSCOPY N/A 10/23/2017  ? Procedure: ESOPHAGOGASTRODUODENOSCOPY (EGD);  Surgeon: Rogene Houston, MD;  [facility]: [facility];  Service: Endoscopy;  Laterality: N/A;  11:25-rescheduled to 9/25 @ 8:30am per Lelon Frohlich  ? MEDIAL PARTIAL KNEE REPLACEMENT Left   ? NASAL SEPTUM SURGERY    ? PLANTAR FASCIECTOMY    ? bilateral  ? POLYPECTOMY  01/11/2016  ? Procedure: POLYPECTOMY;  Surgeon: Rogene Houston, MD;  Location: [facility];  Service: Endoscopy;;  multiple colon polyps ?  ? TONSILLECTOMY    ? ? ?Family History  ?Problem Relation Age of Onset  ? Lung cancer Mother   ? Kidney cancer Mother   ? Colon cancer Father   ? Prostate cancer Father   ? Diabetes Father   ? Hypertension Father   ? Kidney failure Father   ? ?Social History:  reports that she has quit smoking. She has never used smokeless tobacco. She reports that she does not currently use alcohol. She reports that she does not use drugs. ? ?Allergies:  ?Allergies  ?Allergen Reactions  ? Cephalexin Other (See Comments)  ?  Pt stated, "Ears started to itch; hands and feet were itching"  ? Nickel Rash  ? Penicillins Rash  ?  Has patient had a PCN reaction causing immediate rash, facial/tongue/throat swelling, SOB or lightheadedness with hypotension: No ?Has patient had a PCN reaction causing severe rash involving mucus membranes or skin  necrosis: No ?Has patient had a PCN reaction that required hospitalization: No ?Has patient had a PCN reaction occurring within the last 10 years: No ?If all of the above answers are "NO", then may proceed with Cephalosporin use. ?  ? ? ?Medications Prior to Admission  ?Medication Sig Dispense Refill  ? aspirin EC 325 MG EC tablet Take 1 tablet (325 mg total) by mouth 2 (two) times daily. Take for three weeks, then transition to '81mg'$  Aspirin once daily for three weeks. Then discontinue Aspirin. (Patient taking differently: Take 81 mg by mouth at bedtime.) 42 tablet 0  ? atorvastatin (LIPITOR) 10 MG tablet Take 10 mg  by mouth at bedtime.    ? CALCIUM CARBONATE-VITAMIN D PO Take 1,200 tablets by mouth at bedtime.    ? dextromethorphan-guaiFENesin (MUCINEX DM) 30-600 MG 12hr tablet Take 1 tablet by mouth 2 (two) times daily as needed (Congestion).    ? Epinastine HCl 0.05 % ophthalmic solution Place 1 drop into both eyes 2 (two) times daily as needed (allergies).    ? esomeprazole (NEXIUM) 40 MG capsule Take 40 mg by mouth daily as needed (acid reflux).    ? ibuprofen (ADVIL) 200 MG tablet Take 400 mg by mouth every 6 (six) hours as needed for headache or mild pain.    ? Lactobacillus-Inulin (CULTURELLE DIGESTIVE HEALTH PO) Take 1 capsule by mouth daily.    ? levocetirizine (XYZAL) 5 MG tablet TAKE (1) TABLET BY MOUTH TWICE DAILY. (Patient taking differently: Take 5 mg by mouth every evening.) 60 tablet 2  ? montelukast (SINGULAIR) 10 MG tablet TAKE (1) TABLET BY MOUTH ONCE DAILY. (Patient taking differently: Take 10 mg by mouth at bedtime.) 30 tablet 2  ? pregabalin (LYRICA) 300 MG capsule Take 300 mg by mouth at bedtime.    ? propranolol (INDERAL) 20 MG tablet Take 20 mg by mouth at bedtime.    ? zolpidem (AMBIEN) 10 MG tablet Take 5 mg by mouth at bedtime.  1  ? ALPRAZolam (XANAX) 0.5 MG tablet Take 0.25 mg by mouth daily as needed for anxiety.     ? ? ?No results found for this or any previous visit (from the past 48 hour(s)). ?No results found. ? ?Review of Systems ? ?Blood pressure 122/76, pulse 84, temperature 98.5 ?F (36.9 ?C), temperature source Oral, resp. rate 15, height '5\' 5"'$  (1.651 m), weight 78 kg, last menstrual period 01/30/1992, SpO2 100 %. ?Physical Exam ?HENT:  ?   Mouth/Throat:  ?   Mouth: Mucous membranes are moist.  ?   Pharynx: Oropharynx is clear.  ?Eyes:  ?   General: No scleral icterus. ?   Conjunctiva/sclera: Conjunctivae normal.  ?Cardiovascular:  ?   Rate and Rhythm: Normal rate and regular rhythm.  ?   Heart sounds: Normal heart sounds. No murmur heard. ?Pulmonary:  ?   Effort: Pulmonary effort is  normal.  ?   Breath sounds: Normal breath sounds.  ?Abdominal:  ?   General: There is no distension.  ?   Palpations: Abdomen is soft. There is no mass.  ?   Tenderness: There is no abdominal tenderness.  ?Musculoskeletal:     ?   General: No swelling.  ?   Cervical back: Neck supple.  ?Lymphadenopathy:  ?   Cervical: No cervical adenopathy.  ?Skin: ?   General: Skin is warm and dry.  ?Neurological:  ?   Mental Status: She is alert.  ?  ? ?Assessment/Plan ? ?History of colonic polyps. ?Family  history of colon carcinoma first-degree relative. ?Surveillance colonoscopy. ? ?Hildred Laser, MD ?05/18/2021, 7:32 AM ? ? ? ?

## 2021-05-18 NOTE — Transfer of Care (Signed)
Immediate Anesthesia Transfer of Care Note ? ?Patient: Joyce Herrera ? ?Procedure(s) Performed: COLONOSCOPY WITH PROPOFOL ?POLYPECTOMY INTESTINAL ? ?Patient Location: PACU ? ?Anesthesia Type:General ? ?Level of Consciousness: awake, alert , oriented and patient cooperative ? ?Airway & Oxygen Therapy: Patient Spontanous Breathing ? ?Post-op Assessment: Report given to RN, Post -op Vital signs reviewed and stable and Patient moving all extremities X 4 ? ?Post vital signs: Reviewed and stable ? ?Last Vitals:  ?Vitals Value Taken Time  ?BP 81/50 05/18/21 0808  ?Temp 36.8 ?C 05/18/21 0808  ?Pulse 78 05/18/21 0808  ?Resp 13 05/18/21 0808  ?SpO2 95 % 05/18/21 0808  ? ? ?Last Pain:  ?Vitals:  ? 05/18/21 0808  ?TempSrc: Oral  ?PainSc: 0-No pain  ?   ? ?Patients Stated Pain Goal: 7 (05/18/21 0708) ? ?Complications: No notable events documented. ?

## 2021-05-18 NOTE — Anesthesia Postprocedure Evaluation (Signed)
Anesthesia Post Note ? ?Patient: EMBERLIE GOTCHER ? ?Procedure(s) Performed: COLONOSCOPY WITH PROPOFOL ?POLYPECTOMY INTESTINAL ? ?Patient location during evaluation: Phase II ?Anesthesia Type: General ?Level of consciousness: awake ?Pain management: pain level controlled ?Vital Signs Assessment: post-procedure vital signs reviewed and stable ?Respiratory status: spontaneous breathing and respiratory function stable ?Cardiovascular status: blood pressure returned to baseline and stable ?Postop Assessment: no headache and no apparent nausea or vomiting ?Anesthetic complications: no ?Comments: Late entry ? ? ?No notable events documented. ? ? ?Last Vitals:  ?Vitals:  ? 05/18/21 0808 05/18/21 0818  ?BP: (!) 81/50 (!) 101/57  ?Pulse: 78   ?Resp: 13   ?Temp: 36.8 ?C   ?SpO2: 95%   ?  ?Last Pain:  ?Vitals:  ? 05/18/21 0808  ?TempSrc: Oral  ?PainSc: 0-No pain  ? ? ?  ?  ?  ?  ?  ?  ? ?Louann Sjogren ? ? ? ? ?

## 2021-05-18 NOTE — Op Note (Signed)
[facility] ?Patient Name: Joyce Herrera ?Procedure Date: 05/18/2021 7:24 AM ?MRN: [id number] ?Date of Birth: 14-Mar-1960 ?Attending MD: Hildred Laser , MD ?CSN: [id number] ?Age: 61 ?Admit Type: Outpatient ?Procedure:                Colonoscopy ?Indications:              High risk colon cancer surveillance: Personal  ?                          history of colonic polyps ?Providers:                Hildred Laser, MD, Crystal Page, Nelma Rothman,  ?                          Technician ?Referring MD:             Redmond School, MD ?Medicines:                Propofol per Anesthesia ?Complications:            No immediate complications. ?Estimated Blood Loss:     Estimated blood loss was minimal. ?Procedure:                Pre-Anesthesia Assessment: ?                          - Prior to the procedure, a History and Physical  ?                          was performed, and patient medications and  ?                          allergies were reviewed. The patient's tolerance of  ?                          previous anesthesia was also reviewed. The risks  ?                          and benefits of the procedure and the sedation  ?                          options and risks were discussed with the patient.  ?                          All questions were answered, and informed consent  ?                          was obtained. Prior Anticoagulants: The patient has  ?                          taken no previous anticoagulant or antiplatelet  ?                          agents except for aspirin. ASA Grade Assessment: II  ?                          -  A patient with mild systemic disease. After  ?                          reviewing the risks and benefits, the patient was  ?                          deemed in satisfactory condition to undergo the  ?                          procedure. ?                          After obtaining informed consent, the colonoscope  ?                          was passed under direct vision. Throughout the  ?                           procedure, the patient's blood pressure, pulse, and  ?                          oxygen saturations were monitored continuously. The  ?                          PCF-HQ190L ([id number]) scope was introduced through  ?                          the anus and advanced to the the cecum, identified  ?                          by appendiceal orifice and ileocecal valve. The  ?                          colonoscopy was performed without difficulty. The  ?                          patient tolerated the procedure well. The quality  ?                          of the bowel preparation was adequate. The  ?                          ileocecal valve, appendiceal orifice, and rectum  ?                          were photographed. ?Scope In: 7:42:48 AM ?Scope Out: 8:04:15 AM ?Scope Withdrawal Time: 0 hours 7 minutes 25 seconds  ?Total Procedure Duration: 0 hours 21 minutes 27 seconds  ?Findings: ?     Skin tags were found on perianal exam. ?     A small polyp was found in the cecum. The polyp was sessile. Biopsies  ?     were taken with a cold forceps for histology. The pathology specimen was  ?     placed into Bottle Number 1. ?     A 5 mm  polyp was found in the proximal transverse colon. The polyp was  ?     sessile. The polyp was removed with a cold snare. Resection and  ?     retrieval were complete. The pathology specimen was placed into Bottle  ?     Number 1. ?     Multiple diverticula were found in the sigmoid colon. ?     External and internal hemorrhoids were found during retroflexion. The  ?     hemorrhoids were medium-sized. ?Impression:               - Perianal skin tags found on perianal exam. ?                          - One small polyp in the cecum. Biopsied. ?                          - One 5 mm polyp in the proximal transverse colon,  ?                          removed with a cold snare. Resected and retrieved. ?                          - Diverticulosis in the sigmoid colon. ?                           - External and internal hemorrhoids. ?Moderate Sedation: ?     Per Anesthesia Care ?Recommendation:           - Patient has a contact number available for  ?                          emergencies. The signs and symptoms of potential  ?                          delayed complications were discussed with the  ?                          patient. Return to normal activities tomorrow.  ?                          Written discharge instructions were provided to the  ?                          patient. ?                          - High fiber diet today. ?                          - Continue present medications. ?                          - No aspirin, ibuprofen, naproxen, or other  ?                          non-steroidal anti-inflammatory drugs for 1 day. ?                          -  Await pathology results. ?                          - Repeat colonoscopy in 5 years for surveillance. ?Procedure Code(s):        --- Professional --- ?                          [phone], Colonoscopy, flexible; with removal of  ?                          tumor(s), polyp(s), or other lesion(s) by snare  ?                          technique ?                          45380, 59, Colonoscopy, flexible; with biopsy,  ?                          single or multiple ?Diagnosis Code(s):        --- Professional --- ?                          Z86.010, Personal history of colonic polyps ?                          K63.5, Polyp of colon ?                          K64.8, Other hemorrhoids ?                          K64.4, Residual hemorrhoidal skin tags ?                          K57.30, Diverticulosis of large intestine without  ?                          perforation or abscess without bleeding ?CPT copyright 2019 [employer]. All rights reserved. ?The codes documented in this report are preliminary and upon coder review may  ?be revised to meet current compliance requirements. ?Hildred Laser, MD ?Hildred Laser, MD ?05/18/2021 8:15:49 AM ?This  report has been signed electronically. ?Number of Addenda: 0 ?

## 2021-05-18 NOTE — Anesthesia Preprocedure Evaluation (Signed)
Anesthesia Evaluation  ?Patient identified by MRN, date of birth, ID band ?Patient awake ? ? ? ?Reviewed: ?Allergy & Precautions, H&P , NPO status , Patient's Chart, lab work & pertinent test results, reviewed documented beta blocker date and time  ? ?History of Anesthesia Complications ?(+) PONV and history of anesthetic complications ? ?Airway ?Mallampati: II ? ?TM Distance: >3 FB ?Neck ROM: full ? ? ? Dental ?no notable dental hx. ? ?  ?Pulmonary ?asthma , former smoker,  ?  ?Pulmonary exam normal ?breath sounds clear to auscultation ? ? ? ? ? ? Cardiovascular ?Exercise Tolerance: Good ?negative cardio ROS ? ? ?Rhythm:regular Rate:Normal ? ? ?  ?Neuro/Psych ? Headaches, PSYCHIATRIC DISORDERS Anxiety Depression   ? GI/Hepatic ?Neg liver ROS, GERD  Medicated,  ?Endo/Other  ?Hyperthyroidism  ? Renal/GU ?negative Renal ROS  ?negative genitourinary ?  ?Musculoskeletal ? ? Abdominal ?  ?Peds ? Hematology ?negative hematology ROS ?(+)   ?Anesthesia Other Findings ? ? Reproductive/Obstetrics ?negative OB ROS ? ?  ? ? ? ? ? ? ? ? ? ? ? ? ? ?  ?  ? ? ? ? ? ? ? ? ?Anesthesia Physical ?Anesthesia Plan ? ?ASA: 2 ? ?Anesthesia Plan: General  ? ?Post-op Pain Management:   ? ?Induction:  ? ?PONV Risk Score and Plan: Propofol infusion ? ?Airway Management Planned:  ? ?Additional Equipment:  ? ?Intra-op Plan:  ? ?Post-operative Plan:  ? ?Informed Consent: I have reviewed the patients History and Physical, chart, labs and discussed the procedure including the risks, benefits and alternatives for the proposed anesthesia with the patient or authorized representative who has indicated his/her understanding and acceptance.  ? ? ? ?Dental Advisory Given ? ?Plan Discussed with: CRNA ? ?Anesthesia Plan Comments:   ? ? ? ? ? ? ?Anesthesia Quick Evaluation ? ?

## 2021-05-29 LAB — SURGICAL PATHOLOGY

## 2021-05-31 ENCOUNTER — Encounter (HOSPITAL_COMMUNITY): Payer: Self-pay | Admitting: Internal Medicine

## 2021-06-15 DIAGNOSIS — G894 Chronic pain syndrome: Secondary | ICD-10-CM | POA: Diagnosis not present

## 2021-06-15 DIAGNOSIS — E538 Deficiency of other specified B group vitamins: Secondary | ICD-10-CM | POA: Diagnosis not present

## 2021-06-15 DIAGNOSIS — Z6829 Body mass index (BMI) 29.0-29.9, adult: Secondary | ICD-10-CM | POA: Diagnosis not present

## 2021-06-15 DIAGNOSIS — E782 Mixed hyperlipidemia: Secondary | ICD-10-CM | POA: Diagnosis not present

## 2021-06-15 DIAGNOSIS — I10 Essential (primary) hypertension: Secondary | ICD-10-CM | POA: Diagnosis not present

## 2021-06-15 DIAGNOSIS — H04123 Dry eye syndrome of bilateral lacrimal glands: Secondary | ICD-10-CM | POA: Diagnosis not present

## 2021-06-15 DIAGNOSIS — F329 Major depressive disorder, single episode, unspecified: Secondary | ICD-10-CM | POA: Diagnosis not present

## 2021-06-15 DIAGNOSIS — E663 Overweight: Secondary | ICD-10-CM | POA: Diagnosis not present

## 2021-06-15 DIAGNOSIS — Z1331 Encounter for screening for depression: Secondary | ICD-10-CM | POA: Diagnosis not present

## 2021-06-15 DIAGNOSIS — Z0001 Encounter for general adult medical examination with abnormal findings: Secondary | ICD-10-CM | POA: Diagnosis not present

## 2021-06-15 DIAGNOSIS — K219 Gastro-esophageal reflux disease without esophagitis: Secondary | ICD-10-CM | POA: Diagnosis not present

## 2021-06-16 ENCOUNTER — Ambulatory Visit: Payer: PPO | Admitting: Podiatry

## 2021-06-19 DIAGNOSIS — E039 Hypothyroidism, unspecified: Secondary | ICD-10-CM | POA: Diagnosis not present

## 2021-06-19 DIAGNOSIS — E538 Deficiency of other specified B group vitamins: Secondary | ICD-10-CM | POA: Diagnosis not present

## 2021-06-19 DIAGNOSIS — Z0001 Encounter for general adult medical examination with abnormal findings: Secondary | ICD-10-CM | POA: Diagnosis not present

## 2021-07-07 DIAGNOSIS — M1711 Unilateral primary osteoarthritis, right knee: Secondary | ICD-10-CM | POA: Diagnosis not present

## 2021-07-07 DIAGNOSIS — Z4789 Encounter for other orthopedic aftercare: Secondary | ICD-10-CM | POA: Diagnosis not present

## 2021-07-12 ENCOUNTER — Ambulatory Visit: Payer: PPO | Admitting: Podiatry

## 2021-07-12 ENCOUNTER — Ambulatory Visit (INDEPENDENT_AMBULATORY_CARE_PROVIDER_SITE_OTHER): Payer: PPO

## 2021-07-12 ENCOUNTER — Encounter: Payer: Self-pay | Admitting: Podiatry

## 2021-07-12 DIAGNOSIS — M722 Plantar fascial fibromatosis: Secondary | ICD-10-CM

## 2021-07-12 MED ORDER — TRIAMCINOLONE ACETONIDE 10 MG/ML IJ SUSP
20.0000 mg | Freq: Once | INTRAMUSCULAR | Status: AC
  Administered 2021-07-12: 20 mg

## 2021-07-12 NOTE — Progress Notes (Signed)
Subjective:   Patient ID: Joyce Herrera, female   DOB: 61 y.o.   MRN: [id number]   HPI Patient presents stating the heels on both feet are hurting and her nail right is a little bit thick   ROS      Objective:  Physical Exam  Neurovascular status intact exquisite discomfort plantar aspect heel region bilateral insertional point tendon calcaneus with some irritation right hallux nail      Assessment:  Acute plantar fasciitis bilateral and nail that is dystrophic right big toe     Plan:  H&P done sterile prep injected the plantar fascia at insertion bilateral 3 mg Kenalog 5 mg Xylocaine debrided the nail right reappoint as needed

## 2021-09-08 DIAGNOSIS — Z6829 Body mass index (BMI) 29.0-29.9, adult: Secondary | ICD-10-CM | POA: Diagnosis not present

## 2021-09-08 DIAGNOSIS — J0110 Acute frontal sinusitis, unspecified: Secondary | ICD-10-CM | POA: Diagnosis not present

## 2021-09-11 IMAGING — DX DG ANKLE COMPLETE 3+V*R*
3 series · 3 of 3 positions shown · non-contrast
Comparison: None.

CLINICAL DATA: Rolled ankle yesterday.

EXAM:
RIGHT ANKLE - COMPLETE 3+ VIEW

[ankle ap]
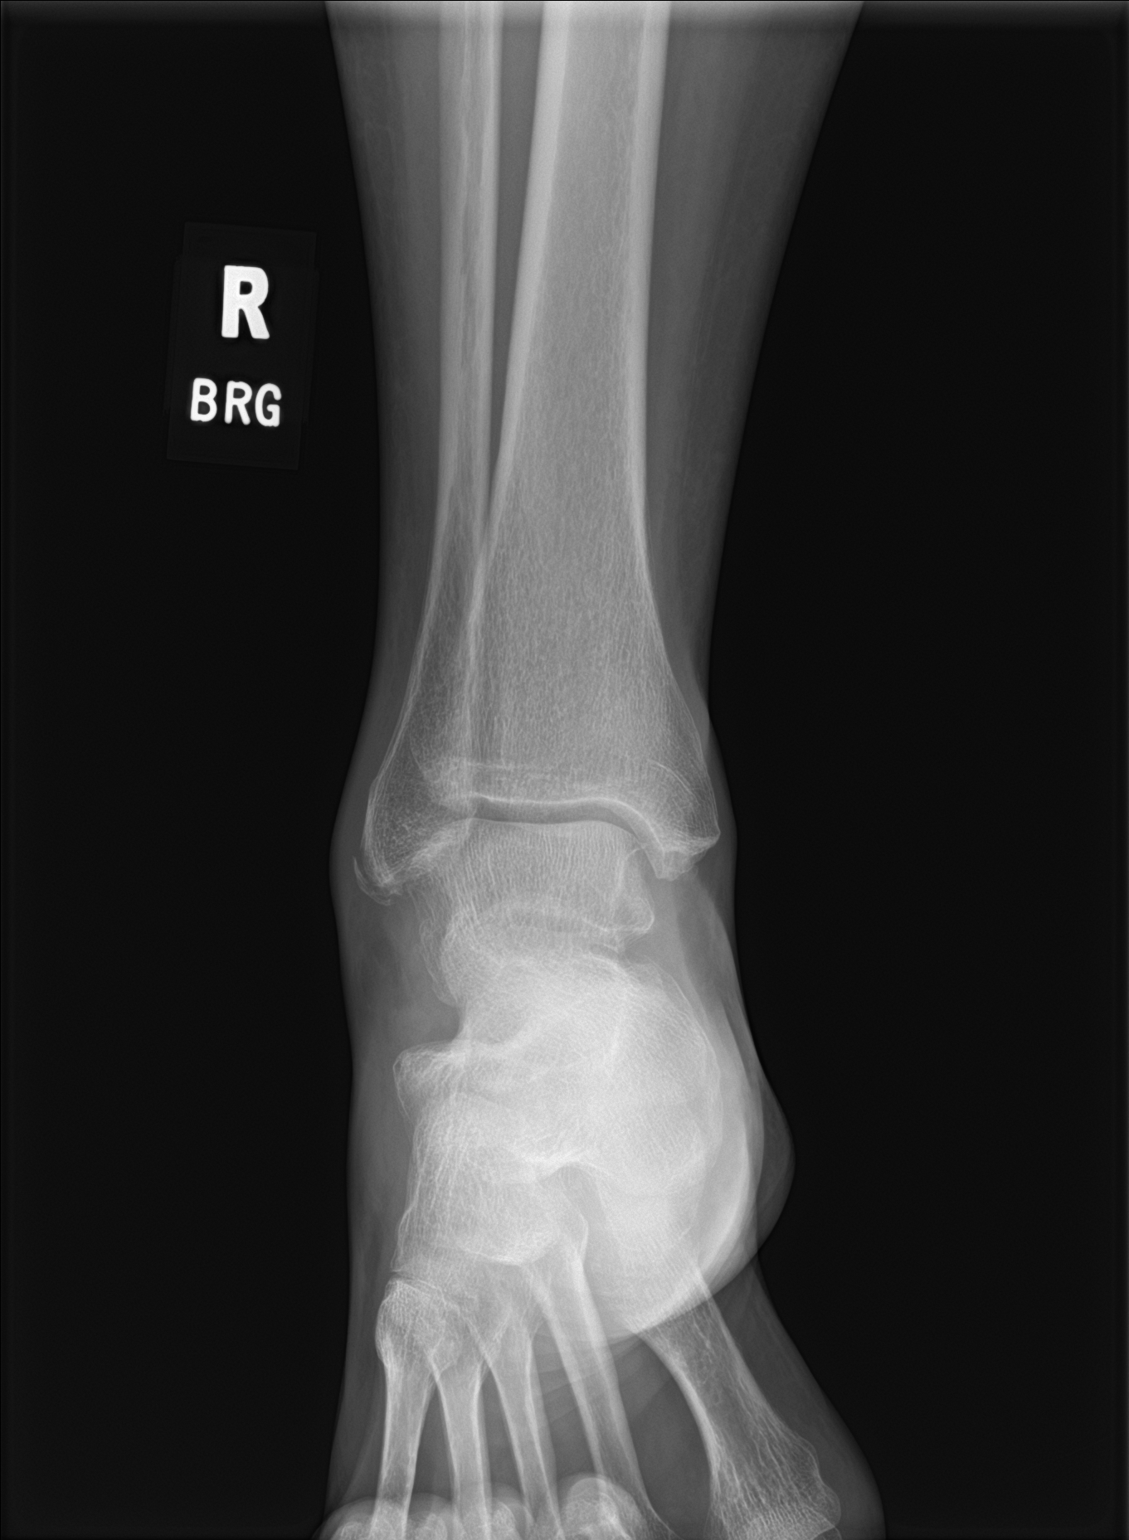

[ankle obl]
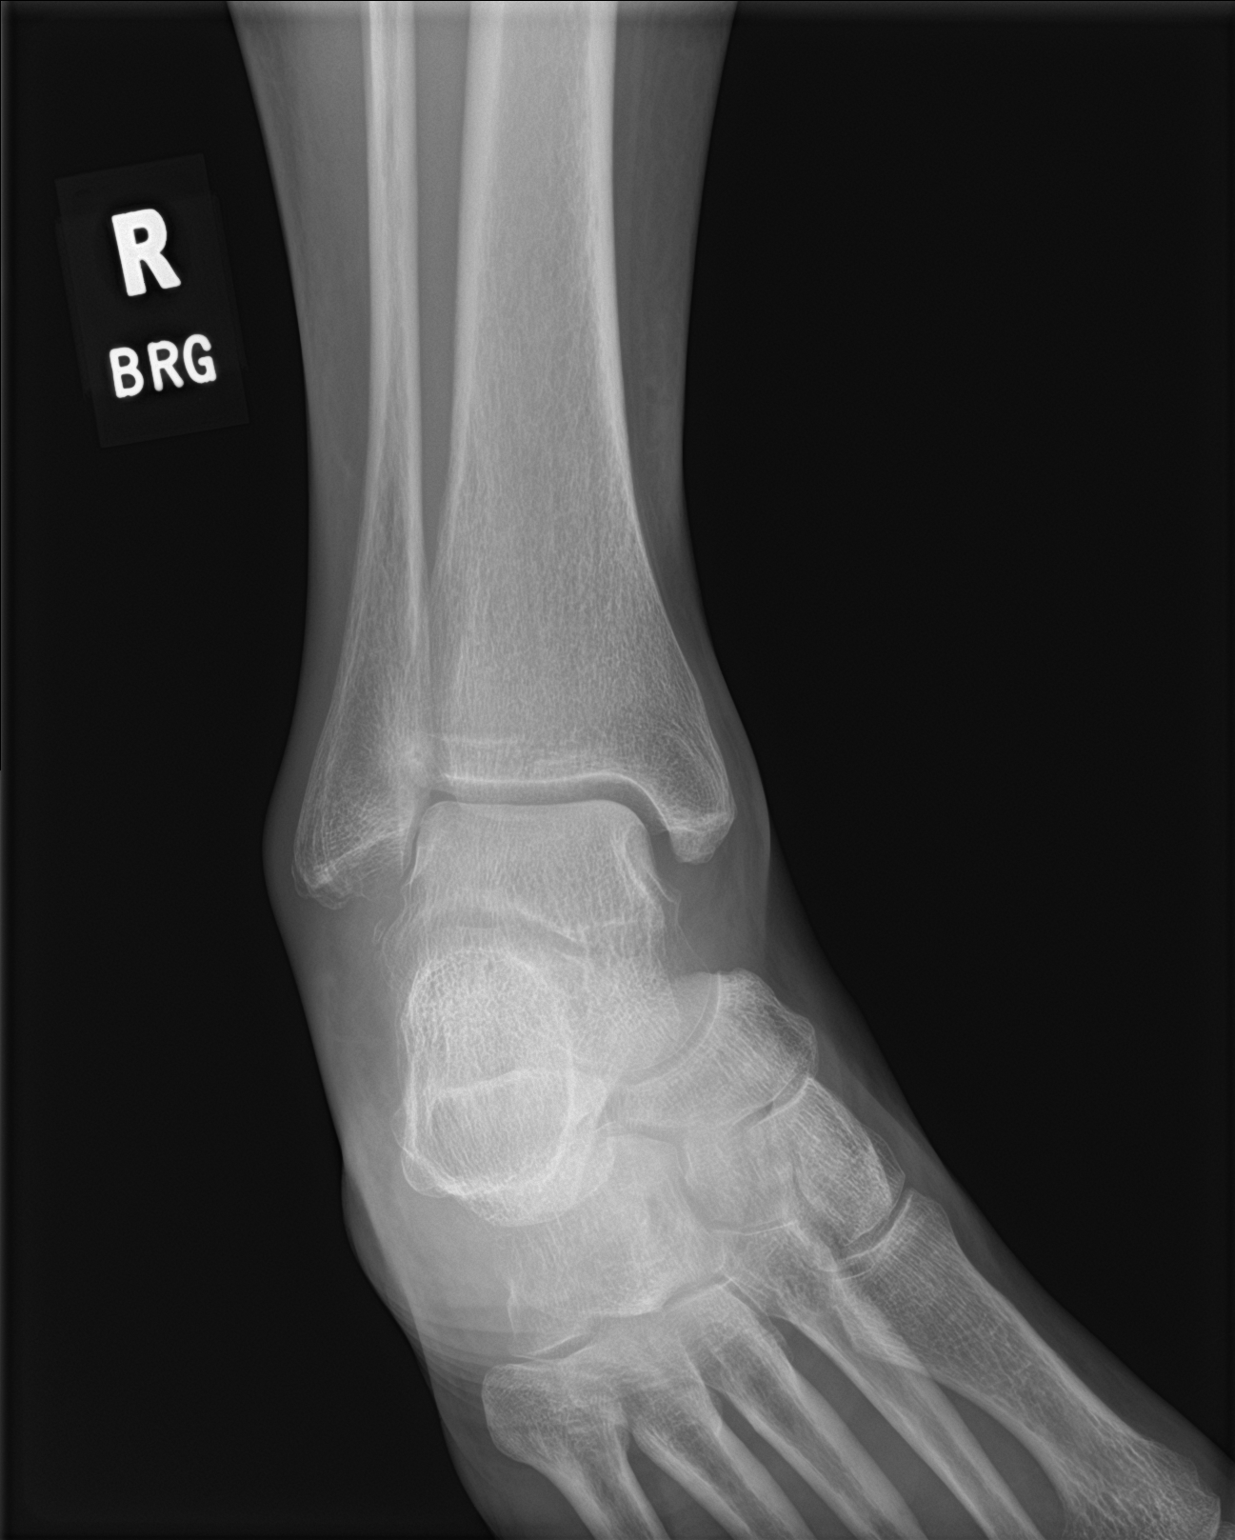

[ankle lat]
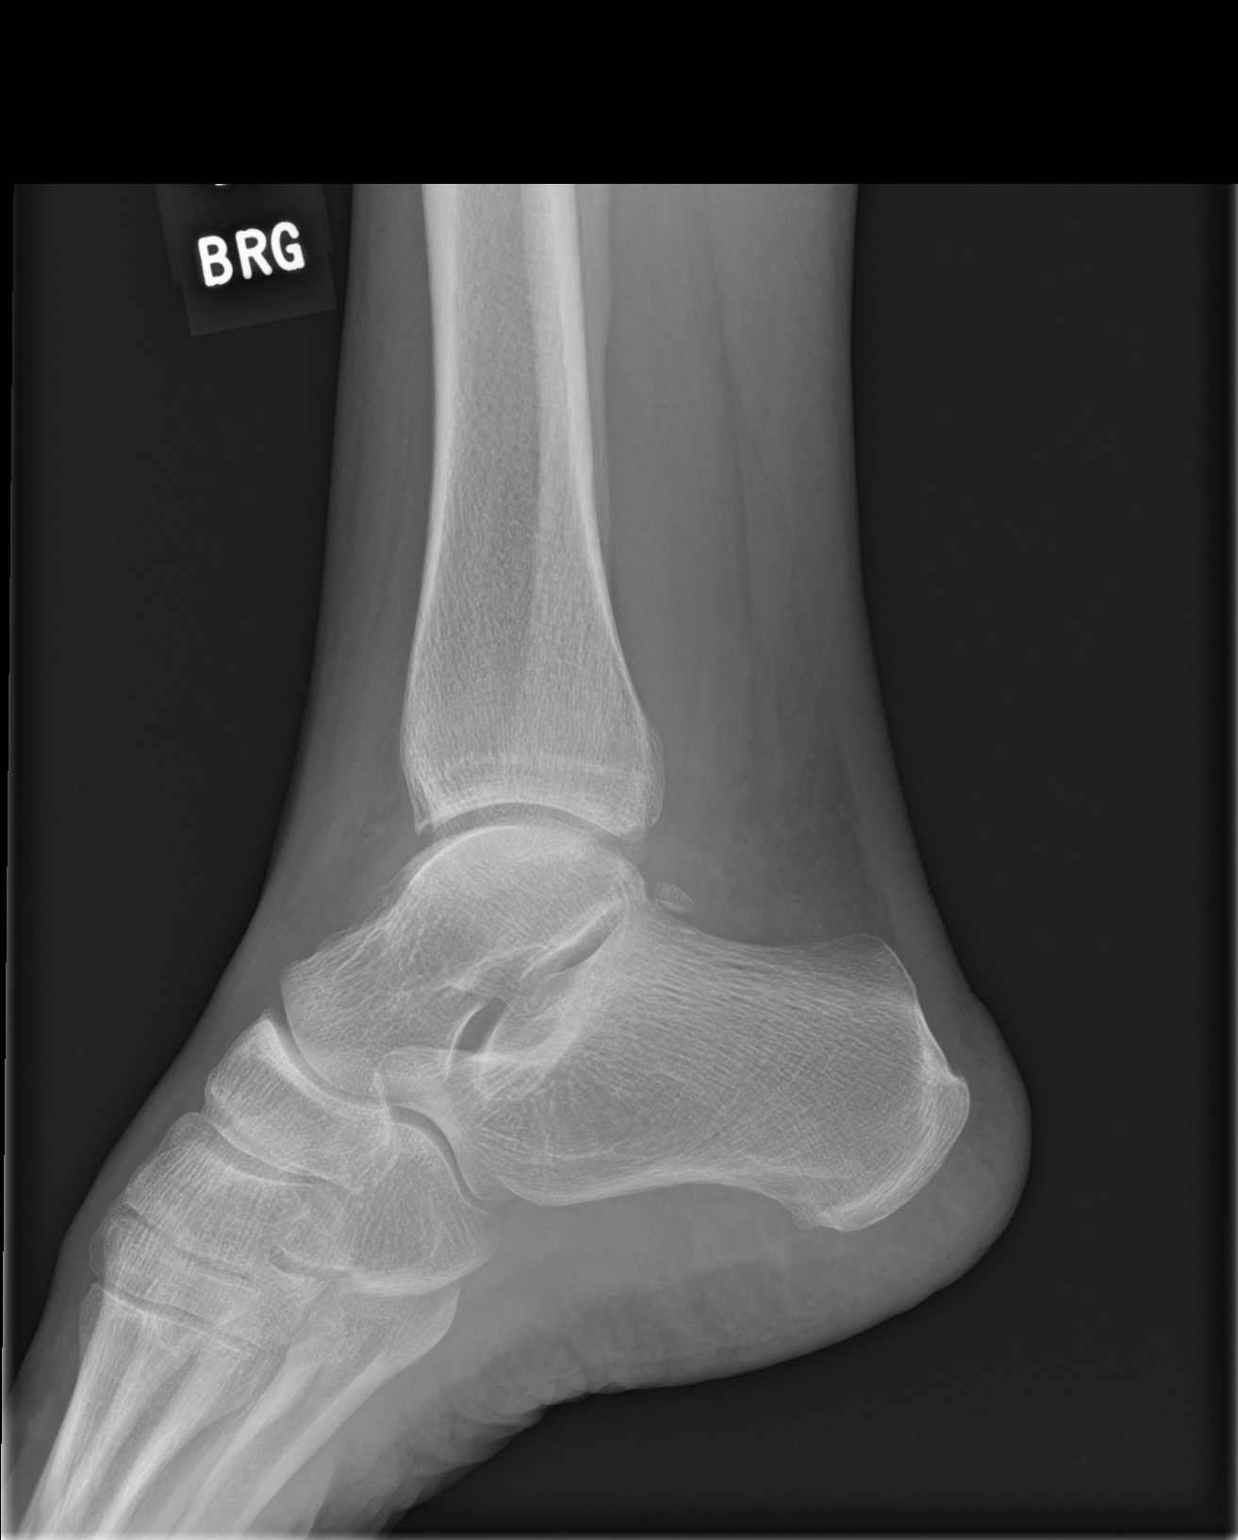

[3 of 3 positions shown; findings below may reference images not displayed]

FINDINGS: There is a crescent shaped soft tissue calcification adjacent to the
distal tip of the fibula which is favored to be acute. No other
abnormalities.
IMPRESSION: 1. Sequela of a lateral malleolar avulsion injury off the distal tip
of the fibula, favored to be acute given appearance and history. No
other acute abnormalities are identified.

## 2021-10-04 ENCOUNTER — Other Ambulatory Visit (HOSPITAL_COMMUNITY): Payer: Self-pay | Admitting: Internal Medicine

## 2021-10-04 DIAGNOSIS — Z1231 Encounter for screening mammogram for malignant neoplasm of breast: Secondary | ICD-10-CM

## 2021-11-06 ENCOUNTER — Ambulatory Visit (HOSPITAL_COMMUNITY)
Admission: RE | Admit: 2021-11-06 | Discharge: 2021-11-06 | Disposition: A | Discharge status: Home / Self Care | Payer: PPO | Source: Ambulatory Visit | Attending: Internal Medicine | Admitting: Internal Medicine

## 2021-11-06 DIAGNOSIS — Z1231 Encounter for screening mammogram for malignant neoplasm of breast: Secondary | ICD-10-CM | POA: Diagnosis not present

## 2021-11-16 DIAGNOSIS — M1711 Unilateral primary osteoarthritis, right knee: Secondary | ICD-10-CM | POA: Diagnosis not present

## 2021-11-16 DIAGNOSIS — Z471 Aftercare following joint replacement surgery: Secondary | ICD-10-CM | POA: Diagnosis not present

## 2021-11-16 DIAGNOSIS — Z96652 Presence of left artificial knee joint: Secondary | ICD-10-CM | POA: Diagnosis not present

## 2021-11-30 ENCOUNTER — Ambulatory Visit: Payer: PPO | Admitting: Podiatry

## 2021-11-30 DIAGNOSIS — M722 Plantar fascial fibromatosis: Secondary | ICD-10-CM

## 2021-11-30 MED ORDER — TRIAMCINOLONE ACETONIDE 10 MG/ML IJ SUSP
20.0000 mg | Freq: Once | INTRAMUSCULAR | Status: AC
  Administered 2021-11-30: 20 mg

## 2021-11-30 NOTE — Progress Notes (Signed)
Subjective:   Patient ID: Joyce Herrera, female   DOB: 61 y.o.   MRN: [id number]   HPI Patient states her heel started to hurt her in the last couple weeks and she thinks she did it she knows she would like to wait longer than this but its been almost 5 months   ROS      Objective:  Physical Exam  There are status intact exquisite discomfort noted medial fascial band bilateral at the insertional point of the tendon into the calcaneus     Assessment:  Acute plantar fasciitis bilateral at insertion calcaneus     Plan:  H&P reviewed condition sterile prep injected the fascia at insertion 3 mg Kenalog 5 mg Xylocaine applied fascial dressing to each area and patient will reduce activity be seen back to recheck as needed

## 2021-12-18 DIAGNOSIS — D225 Melanocytic nevi of trunk: Secondary | ICD-10-CM | POA: Diagnosis not present

## 2021-12-18 DIAGNOSIS — Z1283 Encounter for screening for malignant neoplasm of skin: Secondary | ICD-10-CM | POA: Diagnosis not present

## 2021-12-18 DIAGNOSIS — L821 Other seborrheic keratosis: Secondary | ICD-10-CM | POA: Diagnosis not present

## 2021-12-18 DIAGNOSIS — L918 Other hypertrophic disorders of the skin: Secondary | ICD-10-CM | POA: Diagnosis not present

## 2022-02-02 DIAGNOSIS — Z96652 Presence of left artificial knee joint: Secondary | ICD-10-CM | POA: Diagnosis not present

## 2022-02-07 DIAGNOSIS — M6281 Muscle weakness (generalized): Secondary | ICD-10-CM | POA: Diagnosis not present

## 2022-02-07 DIAGNOSIS — M25562 Pain in left knee: Secondary | ICD-10-CM | POA: Diagnosis not present

## 2022-02-08 DIAGNOSIS — E538 Deficiency of other specified B group vitamins: Secondary | ICD-10-CM | POA: Diagnosis not present

## 2022-02-08 DIAGNOSIS — E782 Mixed hyperlipidemia: Secondary | ICD-10-CM | POA: Diagnosis not present

## 2022-02-08 DIAGNOSIS — H04123 Dry eye syndrome of bilateral lacrimal glands: Secondary | ICD-10-CM | POA: Diagnosis not present

## 2022-02-08 DIAGNOSIS — E063 Autoimmune thyroiditis: Secondary | ICD-10-CM | POA: Diagnosis not present

## 2022-02-08 DIAGNOSIS — Z6830 Body mass index (BMI) 30.0-30.9, adult: Secondary | ICD-10-CM | POA: Diagnosis not present

## 2022-02-08 DIAGNOSIS — J3089 Other allergic rhinitis: Secondary | ICD-10-CM | POA: Diagnosis not present

## 2022-02-08 DIAGNOSIS — E6609 Other obesity due to excess calories: Secondary | ICD-10-CM | POA: Diagnosis not present

## 2022-02-12 DIAGNOSIS — E7849 Other hyperlipidemia: Secondary | ICD-10-CM | POA: Diagnosis not present

## 2022-02-12 DIAGNOSIS — E063 Autoimmune thyroiditis: Secondary | ICD-10-CM | POA: Diagnosis not present

## 2022-02-12 DIAGNOSIS — E538 Deficiency of other specified B group vitamins: Secondary | ICD-10-CM | POA: Diagnosis not present

## 2022-02-14 DIAGNOSIS — M6281 Muscle weakness (generalized): Secondary | ICD-10-CM | POA: Diagnosis not present

## 2022-02-14 DIAGNOSIS — M25562 Pain in left knee: Secondary | ICD-10-CM | POA: Diagnosis not present

## 2022-02-15 DIAGNOSIS — M25562 Pain in left knee: Secondary | ICD-10-CM | POA: Diagnosis not present

## 2022-02-15 DIAGNOSIS — M6281 Muscle weakness (generalized): Secondary | ICD-10-CM | POA: Diagnosis not present

## 2022-02-19 DIAGNOSIS — M25562 Pain in left knee: Secondary | ICD-10-CM | POA: Diagnosis not present

## 2022-02-19 DIAGNOSIS — M6281 Muscle weakness (generalized): Secondary | ICD-10-CM | POA: Diagnosis not present

## 2022-02-21 DIAGNOSIS — M6281 Muscle weakness (generalized): Secondary | ICD-10-CM | POA: Diagnosis not present

## 2022-02-21 DIAGNOSIS — M25562 Pain in left knee: Secondary | ICD-10-CM | POA: Diagnosis not present

## 2022-02-26 DIAGNOSIS — M25562 Pain in left knee: Secondary | ICD-10-CM | POA: Diagnosis not present

## 2022-02-26 DIAGNOSIS — M6281 Muscle weakness (generalized): Secondary | ICD-10-CM | POA: Diagnosis not present

## 2022-02-27 DIAGNOSIS — M25562 Pain in left knee: Secondary | ICD-10-CM | POA: Diagnosis not present

## 2022-02-27 DIAGNOSIS — M6281 Muscle weakness (generalized): Secondary | ICD-10-CM | POA: Diagnosis not present

## 2022-03-07 DIAGNOSIS — M6281 Muscle weakness (generalized): Secondary | ICD-10-CM | POA: Diagnosis not present

## 2022-03-07 DIAGNOSIS — M25562 Pain in left knee: Secondary | ICD-10-CM | POA: Diagnosis not present

## 2022-03-16 DIAGNOSIS — Z96652 Presence of left artificial knee joint: Secondary | ICD-10-CM | POA: Diagnosis not present

## 2022-03-29 ENCOUNTER — Encounter: Payer: Self-pay | Admitting: Radiology

## 2022-06-15 DIAGNOSIS — M1711 Unilateral primary osteoarthritis, right knee: Secondary | ICD-10-CM | POA: Diagnosis not present

## 2022-06-27 ENCOUNTER — Ambulatory Visit (INDEPENDENT_AMBULATORY_CARE_PROVIDER_SITE_OTHER): Payer: PPO | Admitting: Podiatry

## 2022-06-27 ENCOUNTER — Encounter: Payer: Self-pay | Admitting: Podiatry

## 2022-06-27 DIAGNOSIS — M722 Plantar fascial fibromatosis: Secondary | ICD-10-CM

## 2022-06-27 MED ORDER — TRIAMCINOLONE ACETONIDE 10 MG/ML IJ SUSP
20.0000 mg | Freq: Once | INTRAMUSCULAR | Status: AC
  Administered 2022-06-27: 20 mg

## 2022-06-27 NOTE — Progress Notes (Signed)
Subjective:   Patient ID: Joyce Herrera, female   DOB: 62 y.o.   MRN: [id number]   HPI Patient presents with a lot of pain in the heels again stated she had a good 6 months of complete relief and states that her orthotics are now over 15 years old and are no longer holding up her arch properly   ROS      Objective:  Physical Exam  Neuro vascular status intact intense discomfort medial fascial band bilateral with pain and deformity of foot structure with a moderate collapse medial longitudinal arch bilateral with chronic fascial inflammation     Assessment:  Chronic Planter fasciitis bilateral that does get better for periods of time with orthotics which been a great benefit but over 8 years old and are losing their ability to hold the arch up properly     Plan:  H&P reviewed with her the importance of home therapy stretching exercises night splint and long-term orthotics and we will get approval to have her casted for a new pair due to the benefit they will provide and keeping this condition in a and avoiding surgery.  Today I did go ahead and reinjected the plantar fascia bilateral 3 mg Kenalog 5 mg Xylocaine

## 2022-07-05 DIAGNOSIS — G894 Chronic pain syndrome: Secondary | ICD-10-CM | POA: Diagnosis not present

## 2022-07-05 DIAGNOSIS — Z1331 Encounter for screening for depression: Secondary | ICD-10-CM | POA: Diagnosis not present

## 2022-07-05 DIAGNOSIS — E663 Overweight: Secondary | ICD-10-CM | POA: Diagnosis not present

## 2022-07-05 DIAGNOSIS — E063 Autoimmune thyroiditis: Secondary | ICD-10-CM | POA: Diagnosis not present

## 2022-07-05 DIAGNOSIS — E538 Deficiency of other specified B group vitamins: Secondary | ICD-10-CM | POA: Diagnosis not present

## 2022-07-05 DIAGNOSIS — E782 Mixed hyperlipidemia: Secondary | ICD-10-CM | POA: Diagnosis not present

## 2022-07-05 DIAGNOSIS — K219 Gastro-esophageal reflux disease without esophagitis: Secondary | ICD-10-CM | POA: Diagnosis not present

## 2022-07-05 DIAGNOSIS — I10 Essential (primary) hypertension: Secondary | ICD-10-CM | POA: Diagnosis not present

## 2022-07-05 DIAGNOSIS — Z0001 Encounter for general adult medical examination with abnormal findings: Secondary | ICD-10-CM | POA: Diagnosis not present

## 2022-07-05 DIAGNOSIS — G4709 Other insomnia: Secondary | ICD-10-CM | POA: Diagnosis not present

## 2022-07-05 DIAGNOSIS — Z6829 Body mass index (BMI) 29.0-29.9, adult: Secondary | ICD-10-CM | POA: Diagnosis not present

## 2022-07-05 DIAGNOSIS — J3089 Other allergic rhinitis: Secondary | ICD-10-CM | POA: Diagnosis not present

## 2022-07-09 DIAGNOSIS — Z0001 Encounter for general adult medical examination with abnormal findings: Secondary | ICD-10-CM | POA: Diagnosis not present

## 2022-07-09 DIAGNOSIS — E063 Autoimmune thyroiditis: Secondary | ICD-10-CM | POA: Diagnosis not present

## 2022-07-09 DIAGNOSIS — E7849 Other hyperlipidemia: Secondary | ICD-10-CM | POA: Diagnosis not present

## 2022-07-09 DIAGNOSIS — E538 Deficiency of other specified B group vitamins: Secondary | ICD-10-CM | POA: Diagnosis not present

## 2022-07-09 DIAGNOSIS — E559 Vitamin D deficiency, unspecified: Secondary | ICD-10-CM | POA: Diagnosis not present

## 2022-07-09 DIAGNOSIS — I10 Essential (primary) hypertension: Secondary | ICD-10-CM | POA: Diagnosis not present

## 2022-10-03 ENCOUNTER — Ambulatory Visit
Admission: RE | Admit: 2022-10-03 | Discharge: 2022-10-03 | Disposition: A | Discharge status: Home / Self Care | Payer: PPO | Source: Ambulatory Visit | Attending: Nurse Practitioner | Admitting: Nurse Practitioner

## 2022-10-03 ENCOUNTER — Other Ambulatory Visit: Payer: Self-pay

## 2022-10-03 VITALS — BP 119/90 | HR 94 | Temp 98.1°F | Resp 20

## 2022-10-03 DIAGNOSIS — H9202 Otalgia, left ear: Secondary | ICD-10-CM

## 2022-10-03 DIAGNOSIS — J0140 Acute pansinusitis, unspecified: Secondary | ICD-10-CM | POA: Diagnosis not present

## 2022-10-03 MED ORDER — DOXYCYCLINE HYCLATE 100 MG PO TABS: 100.0000 mg | ORAL_TABLET | Freq: Two times a day (BID) | ORAL | 0 refills | Status: AC

## 2022-10-03 MED ORDER — PROMETHAZINE-DM 6.25-15 MG/5ML PO SYRP: 5.0000 mL | ORAL_SOLUTION | Freq: Four times a day (QID) | ORAL | 0 refills | Status: DC | PRN

## 2022-10-03 MED ORDER — FLUTICASONE PROPIONATE 50 MCG/ACT NA SUSP: 2.0000 | Freq: Every day | NASAL | 0 refills | Status: DC

## 2022-10-03 NOTE — Discharge Instructions (Signed)
Take medication as directed. Continue your current allergy medication regimen. Increase fluids and get plenty of rest. May take over-the-counter Tylenol as needed for pain, fever, or general discomfort. Recommend normal saline nasal spray to help with nasal congestion throughout the day. For your cough, it may be helpful to use a humidifier at bedtime during sleep. Warm compresses to the left ear to help with pain or discomfort. Avoid getting water inside of the ear while symptoms persist. If symptoms do not improve with this treatment, please follow-up with your primary care physician or in this clinic for further evaluation. Follow-up as needed.

## 2022-10-03 NOTE — ED Provider Notes (Signed)
[facility]    CSN: [id number] Arrival date & time: 10/03/22  1826      History   Chief Complaint Chief Complaint  Patient presents with   Ear Fullness    Nose stopped  stopped up left ear, occasionally  wheezing . - Entered by patient    HPI Joyce Herrera is a 62 y.o. female.   The history is provided by the patient.   The patient presents with a 1 month history of left ear/facial pain, intermittent headache and pressure along with nasal congestion and cough.  Patient states symptoms are worse on the left side of her face.  She also endorses ringing in the left ear along with intermittent muffled hearing.  Patient denies fever, chills, sore throat, wheezing, shortness of breath, difficulty breathing, chest pain, abdominal pain, nausea, vomiting, or diarrhea.  Patient states that she has tried several over-the-counter medications to include antihistamines, decongestants, and cough and cold medications with minimal relief.  Past Medical History:  Diagnosis Date   Anxiety    Arthritis    Asthma    Depression    GERD (gastroesophageal reflux disease)    Headache(784.0)    History of colon polyps    Hyperthyroidism    Palpitations    Plantar fasciitis    Pneumonia    PONV (postoperative nausea and vomiting)    Seasonal allergies     Patient Active Problem List   Diagnosis Date Noted   OA (osteoarthritis) of knee 02/01/2021   S/P total knee arthroplasty, left 02/01/2021   Grade III hemorrhoids 12/13/2020   Esophageal dysphagia 05/21/2017   Gastroesophageal reflux disease 04/23/2017   Vaginal irritation 07/19/2016   Urinary frequency 07/19/2016   Pelvic relaxation due to cystocele, midline 07/19/2016   Atrophic vaginitis 07/19/2016   Seasonal allergic rhinitis due to pollen 07/17/2016   Moderate persistent asthma, uncomplicated 07/17/2016   Drug allergy 07/17/2016   Chronic nonintractable headache 07/17/2016   Allergic contact dermatitis due to metals  07/17/2016   Class 1 obesity due to excess calories without serious comorbidity with body mass index (BMI) of 30.0 to 30.9 in adult 03/14/2016   Family hx of colon cancer 09/16/2015   History of colonic polyps 09/16/2015   Cough 09/15/2015   Laryngopharyngeal reflux (LPR) 09/15/2015   Subclinical hyperthyroidism 08/24/2015   Hyperlipidemia 08/24/2015    Past Surgical History:  Procedure Laterality Date   ABDOMINAL HYSTERECTOMY     BIOPSY  10/23/2017   Procedure: BIOPSY;  Surgeon: Malissa Hippo, MD;  Location: [facility];  Service: Endoscopy;;  gastric   CHOLECYSTECTOMY     COLONOSCOPY  09/14/2010   Procedure: COLONOSCOPY;  Surgeon: Malissa Hippo, MD;  Location: [facility];  Service: Endoscopy;  Laterality: N/A;   COLONOSCOPY N/A 01/11/2016   Procedure: COLONOSCOPY;  Surgeon: Malissa Hippo, MD;  Location: [facility];  Service: Endoscopy;  Laterality: N/A;  930   COLONOSCOPY WITH PROPOFOL N/A 05/18/2021   Procedure: COLONOSCOPY WITH PROPOFOL;  Surgeon: Malissa Hippo, MD;  Location: [facility];  Service: Endoscopy;  Laterality: N/A;  730   CONVERSION TO TOTAL KNEE Left 02/01/2021   Procedure: Revision left knee unicompartmental arthroplasty to total knee arthroplasty;  Surgeon: Ollen Gross, MD;  Location: [facility];  Service: Orthopedics;  Laterality: Left;   ESOPHAGEAL DILATION N/A 10/23/2017   Procedure: ESOPHAGEAL DILATION;  Surgeon: Malissa Hippo, MD;  Location: [facility];  Service: Endoscopy;  Laterality: N/A;   ESOPHAGOGASTRODUODENOSCOPY  N/A 10/23/2017   Procedure: ESOPHAGOGASTRODUODENOSCOPY (EGD);  Surgeon: Malissa Hippo, MD;  Location: [facility];  Service: Endoscopy;  Laterality: N/A;  11:25-rescheduled to 9/25 @ 8:30am per Dewayne Hatch   MEDIAL PARTIAL KNEE REPLACEMENT Left    NASAL SEPTUM SURGERY     PLANTAR FASCIECTOMY     bilateral   POLYPECTOMY  01/11/2016   Procedure: POLYPECTOMY;  Surgeon: Malissa Hippo, MD;  Location: [facility];  Service:  Endoscopy;;  multiple colon polyps    POLYPECTOMY  05/18/2021   Procedure: POLYPECTOMY INTESTINAL;  Surgeon: Malissa Hippo, MD;  Location: [facility];  Service: Endoscopy;;   TONSILLECTOMY      OB History     Gravida  1   Para  1   Term      Preterm      AB      Living  1      SAB      IAB      Ectopic      Multiple      Live Births               Home Medications    Prior to Admission medications   Medication Sig Start Date End Date Taking? Authorizing Provider  doxycycline (VIBRA-TABS) 100 MG tablet Take 1 tablet (100 mg total) by mouth 2 (two) times daily for 7 days. 10/03/22 10/10/22 Yes Toini Failla-Warren, Sadie Haber, NP  fluticasone (FLONASE) 50 MCG/ACT nasal spray Place 2 sprays into both nostrils daily. 10/03/22  Yes Tariana Moldovan-Warren, Sadie Haber, NP  promethazine-dextromethorphan (PROMETHAZINE-DM) 6.25-15 MG/5ML syrup Take 5 mLs by mouth 4 (four) times daily as needed. 10/03/22  Yes Khanh Tanori-Warren, Sadie Haber, NP  ALPRAZolam Prudy Feeler) 0.5 MG tablet Take 0.25 mg by mouth daily as needed for anxiety.  12/22/12   [provider]  aspirin 325 MG EC tablet Take 1 tablet (325 mg total) by mouth 2 (two) times daily. Take for three weeks, then transition to 81mg  Aspirin once daily for three weeks. Then discontinue Aspirin. 05/19/21   Rehman, Joline Maxcy, MD  atorvastatin (LIPITOR) 10 MG tablet Take 10 mg by mouth at bedtime.    [provider]  CALCIUM CARBONATE-VITAMIN D PO Take 1,200 tablets by mouth at bedtime.    [provider]  dextromethorphan-guaiFENesin (MUCINEX DM) 30-600 MG 12hr tablet Take 1 tablet by mouth 2 (two) times daily as needed (Congestion).    [provider]  Epinastine HCl 0.05 % ophthalmic solution Place 1 drop into both eyes 2 (two) times daily as needed (allergies).    [provider]  esomeprazole (NEXIUM) 40 MG capsule Take 40 mg by mouth daily as needed (acid reflux).    [provider]  ibuprofen  (ADVIL) 200 MG tablet Take 400 mg by mouth every 6 (six) hours as needed for headache or mild pain.    [provider]  Lactobacillus-Inulin (CULTURELLE DIGESTIVE HEALTH PO) Take 1 capsule by mouth daily.    [provider]  levocetirizine (XYZAL) 5 MG tablet TAKE (1) TABLET BY MOUTH TWICE DAILY. Patient taking differently: Take 5 mg by mouth every evening. 05/09/20   Alfonse Spruce, MD  montelukast (SINGULAIR) 10 MG tablet TAKE (1) TABLET BY MOUTH ONCE DAILY. Patient taking differently: Take 10 mg by mouth at bedtime. 05/09/20   Alfonse Spruce, MD  pregabalin (LYRICA) 300 MG capsule Take 300 mg by mouth at bedtime.    [provider]  propranolol (INDERAL) 20 MG tablet  Take 20 mg by mouth at bedtime.    [provider]  zolpidem (AMBIEN) 10 MG tablet Take 5 mg by mouth at bedtime. 07/11/16   [provider]    Family History Family History  Problem Relation Age of Onset   Lung cancer Mother    Kidney cancer Mother    Colon cancer Father    Prostate cancer Father    Diabetes Father    Hypertension Father    Kidney failure Father     Social History Social History   Tobacco Use   Smoking status: Former   Smokeless tobacco: Never   Tobacco comments:    quit October 2004  Vaping Use   Vaping status: Never Used  Substance Use Topics   Alcohol use: Not Currently    Comment: rare   Drug use: No     Allergies   Cephalexin, Nickel, and Penicillins   Review of Systems Review of Systems Per HPI  Physical Exam Triage Vital Signs ED Triage Vitals  Encounter Vitals Group     BP 10/03/22 1901 (!) 119/90     Systolic BP Percentile --      Diastolic BP Percentile --      Pulse Rate 10/03/22 1901 94     Resp 10/03/22 1901 20     Temp 10/03/22 1901 98.1 F (36.7 C)     Temp Source 10/03/22 1901 Oral     SpO2 10/03/22 1901 94 %     Weight --      Height --      Head Circumference --      Peak Flow --      Pain Score  10/03/22 1902 5     Pain Loc --      Pain Education --      Exclude from Growth Chart --    No data found.  Updated Vital Signs BP (!) 119/90 (BP Location: Right Arm)   Pulse 94   Temp 98.1 F (36.7 C) (Oral)   Resp 20   LMP 01/30/1992   SpO2 94%   Visual Acuity Right Eye Distance:   Left Eye Distance:   Bilateral Distance:    Right Eye Near:   Left Eye Near:    Bilateral Near:     Physical Exam Vitals and nursing note reviewed.  Constitutional:      General: She is not in acute distress.    Appearance: Normal appearance.  HENT:     Head: Normocephalic.     Right Ear: Ear canal and external ear normal.     Left Ear: Ear canal and external ear normal.     Nose: Congestion present.     Right Turbinates: Enlarged and swollen.     Left Turbinates: Enlarged and swollen.     Right Sinus: No maxillary sinus tenderness or frontal sinus tenderness.     Left Sinus: Maxillary sinus tenderness and frontal sinus tenderness present.     Mouth/Throat:     Lips: Pink.     Mouth: Mucous membranes are moist.     Pharynx: Oropharynx is clear. Uvula midline. Posterior oropharyngeal erythema and postnasal drip present. No pharyngeal swelling.  Eyes:     Extraocular Movements: Extraocular movements intact.     Pupils: Pupils are equal, round, and reactive to light.  Cardiovascular:     Rate and Rhythm: Normal rate and regular rhythm.     Pulses: Normal pulses.     Heart sounds: Normal heart sounds.  Pulmonary:     Effort: Pulmonary effort is normal. No respiratory distress.     Breath sounds: Normal breath sounds. No stridor. No wheezing, rhonchi or rales.  Abdominal:     General: Bowel sounds are normal.     Palpations: Abdomen is soft.     Tenderness: There is no abdominal tenderness.  Musculoskeletal:     Cervical back: Normal range of motion.  Lymphadenopathy:     Cervical: No cervical adenopathy.  Skin:    General: Skin is warm and dry.  Neurological:     General: No  focal deficit present.     Mental Status: She is alert and oriented to person, place, and time.  Psychiatric:        Mood and Affect: Mood normal.        Behavior: Behavior normal.      UC Treatments / Results  Labs (all labs ordered are listed, but only abnormal results are displayed) Labs Reviewed - No data to display  EKG   Radiology No results found.  Procedures Procedures (including critical care time)  Medications Ordered in UC Medications - No data to display  Initial Impression / Assessment and Plan / UC Course  I have reviewed the triage vital signs and the nursing notes.  Pertinent labs & imaging results that were available during my care of the patient were reviewed by me and considered in my medical decision making (see chart for details).  The patient is well-appearing, she is in no acute distress, vital signs are stable.  Symptoms consistent with acute pansinusitis.  Will treat with doxycycline 100 mg tablets, fluticasone 50 mcg nasal spray for nasal congestion and runny nose, and Promethazine DM for her cough.  Supportive care recommendations were provided and discussed with the patient to include normal saline nasal spray, over-the-counter analgesics, and warm compresses to the left ear as needed for pain or discomfort.  Patient was advised to follow-up in this clinic or with her primary care physician if symptoms do not improve with this treatment.  Patient is in agreement with this plan of care and verbalizes understanding.  All questions were answered.  Patient stable for discharge.  Final Clinical Impressions(s) / UC Diagnoses   Final diagnoses:  Acute pansinusitis, recurrence not specified  Otalgia, left     Discharge Instructions      Take medication as directed. Continue your current allergy medication regimen. Increase fluids and get plenty of rest. May take over-the-counter Tylenol as needed for pain, fever, or general discomfort. Recommend  normal saline nasal spray to help with nasal congestion throughout the day. For your cough, it may be helpful to use a humidifier at bedtime during sleep. Warm compresses to the left ear to help with pain or discomfort. Avoid getting water inside of the ear while symptoms persist. If symptoms do not improve with this treatment, please follow-up with your primary care physician or in this clinic for further evaluation. Follow-up as needed.     ED Prescriptions     Medication Sig Dispense Auth. Provider   doxycycline (VIBRA-TABS) 100 MG tablet Take 1 tablet (100 mg total) by mouth 2 (two) times daily for 7 days. 14 tablet Reizy Dunlow-Warren, Sadie Haber, NP   fluticasone (FLONASE) 50 MCG/ACT nasal spray Place 2 sprays into both nostrils daily. 16 g Eniya Cannady-Warren, Sadie Haber, NP   promethazine-dextromethorphan (PROMETHAZINE-DM) 6.25-15 MG/5ML syrup Take 5 mLs by mouth 4 (four) times daily as needed. 118 mL Camaryn Lumbert-Warren, Sadie Haber, NP  PDMP not reviewed this encounter.   Abran Cantor, NP 10/03/22 1942

## 2022-10-03 NOTE — ED Triage Notes (Signed)
Pt reports left ear/facial pain and pressure x4 weeks.has been using otc medication with no change in symptoms.

## 2022-10-08 ENCOUNTER — Other Ambulatory Visit (HOSPITAL_COMMUNITY): Payer: Self-pay | Admitting: Internal Medicine

## 2022-10-08 DIAGNOSIS — Z1231 Encounter for screening mammogram for malignant neoplasm of breast: Secondary | ICD-10-CM

## 2022-11-09 ENCOUNTER — Encounter (HOSPITAL_COMMUNITY): Payer: Self-pay

## 2022-11-09 ENCOUNTER — Ambulatory Visit (HOSPITAL_COMMUNITY)
Admission: RE | Admit: 2022-11-09 | Discharge: 2022-11-09 | Disposition: A | Discharge status: Home / Self Care | Payer: PPO | Source: Ambulatory Visit | Attending: Internal Medicine | Admitting: Internal Medicine

## 2022-11-09 DIAGNOSIS — Z1231 Encounter for screening mammogram for malignant neoplasm of breast: Secondary | ICD-10-CM | POA: Diagnosis not present

## 2022-11-20 DIAGNOSIS — M1711 Unilateral primary osteoarthritis, right knee: Secondary | ICD-10-CM | POA: Diagnosis not present

## 2022-11-29 ENCOUNTER — Ambulatory Visit: Payer: PPO | Admitting: Podiatry

## 2022-11-29 ENCOUNTER — Ambulatory Visit: Payer: PPO

## 2022-11-29 ENCOUNTER — Encounter: Payer: Self-pay | Admitting: Podiatry

## 2022-11-29 VITALS — Ht 65.0 in | Wt 172.0 lb

## 2022-11-29 DIAGNOSIS — M722 Plantar fascial fibromatosis: Secondary | ICD-10-CM

## 2022-11-29 MED ORDER — TRIAMCINOLONE ACETONIDE 10 MG/ML IJ SUSP
10.0000 mg | Freq: Once | INTRAMUSCULAR | Status: AC
  Administered 2022-11-29: 10 mg via INTRA_ARTICULAR

## 2022-11-29 NOTE — Progress Notes (Signed)
Subjective:   Patient ID: Joyce Herrera, female   DOB: 62 y.o.   MRN: [id number]   HPI Patient presents stating that both heels are hurting her and she needs new orthotics   ROS      Objective:  Physical Exam  Neurovascular status intact with exquisite discomfort in the medial band of the plantar fascia bilateral fluid buildup depression of the arch chronic nature     Assessment:  Acute plantar fasciitis bilateral with inflammation     Plan:  H&P reviewed went ahead today did sterile prep injected the plantar fascia bilateral 3 mg Kenalog 5 mg Xylocaine casted for functional orthotic devices reappoint when ready

## 2022-11-29 NOTE — Progress Notes (Signed)
 Orthotic eval   Patient was seen, measured / scanned for custom molded foot orthotics.  Patient will benefit from CFO's as they will help provide total contact to MLA's helping to better distribute body weight across BIL feet greater reducing plantar pressure and pain and to also encourage FF and RF alignment.  Patient was scanned items to be ordered and fit when in

## 2022-11-30 ENCOUNTER — Ambulatory Visit: Payer: PPO | Admitting: Podiatry

## 2023-01-02 ENCOUNTER — Ambulatory Visit: Payer: PPO

## 2023-01-08 NOTE — Progress Notes (Signed)
Patient presents today to pick up custom molded foot orthotics, diagnosed with PF by Dr. Charlsie Merles.   Orthotics were dispensed and fit was satisfactory. Reviewed instructions for break-in and wear. Written instructions given to patient.  Patient will follow up as needed.   Addison Bailey Cped, CFo, CFm

## 2023-02-01 ENCOUNTER — Ambulatory Visit: Payer: PPO

## 2023-02-01 NOTE — Progress Notes (Signed)
 Fit of re-make is satisfactory patient will try and call if any problems arise  Joyce Herrera CPed, CFo, CFm

## 2023-02-15 DIAGNOSIS — E6609 Other obesity due to excess calories: Secondary | ICD-10-CM | POA: Diagnosis not present

## 2023-02-15 DIAGNOSIS — G894 Chronic pain syndrome: Secondary | ICD-10-CM | POA: Diagnosis not present

## 2023-02-15 DIAGNOSIS — Z6830 Body mass index (BMI) 30.0-30.9, adult: Secondary | ICD-10-CM | POA: Diagnosis not present

## 2023-02-15 DIAGNOSIS — J3089 Other allergic rhinitis: Secondary | ICD-10-CM | POA: Diagnosis not present

## 2023-02-15 DIAGNOSIS — F329 Major depressive disorder, single episode, unspecified: Secondary | ICD-10-CM | POA: Diagnosis not present

## 2023-02-15 DIAGNOSIS — G4709 Other insomnia: Secondary | ICD-10-CM | POA: Diagnosis not present

## 2023-02-15 DIAGNOSIS — E559 Vitamin D deficiency, unspecified: Secondary | ICD-10-CM | POA: Diagnosis not present

## 2023-02-15 DIAGNOSIS — M25551 Pain in right hip: Secondary | ICD-10-CM | POA: Diagnosis not present

## 2023-02-15 DIAGNOSIS — E782 Mixed hyperlipidemia: Secondary | ICD-10-CM | POA: Diagnosis not present

## 2023-02-15 DIAGNOSIS — H04123 Dry eye syndrome of bilateral lacrimal glands: Secondary | ICD-10-CM | POA: Diagnosis not present

## 2023-02-15 DIAGNOSIS — E063 Autoimmune thyroiditis: Secondary | ICD-10-CM | POA: Diagnosis not present

## 2023-02-15 DIAGNOSIS — E538 Deficiency of other specified B group vitamins: Secondary | ICD-10-CM | POA: Diagnosis not present

## 2023-02-22 DIAGNOSIS — M1611 Unilateral primary osteoarthritis, right hip: Secondary | ICD-10-CM | POA: Diagnosis not present

## 2023-02-22 DIAGNOSIS — M25561 Pain in right knee: Secondary | ICD-10-CM | POA: Diagnosis not present

## 2023-02-22 DIAGNOSIS — M545 Low back pain, unspecified: Secondary | ICD-10-CM | POA: Diagnosis not present

## 2023-02-27 DIAGNOSIS — Z471 Aftercare following joint replacement surgery: Secondary | ICD-10-CM | POA: Diagnosis not present

## 2023-02-27 DIAGNOSIS — Z96652 Presence of left artificial knee joint: Secondary | ICD-10-CM | POA: Diagnosis not present

## 2023-03-04 DIAGNOSIS — M1711 Unilateral primary osteoarthritis, right knee: Secondary | ICD-10-CM | POA: Diagnosis not present

## 2023-03-04 DIAGNOSIS — M1611 Unilateral primary osteoarthritis, right hip: Secondary | ICD-10-CM | POA: Diagnosis not present

## 2023-03-11 DIAGNOSIS — M1711 Unilateral primary osteoarthritis, right knee: Secondary | ICD-10-CM | POA: Diagnosis not present

## 2023-03-18 DIAGNOSIS — M1711 Unilateral primary osteoarthritis, right knee: Secondary | ICD-10-CM | POA: Diagnosis not present

## 2023-04-03 DIAGNOSIS — H04123 Dry eye syndrome of bilateral lacrimal glands: Secondary | ICD-10-CM | POA: Diagnosis not present

## 2023-04-04 DIAGNOSIS — Z96652 Presence of left artificial knee joint: Secondary | ICD-10-CM | POA: Diagnosis not present

## 2023-05-22 DIAGNOSIS — M545 Low back pain, unspecified: Secondary | ICD-10-CM | POA: Diagnosis not present

## 2023-05-24 DIAGNOSIS — M6281 Muscle weakness (generalized): Secondary | ICD-10-CM | POA: Diagnosis not present

## 2023-05-24 DIAGNOSIS — M545 Low back pain, unspecified: Secondary | ICD-10-CM | POA: Diagnosis not present

## 2023-05-28 DIAGNOSIS — M6281 Muscle weakness (generalized): Secondary | ICD-10-CM | POA: Diagnosis not present

## 2023-05-28 DIAGNOSIS — M545 Low back pain, unspecified: Secondary | ICD-10-CM | POA: Diagnosis not present

## 2023-05-29 DIAGNOSIS — M6281 Muscle weakness (generalized): Secondary | ICD-10-CM | POA: Diagnosis not present

## 2023-05-29 DIAGNOSIS — M545 Low back pain, unspecified: Secondary | ICD-10-CM | POA: Diagnosis not present

## 2023-05-30 DIAGNOSIS — M170 Bilateral primary osteoarthritis of knee: Secondary | ICD-10-CM | POA: Diagnosis not present

## 2023-05-30 DIAGNOSIS — G8929 Other chronic pain: Secondary | ICD-10-CM | POA: Diagnosis not present

## 2023-05-30 DIAGNOSIS — Z96652 Presence of left artificial knee joint: Secondary | ICD-10-CM | POA: Diagnosis not present

## 2023-06-05 DIAGNOSIS — M545 Low back pain, unspecified: Secondary | ICD-10-CM | POA: Diagnosis not present

## 2023-06-05 DIAGNOSIS — M6281 Muscle weakness (generalized): Secondary | ICD-10-CM | POA: Diagnosis not present

## 2023-06-05 DIAGNOSIS — M25562 Pain in left knee: Secondary | ICD-10-CM | POA: Diagnosis not present

## 2023-06-05 DIAGNOSIS — M25561 Pain in right knee: Secondary | ICD-10-CM | POA: Diagnosis not present

## 2023-06-07 DIAGNOSIS — M6281 Muscle weakness (generalized): Secondary | ICD-10-CM | POA: Diagnosis not present

## 2023-06-07 DIAGNOSIS — M545 Low back pain, unspecified: Secondary | ICD-10-CM | POA: Diagnosis not present

## 2023-06-07 DIAGNOSIS — M25562 Pain in left knee: Secondary | ICD-10-CM | POA: Diagnosis not present

## 2023-06-07 DIAGNOSIS — M25561 Pain in right knee: Secondary | ICD-10-CM | POA: Diagnosis not present

## 2023-06-10 DIAGNOSIS — M25562 Pain in left knee: Secondary | ICD-10-CM | POA: Diagnosis not present

## 2023-06-10 DIAGNOSIS — M25561 Pain in right knee: Secondary | ICD-10-CM | POA: Diagnosis not present

## 2023-06-10 DIAGNOSIS — M545 Low back pain, unspecified: Secondary | ICD-10-CM | POA: Diagnosis not present

## 2023-06-10 DIAGNOSIS — M6281 Muscle weakness (generalized): Secondary | ICD-10-CM | POA: Diagnosis not present

## 2023-06-12 DIAGNOSIS — M25561 Pain in right knee: Secondary | ICD-10-CM | POA: Diagnosis not present

## 2023-06-12 DIAGNOSIS — M545 Low back pain, unspecified: Secondary | ICD-10-CM | POA: Diagnosis not present

## 2023-06-12 DIAGNOSIS — M6281 Muscle weakness (generalized): Secondary | ICD-10-CM | POA: Diagnosis not present

## 2023-06-12 DIAGNOSIS — M25562 Pain in left knee: Secondary | ICD-10-CM | POA: Diagnosis not present

## 2023-06-17 ENCOUNTER — Ambulatory Visit: Admitting: Podiatry

## 2023-06-18 DIAGNOSIS — M545 Low back pain, unspecified: Secondary | ICD-10-CM | POA: Diagnosis not present

## 2023-06-18 DIAGNOSIS — M25561 Pain in right knee: Secondary | ICD-10-CM | POA: Diagnosis not present

## 2023-06-18 DIAGNOSIS — M6281 Muscle weakness (generalized): Secondary | ICD-10-CM | POA: Diagnosis not present

## 2023-06-18 DIAGNOSIS — M25562 Pain in left knee: Secondary | ICD-10-CM | POA: Diagnosis not present

## 2023-06-19 DIAGNOSIS — M6281 Muscle weakness (generalized): Secondary | ICD-10-CM | POA: Diagnosis not present

## 2023-06-19 DIAGNOSIS — M25561 Pain in right knee: Secondary | ICD-10-CM | POA: Diagnosis not present

## 2023-06-19 DIAGNOSIS — M545 Low back pain, unspecified: Secondary | ICD-10-CM | POA: Diagnosis not present

## 2023-06-19 DIAGNOSIS — M25562 Pain in left knee: Secondary | ICD-10-CM | POA: Diagnosis not present

## 2023-06-21 ENCOUNTER — Ambulatory Visit: Admitting: Podiatry

## 2023-06-21 ENCOUNTER — Encounter: Payer: Self-pay | Admitting: Podiatry

## 2023-06-21 VITALS — Ht 65.0 in | Wt 172.0 lb

## 2023-06-21 DIAGNOSIS — M722 Plantar fascial fibromatosis: Secondary | ICD-10-CM | POA: Diagnosis not present

## 2023-06-21 MED ORDER — TRIAMCINOLONE ACETONIDE 10 MG/ML IJ SUSP
10.0000 mg | Freq: Once | INTRAMUSCULAR | Status: AC
  Administered 2023-06-21: 10 mg via INTRA_ARTICULAR

## 2023-06-21 NOTE — Progress Notes (Signed)
 Subjective:   Patient ID: Joyce Herrera, female   DOB: 63 y.o.   MRN: [id number]   HPI Patient presents with pain in the heels of both feet states that they are very sore and making it hard to walk   ROS      Objective:  Physical Exam  Neurovascular status intact exquisite discomfort in the heel region bilateral     Assessment:  Acute plantar fasciitis bilateral inflammation fluid medial band     Plan:  Reviewed and today sterile prep injected the plantar fascia at insertion 3 mg Kenalog  5 mg and applied sterile dressing bilateral

## 2023-06-25 DIAGNOSIS — M792 Neuralgia and neuritis, unspecified: Secondary | ICD-10-CM | POA: Diagnosis not present

## 2023-07-02 DIAGNOSIS — M6281 Muscle weakness (generalized): Secondary | ICD-10-CM | POA: Diagnosis not present

## 2023-07-02 DIAGNOSIS — M545 Low back pain, unspecified: Secondary | ICD-10-CM | POA: Diagnosis not present

## 2023-07-02 DIAGNOSIS — M25561 Pain in right knee: Secondary | ICD-10-CM | POA: Diagnosis not present

## 2023-07-02 DIAGNOSIS — M25562 Pain in left knee: Secondary | ICD-10-CM | POA: Diagnosis not present

## 2023-07-03 DIAGNOSIS — M170 Bilateral primary osteoarthritis of knee: Secondary | ICD-10-CM | POA: Diagnosis not present

## 2023-07-03 DIAGNOSIS — M1712 Unilateral primary osteoarthritis, left knee: Secondary | ICD-10-CM | POA: Diagnosis not present

## 2023-07-03 DIAGNOSIS — M1711 Unilateral primary osteoarthritis, right knee: Secondary | ICD-10-CM | POA: Diagnosis not present

## 2023-07-05 DIAGNOSIS — M6281 Muscle weakness (generalized): Secondary | ICD-10-CM | POA: Diagnosis not present

## 2023-07-05 DIAGNOSIS — M25561 Pain in right knee: Secondary | ICD-10-CM | POA: Diagnosis not present

## 2023-07-05 DIAGNOSIS — M25562 Pain in left knee: Secondary | ICD-10-CM | POA: Diagnosis not present

## 2023-07-05 DIAGNOSIS — M545 Low back pain, unspecified: Secondary | ICD-10-CM | POA: Diagnosis not present

## 2023-07-08 DIAGNOSIS — M25562 Pain in left knee: Secondary | ICD-10-CM | POA: Diagnosis not present

## 2023-07-08 DIAGNOSIS — M545 Low back pain, unspecified: Secondary | ICD-10-CM | POA: Diagnosis not present

## 2023-07-08 DIAGNOSIS — M6281 Muscle weakness (generalized): Secondary | ICD-10-CM | POA: Diagnosis not present

## 2023-07-08 DIAGNOSIS — M25561 Pain in right knee: Secondary | ICD-10-CM | POA: Diagnosis not present

## 2023-07-11 DIAGNOSIS — E6609 Other obesity due to excess calories: Secondary | ICD-10-CM | POA: Diagnosis not present

## 2023-07-11 DIAGNOSIS — E782 Mixed hyperlipidemia: Secondary | ICD-10-CM | POA: Diagnosis not present

## 2023-07-11 DIAGNOSIS — F329 Major depressive disorder, single episode, unspecified: Secondary | ICD-10-CM | POA: Diagnosis not present

## 2023-07-11 DIAGNOSIS — Z6831 Body mass index (BMI) 31.0-31.9, adult: Secondary | ICD-10-CM | POA: Diagnosis not present

## 2023-07-11 DIAGNOSIS — Z1331 Encounter for screening for depression: Secondary | ICD-10-CM | POA: Diagnosis not present

## 2023-07-11 DIAGNOSIS — Z0001 Encounter for general adult medical examination with abnormal findings: Secondary | ICD-10-CM | POA: Diagnosis not present

## 2023-07-11 DIAGNOSIS — J3089 Other allergic rhinitis: Secondary | ICD-10-CM | POA: Diagnosis not present

## 2023-07-11 DIAGNOSIS — K219 Gastro-esophageal reflux disease without esophagitis: Secondary | ICD-10-CM | POA: Diagnosis not present

## 2023-07-11 DIAGNOSIS — E063 Autoimmune thyroiditis: Secondary | ICD-10-CM | POA: Diagnosis not present

## 2023-07-11 DIAGNOSIS — I10 Essential (primary) hypertension: Secondary | ICD-10-CM | POA: Diagnosis not present

## 2023-07-11 DIAGNOSIS — R300 Dysuria: Secondary | ICD-10-CM | POA: Diagnosis not present

## 2023-07-22 ENCOUNTER — Ambulatory Visit
Admission: RE | Admit: 2023-07-22 | Discharge: 2023-07-22 | Disposition: A | Discharge status: Home / Self Care | Source: Ambulatory Visit | Attending: Family Medicine | Admitting: Family Medicine

## 2023-07-22 VITALS — BP 129/95 | HR 90 | Temp 98.6°F | Resp 14

## 2023-07-22 DIAGNOSIS — M25561 Pain in right knee: Secondary | ICD-10-CM | POA: Diagnosis not present

## 2023-07-22 DIAGNOSIS — J029 Acute pharyngitis, unspecified: Secondary | ICD-10-CM | POA: Insufficient documentation

## 2023-07-22 DIAGNOSIS — M6281 Muscle weakness (generalized): Secondary | ICD-10-CM | POA: Diagnosis not present

## 2023-07-22 DIAGNOSIS — M25562 Pain in left knee: Secondary | ICD-10-CM | POA: Diagnosis not present

## 2023-07-22 DIAGNOSIS — M545 Low back pain, unspecified: Secondary | ICD-10-CM | POA: Diagnosis not present

## 2023-07-22 LAB — POCT RAPID STREP A (OFFICE): Rapid Strep A Screen: NEGATIVE

## 2023-07-22 NOTE — Discharge Instructions (Signed)
 Rapid strep was negative, throat culture is pending. We will let you know if this comes back positive. Continue your allergy regimen, warm compresses, OTC pain medications as needed. Follow up with Primary Care if not resolving

## 2023-07-22 NOTE — ED Triage Notes (Signed)
 Pt reports cough,congestion, sore throat, swelling in the neck on the right side, from the pallet to the goozle hurts hoarseness and nausea.

## 2023-07-23 NOTE — ED Provider Notes (Signed)
 [facility]    CSN: [id number] Arrival date & time: 07/22/23  1739      History   Chief Complaint Chief Complaint  Patient presents with   Sore Throat    Hard to swoller,  lump on side on neck, neck feels swollen, hoarness - Entered by patient    HPI Joyce Herrera is a 63 y.o. female.   Patient presenting today with slight cough, congestion as well as ongoing sore throat, hoarseness and left-sided neck swelling.  She denies fever, chills, chest pain, shortness of breath, abdominal pain, vomiting, diarrhea.  She is concerned about a parathyroid gland issue as she feels a lump in the right side of her neck when she presses hard on the area.  So far not trying anything over-the-counter for symptoms.    Past Medical History:  Diagnosis Date   Anxiety    Arthritis    Asthma    Depression    GERD (gastroesophageal reflux disease)    Headache(784.0)    History of colon polyps    Hyperthyroidism    Palpitations    Plantar fasciitis    Pneumonia    PONV (postoperative nausea and vomiting)    Seasonal allergies     Patient Active Problem List   Diagnosis Date Noted   OA (osteoarthritis) of knee 02/01/2021   S/P total knee arthroplasty, left 02/01/2021   Grade III hemorrhoids 12/13/2020   Esophageal dysphagia 05/21/2017   Gastroesophageal reflux disease 04/23/2017   Vaginal irritation 07/19/2016   Urinary frequency 07/19/2016   Pelvic relaxation due to cystocele, midline 07/19/2016   Atrophic vaginitis 07/19/2016   Seasonal allergic rhinitis due to pollen 07/17/2016   Moderate persistent asthma, uncomplicated 07/17/2016   Drug allergy 07/17/2016   Chronic nonintractable headache 07/17/2016   Allergic contact dermatitis due to metals 07/17/2016   Class 1 obesity due to excess calories without serious comorbidity with body mass index (BMI) of 30.0 to 30.9 in adult 03/14/2016   Family hx of colon cancer 09/16/2015   History of colonic polyps 09/16/2015    Cough 09/15/2015   Laryngopharyngeal reflux (LPR) 09/15/2015   Subclinical hyperthyroidism 08/24/2015   Hyperlipidemia 08/24/2015    Past Surgical History:  Procedure Laterality Date   ABDOMINAL HYSTERECTOMY     BIOPSY  10/23/2017   Procedure: BIOPSY;  Surgeon: Golda Claudis PENNER, MD;  [facility];  Service: Endoscopy;;  gastric   CHOLECYSTECTOMY     COLONOSCOPY  09/14/2010   Procedure: COLONOSCOPY;  Surgeon: Claudis PENNER Golda, MD;  [facility];  Service: Endoscopy;  Laterality: N/A;   COLONOSCOPY N/A 01/11/2016   Procedure: COLONOSCOPY;  Surgeon: Claudis PENNER Golda, MD;  [facility];  Service: Endoscopy;  Laterality: N/A;  930   COLONOSCOPY WITH PROPOFOL  N/A 05/18/2021   Procedure: COLONOSCOPY WITH PROPOFOL ;  Surgeon: Golda Claudis PENNER, MD;  [facility];  Service: Endoscopy;  Laterality: N/A;  730   CONVERSION TO TOTAL KNEE Left 02/01/2021   Procedure: Revision left knee unicompartmental arthroplasty to total knee arthroplasty;  Surgeon: Melodi Lerner, MD;  [facility]: [facility];  Service: Orthopedics;  Laterality: Left;   ESOPHAGEAL DILATION N/A 10/23/2017   Procedure: ESOPHAGEAL DILATION;  Surgeon: Golda Claudis PENNER, MD;  [facility];  Service: Endoscopy;  Laterality: N/A;   ESOPHAGOGASTRODUODENOSCOPY N/A 10/23/2017   Procedure: ESOPHAGOGASTRODUODENOSCOPY (EGD);  Surgeon: Golda Claudis PENNER, MD;  [facility];  Service: Endoscopy;  Laterality: N/A;  11:25-rescheduled to 9/25 @  8:30am per Jenkins   MEDIAL PARTIAL KNEE REPLACEMENT Left    NASAL SEPTUM SURGERY     PLANTAR FASCIECTOMY     bilateral   POLYPECTOMY  01/11/2016   Procedure: POLYPECTOMY;  Surgeon: Claudis RAYMOND Rivet, MD;  [facility];  Service: Endoscopy;;  multiple colon polyps    POLYPECTOMY  05/18/2021   Procedure: POLYPECTOMY INTESTINAL;  Surgeon: Rivet Claudis RAYMOND, MD;  [facility];  Service: Endoscopy;;   TONSILLECTOMY      OB History     Gravida   1   Para  1   Term      Preterm      AB      Living  1      SAB      IAB      Ectopic      Multiple      Live Births               Home Medications    Prior to Admission medications   Medication Sig Start Date End Date Taking? Authorizing Provider  atorvastatin  (LIPITOR) 10 MG tablet Take 10 mg by mouth at bedtime.    [provider]  CALCIUM  CARBONATE-VITAMIN D PO Take 1,200 tablets by mouth at bedtime.    [provider]  Epinastine HCl 0.05 % ophthalmic solution Place 1 drop into both eyes 2 (two) times daily as needed (allergies).    [provider]  esomeprazole  (NEXIUM ) 40 MG capsule Take 40 mg by mouth daily as needed (acid reflux).    [provider]  fluticasone  (FLONASE ) 50 MCG/ACT nasal spray Place 2 sprays into both nostrils daily. 10/03/22   Leath-Warren, Etta PARAS, NP  levocetirizine (XYZAL ) 5 MG tablet TAKE (1) TABLET BY MOUTH TWICE DAILY. Patient taking differently: Take 5 mg by mouth every evening. 05/09/20   Iva Marty Saltness, MD  montelukast  (SINGULAIR ) 10 MG tablet TAKE (1) TABLET BY MOUTH ONCE DAILY. Patient taking differently: Take 10 mg by mouth at bedtime. 05/09/20   Iva Marty Saltness, MD  pregabalin  (LYRICA ) 300 MG capsule Take 300 mg by mouth at bedtime.    [provider]  propranolol  (INDERAL ) 20 MG tablet Take 20 mg by mouth at bedtime.    [provider]  zolpidem  (AMBIEN ) 10 MG tablet Take 5 mg by mouth at bedtime. 07/11/16   [provider]    Family History Family History  Problem Relation Age of Onset   Lung cancer Mother    Kidney cancer Mother    Colon cancer Father    Prostate cancer Father    Diabetes Father    Hypertension Father    Kidney failure Father     Social History Social History   Tobacco Use   Smoking status: Former   Smokeless tobacco: Never   Tobacco comments:    quit October 2004  Vaping Use   Vaping status: Never Used  Substance  Use Topics   Alcohol use: Not Currently    Comment: rare   Drug use: No     Allergies   Cephalexin, Nickel, and Penicillins   Review of Systems Review of Systems PER HPI  Physical Exam Triage Vital Signs ED Triage Vitals  Encounter Vitals Group     BP 07/22/23 1813 (!) 129/95     Girls Systolic BP Percentile --      Girls Diastolic BP Percentile --      Boys Systolic BP Percentile --  Boys Diastolic BP Percentile --      Pulse Rate 07/22/23 1813 90     Resp 07/22/23 1813 14     Temp 07/22/23 1813 98.6 F (37 C)     Temp Source 07/22/23 1813 Oral     SpO2 07/22/23 1813 93 %     Weight --      Height --      Head Circumference --      Peak Flow --      Pain Score 07/22/23 1818 0     Pain Loc --      Pain Education --      Exclude from Growth Chart --    No data found.  Updated Vital Signs BP (!) 129/95 (BP Location: Right Arm)   Pulse 90   Temp 98.6 F (37 C) (Oral)   Resp 14   LMP 01/30/1992   SpO2 93%   Visual Acuity Right Eye Distance:   Left Eye Distance:   Bilateral Distance:    Right Eye Near:   Left Eye Near:    Bilateral Near:     Physical Exam Vitals and nursing note reviewed.  Constitutional:      Appearance: Normal appearance. She is not ill-appearing.  HENT:     Head: Atraumatic.     Nose: Nose normal.     Mouth/Throat:     Mouth: Mucous membranes are moist.     Pharynx: Oropharynx is clear. No oropharyngeal exudate or posterior oropharyngeal erythema.   Eyes:     Conjunctiva/sclera: Conjunctivae normal.    Cardiovascular:     Rate and Rhythm: Normal rate and regular rhythm.  Pulmonary:     Effort: Pulmonary effort is normal.     Breath sounds: Normal breath sounds.   Musculoskeletal:        General: Normal range of motion.     Cervical back: Normal range of motion and neck supple.  Lymphadenopathy:     Cervical: No cervical adenopathy.   Skin:    General: Skin is warm and dry.   Neurological:     Mental Status:  She is alert and oriented to person, place, and time.     Motor: No weakness.     Gait: Gait normal.   Psychiatric:        Mood and Affect: Mood normal.        Thought Content: Thought content normal.        Judgment: Judgment normal.      UC Treatments / Results  Labs (all labs ordered are listed, but only abnormal results are displayed) Labs Reviewed  CULTURE, GROUP A STREP [facility])  POCT RAPID STREP A (OFFICE)    EKG   Radiology No results found.  Procedures Procedures (including critical care time)  Medications Ordered in UC Medications - No data to display  Initial Impression / Assessment and Plan / UC Course  I have reviewed the triage vital signs and the nursing notes.  Pertinent labs & imaging results that were available during my care of the patient were reviewed by me and considered in my medical decision making (see chart for details).     Well-appearing with stable vital signs, in no acute distress.  No appreciable abnormality on exam today.  Rapid strep negative, throat culture pending for further rule out.  Reassurance given, the lump that she is feeling on the palpation of her own neck is consistent with typical lymph node that does not feel particularly enlarged.  Discussed continued allergy regimen, nasal sprays, salt water  gargles, follow-up with PCP.  Return sooner for worsening symptoms. Final Clinical Impressions(s) / UC Diagnoses   Final diagnoses:  Sore throat     Discharge Instructions      Rapid strep was negative, throat culture is pending. We will let you know if this comes back positive. Continue your allergy regimen, warm compresses, OTC pain medications as needed. Follow up with Primary Care if not resolving    ED Prescriptions   None    PDMP not reviewed this encounter.   Stuart Vernell Norris, PA-C 07/23/23 1546

## 2023-07-24 ENCOUNTER — Ambulatory Visit: Admitting: Podiatry

## 2023-07-24 DIAGNOSIS — M25562 Pain in left knee: Secondary | ICD-10-CM | POA: Diagnosis not present

## 2023-07-24 DIAGNOSIS — M545 Low back pain, unspecified: Secondary | ICD-10-CM | POA: Diagnosis not present

## 2023-07-24 DIAGNOSIS — M25561 Pain in right knee: Secondary | ICD-10-CM | POA: Diagnosis not present

## 2023-07-24 DIAGNOSIS — M6281 Muscle weakness (generalized): Secondary | ICD-10-CM | POA: Diagnosis not present

## 2023-07-25 ENCOUNTER — Ambulatory Visit: Admitting: Podiatry

## 2023-07-25 ENCOUNTER — Encounter: Payer: Self-pay | Admitting: Podiatry

## 2023-07-25 ENCOUNTER — Ambulatory Visit (INDEPENDENT_AMBULATORY_CARE_PROVIDER_SITE_OTHER)

## 2023-07-25 DIAGNOSIS — M7989 Other specified soft tissue disorders: Secondary | ICD-10-CM

## 2023-07-25 LAB — CULTURE, GROUP A STREP (THRC)

## 2023-07-26 DIAGNOSIS — Z0001 Encounter for general adult medical examination with abnormal findings: Secondary | ICD-10-CM | POA: Diagnosis not present

## 2023-07-26 DIAGNOSIS — E559 Vitamin D deficiency, unspecified: Secondary | ICD-10-CM | POA: Diagnosis not present

## 2023-07-26 DIAGNOSIS — E063 Autoimmune thyroiditis: Secondary | ICD-10-CM | POA: Diagnosis not present

## 2023-07-26 NOTE — Progress Notes (Addendum)
 Subjective:   Patient ID: Joyce Herrera, female   DOB: 63 y.o.   MRN: [id number]   HPI Patient presents concerned about discoloration on the bottom of her feet right over left with swelling and has tried lotion and does get quite a bit of irritation without history of injury   ROS      Objective:  Physical Exam  Neurovascular status was found to be intact and noted on the plantar aspect of both feet there is slight redness no erythema no edema no drainage was noted no systemic signs of infection no portal of entry no skin that open.  It is localized to this area and there is no current burning or pain associated with it     Assessment:  Possibility that she may have had a heat injury and not realized it or that there may be some reaction to summer heat     Plan:  H&P reviewed there is nothing dangerous about this and I do not see anything besides lotion to use.  It should evolve I did advise on paraffin wax and if any changes were to occur negative to come back  Precautionary x-rays taken due to the inflamed tissue and her issues in the past were negative for signs that there is any osteolysis or indications of bone structural pathology

## 2023-07-29 DIAGNOSIS — M6281 Muscle weakness (generalized): Secondary | ICD-10-CM | POA: Diagnosis not present

## 2023-07-29 DIAGNOSIS — M25561 Pain in right knee: Secondary | ICD-10-CM | POA: Diagnosis not present

## 2023-07-29 DIAGNOSIS — M25562 Pain in left knee: Secondary | ICD-10-CM | POA: Diagnosis not present

## 2023-07-29 DIAGNOSIS — M545 Low back pain, unspecified: Secondary | ICD-10-CM | POA: Diagnosis not present

## 2023-07-31 DIAGNOSIS — M25562 Pain in left knee: Secondary | ICD-10-CM | POA: Diagnosis not present

## 2023-07-31 DIAGNOSIS — M25561 Pain in right knee: Secondary | ICD-10-CM | POA: Diagnosis not present

## 2023-07-31 DIAGNOSIS — M6281 Muscle weakness (generalized): Secondary | ICD-10-CM | POA: Diagnosis not present

## 2023-07-31 DIAGNOSIS — M545 Low back pain, unspecified: Secondary | ICD-10-CM | POA: Diagnosis not present

## 2023-08-23 DIAGNOSIS — Z6830 Body mass index (BMI) 30.0-30.9, adult: Secondary | ICD-10-CM | POA: Diagnosis not present

## 2023-08-23 DIAGNOSIS — E6609 Other obesity due to excess calories: Secondary | ICD-10-CM | POA: Diagnosis not present

## 2023-08-24 DIAGNOSIS — M25562 Pain in left knee: Secondary | ICD-10-CM | POA: Diagnosis not present

## 2023-08-24 DIAGNOSIS — Z96652 Presence of left artificial knee joint: Secondary | ICD-10-CM | POA: Diagnosis not present

## 2023-09-25 DIAGNOSIS — M25562 Pain in left knee: Secondary | ICD-10-CM | POA: Diagnosis not present

## 2023-09-25 DIAGNOSIS — G8929 Other chronic pain: Secondary | ICD-10-CM | POA: Diagnosis not present

## 2023-09-25 DIAGNOSIS — Z96652 Presence of left artificial knee joint: Secondary | ICD-10-CM | POA: Diagnosis not present

## 2023-10-24 DIAGNOSIS — D225 Melanocytic nevi of trunk: Secondary | ICD-10-CM | POA: Diagnosis not present

## 2023-10-24 DIAGNOSIS — Z1283 Encounter for screening for malignant neoplasm of skin: Secondary | ICD-10-CM | POA: Diagnosis not present

## 2023-10-24 DIAGNOSIS — L820 Inflamed seborrheic keratosis: Secondary | ICD-10-CM | POA: Diagnosis not present

## 2023-10-30 ENCOUNTER — Other Ambulatory Visit (HOSPITAL_COMMUNITY): Payer: Self-pay | Admitting: Family Medicine

## 2023-10-30 DIAGNOSIS — Z1231 Encounter for screening mammogram for malignant neoplasm of breast: Secondary | ICD-10-CM

## 2023-11-11 ENCOUNTER — Ambulatory Visit (HOSPITAL_COMMUNITY)
Admission: RE | Admit: 2023-11-11 | Discharge: 2023-11-11 | Disposition: A | Discharge status: Home / Self Care | Source: Ambulatory Visit | Attending: Family Medicine | Admitting: Family Medicine

## 2023-11-11 DIAGNOSIS — Z1231 Encounter for screening mammogram for malignant neoplasm of breast: Secondary | ICD-10-CM | POA: Insufficient documentation

## 2023-11-14 DIAGNOSIS — M1712 Unilateral primary osteoarthritis, left knee: Secondary | ICD-10-CM | POA: Diagnosis not present

## 2023-11-14 DIAGNOSIS — M25561 Pain in right knee: Secondary | ICD-10-CM | POA: Diagnosis not present

## 2023-11-14 DIAGNOSIS — M170 Bilateral primary osteoarthritis of knee: Secondary | ICD-10-CM | POA: Diagnosis not present

## 2023-11-14 DIAGNOSIS — M1711 Unilateral primary osteoarthritis, right knee: Secondary | ICD-10-CM | POA: Diagnosis not present

## 2023-11-19 ENCOUNTER — Telehealth: Payer: Self-pay | Admitting: Podiatry

## 2023-11-19 NOTE — Telephone Encounter (Signed)
 The patient has called multiple times over the past several days at different times and has left voicemail messages but has not received a return call. She would like to discuss a recurring bill related to the date of service on 07/25/2023, which she is currently reviewing. The bill now indicates it is about to be sent to collections.  Please return the patient's call at your earliest convenience to discuss this matter further.  Thank you.

## 2023-12-03 DIAGNOSIS — K2100 Gastro-esophageal reflux disease with esophagitis, without bleeding: Secondary | ICD-10-CM | POA: Diagnosis not present

## 2023-12-03 DIAGNOSIS — L230 Allergic contact dermatitis due to metals: Secondary | ICD-10-CM | POA: Diagnosis not present

## 2023-12-03 DIAGNOSIS — E66811 Obesity, class 1: Secondary | ICD-10-CM | POA: Diagnosis not present

## 2023-12-03 DIAGNOSIS — J301 Allergic rhinitis due to pollen: Secondary | ICD-10-CM | POA: Diagnosis not present

## 2023-12-03 DIAGNOSIS — E0590 Thyrotoxicosis, unspecified without thyrotoxic crisis or storm: Secondary | ICD-10-CM | POA: Diagnosis not present

## 2023-12-03 DIAGNOSIS — R053 Chronic cough: Secondary | ICD-10-CM | POA: Diagnosis not present

## 2023-12-12 ENCOUNTER — Encounter (INDEPENDENT_AMBULATORY_CARE_PROVIDER_SITE_OTHER): Payer: Self-pay | Admitting: *Deleted

## 2023-12-17 DIAGNOSIS — M792 Neuralgia and neuritis, unspecified: Secondary | ICD-10-CM | POA: Diagnosis not present

## 2023-12-17 DIAGNOSIS — M7918 Myalgia, other site: Secondary | ICD-10-CM | POA: Diagnosis not present

## 2023-12-20 DIAGNOSIS — Z96652 Presence of left artificial knee joint: Secondary | ICD-10-CM | POA: Diagnosis not present

## 2023-12-20 DIAGNOSIS — M25562 Pain in left knee: Secondary | ICD-10-CM | POA: Diagnosis not present

## 2023-12-30 ENCOUNTER — Other Ambulatory Visit (HOSPITAL_COMMUNITY): Payer: Self-pay | Admitting: Otolaryngology

## 2023-12-30 DIAGNOSIS — Z96652 Presence of left artificial knee joint: Secondary | ICD-10-CM

## 2023-12-31 ENCOUNTER — Ambulatory Visit (INDEPENDENT_AMBULATORY_CARE_PROVIDER_SITE_OTHER): Admitting: Gastroenterology

## 2023-12-31 ENCOUNTER — Encounter (INDEPENDENT_AMBULATORY_CARE_PROVIDER_SITE_OTHER): Payer: Self-pay | Admitting: Gastroenterology

## 2023-12-31 VITALS — BP 110/75 | HR 99 | Temp 97.7°F | Ht 65.0 in | Wt 179.3 lb

## 2023-12-31 DIAGNOSIS — Z800 Family history of malignant neoplasm of digestive organs: Secondary | ICD-10-CM

## 2023-12-31 DIAGNOSIS — K641 Second degree hemorrhoids: Secondary | ICD-10-CM | POA: Insufficient documentation

## 2023-12-31 DIAGNOSIS — K219 Gastro-esophageal reflux disease without esophagitis: Secondary | ICD-10-CM | POA: Diagnosis not present

## 2023-12-31 DIAGNOSIS — R490 Dysphonia: Secondary | ICD-10-CM | POA: Diagnosis not present

## 2023-12-31 DIAGNOSIS — Z860100 Personal history of colon polyps, unspecified: Secondary | ICD-10-CM

## 2023-12-31 DIAGNOSIS — K5900 Constipation, unspecified: Secondary | ICD-10-CM | POA: Diagnosis not present

## 2023-12-31 DIAGNOSIS — K5904 Chronic idiopathic constipation: Secondary | ICD-10-CM | POA: Insufficient documentation

## 2023-12-31 DIAGNOSIS — K921 Melena: Secondary | ICD-10-CM | POA: Insufficient documentation

## 2023-12-31 DIAGNOSIS — K625 Hemorrhage of anus and rectum: Secondary | ICD-10-CM

## 2023-12-31 MED ORDER — OMEPRAZOLE 40 MG PO CPDR: 40.0000 mg | DELAYED_RELEASE_CAPSULE | Freq: Every day | ORAL | 3 refills | Status: AC

## 2023-12-31 MED ORDER — HYDROCORTISONE ACETATE 25 MG RE SUPP: 25.0000 mg | Freq: Two times a day (BID) | RECTAL | 0 refills | Status: DC

## 2023-12-31 MED ORDER — POLYETHYLENE GLYCOL 3350 17 G PO PACK: 17.0000 g | PACK | Freq: Two times a day (BID) | ORAL | 0 refills | Status: AC

## 2023-12-31 MED ORDER — PSYLLIUM 58.6 % PO PACK: 1.0000 | PACK | Freq: Two times a day (BID) | ORAL | 2 refills | Status: AC

## 2023-12-31 NOTE — Patient Instructions (Signed)
 It was very nice to meet you today, as dicussed with will plan for the following :  1) I recommend taking Omeprazole 40mg  , 30 min before breakfast   2)Ensure adequate fluid intake: Aim for 8 glasses of water  daily. Follow a high fiber diet: Include foods such as dates, prunes, pears, and kiwi. Take Miralax  twice a day for the first week, then reduce to once daily thereafter. Use Metamucil twice a day.  3) Anusol suppository for 7 days

## 2023-12-31 NOTE — Progress Notes (Signed)
 Joyce Herrera , M.D. Gastroenterology & Hepatology [facility] [address] [place], [place] [address] Primary Care Physician: Joyce Herrera [address] [place] [address]  Chief Complaint:   hoarseness, cough, and is hematochezia and hemorrhoids  History of Present Illness: Joyce Herrera is a 63 y.o. female with GERD, family history of colon cancer who presents for evaluation of hoarseness, cough, and is hematochezia and hemorrhoids  Patient reports that previously she had dysphagia but after her last dilation in 2019 that improved.  She currently is having hoarseness and episodes of coughing.  Patient was placed on Prilosec for 14 days which seemed to significantly improve her symptoms but was later changed to Nexium   Patient reports having a bowel movement every 2 to 4 days with much straining.  Is able to appreciate hemorrhoids and occasional blood per rectum upon wiping  The patient denies having any nausea, vomiting, fever, chills, = melena, hematemesis, abdominal distention, abdominal pain, diarrhea, jaundice, pruritus or weight loss.  Last EGD:09/2027  - Z- line irregular, 36 cm from the incisors. - No endoscopic esophageal abnormality to explain patient' s dysphagia. Esophagus dilated. - Gastritis. Biopsied. - Normal duodenal bulb and second portion of the duodenum.  Last Colonoscopy:2023 - Perianal skin tags found on perianal exam. - One small polyp in the cecum. Biopsied. - One 5 mm polyp in the proximal transverse colon, removed with a cold snare. Resected and retrieved. - Diverticulosis in the sigmoid colon. - External and internal hemorrhoids.   Gastric biopsy negative for H. pylori infection.   A.   COLON, TRANSVERSE, CECAL, POLYPECTOMY:  - Tubular adenoma (2) without high grade dysplasia.   Repeat 5 years   FHx: Family history is positive for colon carcinoma in her father who was 77 at the time of  diagnosis and died of unrelated causes several years later.   Past Medical History: Past Medical History:  Diagnosis Date   Anxiety    Arthritis    Asthma    Depression    GERD (gastroesophageal reflux disease)    Headache(784.0)    History of colon polyps    Hyperthyroidism    Palpitations    Plantar fasciitis    Pneumonia    PONV (postoperative nausea and vomiting)    Seasonal allergies     Past Surgical History: Past Surgical History:  Procedure Laterality Date   ABDOMINAL HYSTERECTOMY     BIOPSY  10/23/2017   Procedure: BIOPSY;  Surgeon: Golda Claudis PENNER, MD;  [facility];  Service: Endoscopy;;  gastric   CHOLECYSTECTOMY     COLONOSCOPY  09/14/2010   Procedure: COLONOSCOPY;  Surgeon: Claudis PENNER Golda, MD;  [facility];  Service: Endoscopy;  Laterality: N/A;   COLONOSCOPY N/A 01/11/2016   Procedure: COLONOSCOPY;  Surgeon: Claudis PENNER Golda, MD;  [facility];  Service: Endoscopy;  Laterality: N/A;  930   COLONOSCOPY WITH PROPOFOL  N/A 05/18/2021   Procedure: COLONOSCOPY WITH PROPOFOL ;  Surgeon: Golda Claudis PENNER, MD;  [facility];  Service: Endoscopy;  Laterality: N/A;  730   CONVERSION TO TOTAL KNEE Left 02/01/2021   Procedure: Revision left knee unicompartmental arthroplasty to total knee arthroplasty;  Surgeon: Melodi Lerner, MD;  Location: [facility];  Service: Orthopedics;  Laterality: Left;   ESOPHAGEAL DILATION N/A 10/23/2017   Procedure: ESOPHAGEAL DILATION;  Surgeon: Golda Claudis PENNER, MD;  [facility];  Service: Endoscopy;  Laterality: N/A;   ESOPHAGOGASTRODUODENOSCOPY N/A 10/23/2017   Procedure: ESOPHAGOGASTRODUODENOSCOPY (EGD);  Surgeon: Golda Claudis PENNER, MD;  [facility];  Service: Endoscopy;  Laterality: N/A;  11:25-rescheduled to 9/25 @ 8:30am per Jenkins   MEDIAL PARTIAL KNEE REPLACEMENT Left    NASAL SEPTUM SURGERY     PLANTAR FASCIECTOMY     bilateral   POLYPECTOMY  01/11/2016   Procedure: POLYPECTOMY;   Surgeon: Claudis PENNER Golda, MD;  [facility];  Service: Endoscopy;;  multiple colon polyps    POLYPECTOMY  05/18/2021   Procedure: POLYPECTOMY INTESTINAL;  Surgeon: Golda Claudis PENNER, MD;  [facility];  Service: Endoscopy;;   TONSILLECTOMY      Family History: Family History  Problem Relation Age of Onset   Lung cancer Mother    Kidney cancer Mother    Colon cancer Father    Prostate cancer Father    Diabetes Father    Hypertension Father    Kidney failure Father     Social History: Social History   Tobacco Use  Smoking Status Former  Smokeless Tobacco Never  Tobacco Comments   quit October 2004   Social History   Substance and Sexual Activity  Alcohol Use Not Currently   Comment: rare   Social History   Substance and Sexual Activity  Drug Use No    Allergies: Allergies  Allergen Reactions   Cephalexin Other (See Comments)    Pt stated, Ears started to itch; hands and feet were itching   Nickel Rash   Penicillins Rash    Has patient had a PCN reaction causing immediate rash, facial/tongue/throat swelling, SOB or lightheadedness with hypotension: No Has patient had a PCN reaction causing severe rash involving mucus membranes or skin necrosis: No Has patient had a PCN reaction that required hospitalization: No Has patient had a PCN reaction occurring within the last 10 years: No If all of the above answers are NO, then may proceed with Cephalosporin use.     Medications: Current Outpatient Medications  Medication Sig Dispense Refill   atorvastatin  (LIPITOR) 10 MG tablet Take 10 mg by mouth at bedtime.     CALCIUM  CARBONATE-VITAMIN D PO Take 1,200 tablets by mouth at bedtime.     Epinastine HCl 0.05 % ophthalmic solution Place 1 drop into both eyes 2 (two) times daily as needed (allergies).     esomeprazole  (NEXIUM ) 40 MG capsule Take 40 mg by mouth daily as needed (acid reflux).     levocetirizine (XYZAL ) 5 MG tablet TAKE (1) TABLET  BY MOUTH TWICE DAILY. (Patient taking differently: Take 5 mg by mouth every evening.) 60 tablet 2   pregabalin  (LYRICA ) 300 MG capsule Take 300 mg by mouth at bedtime.     propranolol  (INDERAL ) 20 MG tablet Take 20 mg by mouth at bedtime.     zolpidem  (AMBIEN ) 10 MG tablet Take 5 mg by mouth at bedtime.  1   No current facility-administered medications for this visit.    Review of Systems: GENERAL: negative for malaise, night sweats HEENT: No changes in hearing or vision, no nose bleeds or other nasal problems. NECK: Negative for lumps, goiter, pain and significant neck swelling RESPIRATORY: Negative for cough, wheezing CARDIOVASCULAR: Negative for chest pain, leg swelling, palpitations, orthopnea GI: SEE HPI MUSCULOSKELETAL: Negative for joint pain or swelling, back pain, and muscle pain. SKIN: Negative for lesions, rash HEMATOLOGY Negative for prolonged bleeding, bruising easily, and swollen nodes. ENDOCRINE: Negative for cold or heat intolerance, polyuria,  polydipsia and goiter. NEURO: negative for tremor, gait imbalance, syncope and seizures. The remainder of the review of systems is noncontributory.   Physical Exam: BP 110/75   Pulse 99   Temp 97.7 F (36.5 C)   Ht 5' 5 (1.651 m)   Wt 179 lb 4.8 oz (81.3 kg)   LMP 01/30/1992   BMI 29.84 kg/m  GENERAL: The patient is AO x3, in no acute distress. HEENT: Head is normocephalic and atraumatic. EOMI are intact. Mouth is well hydrated and without lesions. NECK: Supple. No masses LUNGS: Clear to auscultation. No presence of rhonchi/wheezing/rales. Adequate chest expansion HEART: RRR, normal s1 and s2. ABDOMEN: Soft, nontender, no guarding, no peritoneal signs, and nondistended. BS +. No masses.  Imaging/Labs: as above     Latest Ref Rng & Units 02/02/2021    3:28 AM 01/25/2021   11:08 AM  CBC  WBC 4.0 - 10.5 K/uL 11.7  7.7   Hemoglobin 12.0 - 15.0 g/dL 87.8  85.5   Hematocrit 36.0 - 46.0 % 37.3  44.7   Platelets 150 -  400 K/uL 289  272    No results found for: IRON, TIBC, FERRITIN  I personally reviewed and interpreted the available labs, imaging and endoscopic files.  Impression and Plan: MONQUE HAGGAR is a 63 y.o. female with GERD, family history of colon cancer who presents for evaluation of hoarseness, cough, and is hematochezia and hemorrhoids  #Horeseness/Cough   Patient has atypical symptoms of GERD which are hoarseness and cough.  This seemed to improve with Prilosec  I discussed with patient indication for wireless pH monitoring while continuing on acid suppression for evaluation of atypical symptoms of GERD  She would like to continue medical management this time and reassess her symptoms  Continue omeprazole 40 mg 30 minutes before breakfast  If persistent symptoms will reassess for clinical indication of EGD with Bravo placement  #Hemorrhoids #Hematochezia # History of colon polyps # Family history of colon cancer #Constipation   Patient had a high-quality colonoscopy performed with Dr.Rehman in 2023 with 2 diminutive polyps removed and suggest repeat was 5 years.  Colonoscopy report does demonstrate hemorrhoids  Current painless hematochezia is likely hemorrhoidal bleeding in setting of constipation  He will start with getting constipation under control with Metamucil and MiraLAX   Anusol suppository for 7 days  If persistent hematochezia will reassess for early colonoscopy in future   Ensure adequate fluid intake: Aim for 8 glasses of water  daily. Follow a high fiber diet: Include foods such as dates, prunes, pears, and kiwi. Take Miralax  twice a day for the first week, then reduce to once daily thereafter. Use Metamucil twice a day.   All questions were answered.      Milton Sagona Faizan Macenzie Burford, MD Gastroenterology and Hepatology [facility]   This chart has been completed using [facility] Dictation software, and while attempts have  been made to ensure accuracy , certain words and phrases may not be transcribed as intended

## 2024-01-02 ENCOUNTER — Ambulatory Visit (HOSPITAL_COMMUNITY): Admission: RE | Admit: 2024-01-02 | Discharge: 2024-01-02 | Attending: Otolaryngology | Admitting: Otolaryngology

## 2024-01-02 DIAGNOSIS — Z96652 Presence of left artificial knee joint: Secondary | ICD-10-CM

## 2024-01-02 DIAGNOSIS — M25562 Pain in left knee: Secondary | ICD-10-CM | POA: Diagnosis not present

## 2024-01-02 MED ORDER — TECHNETIUM TC 99M MEDRONATE IV KIT
20.0000 | PACK | Freq: Once | INTRAVENOUS | Status: AC | PRN
  Administered 2024-01-02: 20 via INTRAVENOUS

## 2024-01-07 ENCOUNTER — Telehealth (INDEPENDENT_AMBULATORY_CARE_PROVIDER_SITE_OTHER): Payer: Self-pay | Admitting: Gastroenterology

## 2024-01-07 NOTE — Telephone Encounter (Signed)
 Pt left voicemail stating anusol  was not working and could provider call in something else. Returned call to patient. Pt states the anusol  is not working. Pt states hemorrhoids still pop out and when she showers at night she will push them back in. Having more than one hemorrhoid a day due to taking fiber and miralax . Does have some bleeding but tries to be gentle when wiping. Pt would like to know if something else can be called in. Please advise. Thank you!  [facility] (Pt would like a call back between 4-5pm)

## 2024-01-07 NOTE — Telephone Encounter (Signed)
 Hi Tanya  I recommend Preparation H and Anusol  suppository twice daily for next 5-7 days . If she continues to have bleeding and protrusion please call us  as we may discuss early colonoscopy to make sure the severity of hemorrhoids and if any other lesion explaining blood per rectum

## 2024-01-08 ENCOUNTER — Encounter (INDEPENDENT_AMBULATORY_CARE_PROVIDER_SITE_OTHER): Payer: Self-pay

## 2024-01-08 ENCOUNTER — Other Ambulatory Visit (INDEPENDENT_AMBULATORY_CARE_PROVIDER_SITE_OTHER): Payer: Self-pay

## 2024-01-08 DIAGNOSIS — K641 Second degree hemorrhoids: Secondary | ICD-10-CM

## 2024-01-08 DIAGNOSIS — K5904 Chronic idiopathic constipation: Secondary | ICD-10-CM

## 2024-01-08 DIAGNOSIS — K921 Melena: Secondary | ICD-10-CM

## 2024-01-08 MED ORDER — HYDROCORTISONE ACETATE 25 MG RE SUPP: 25.0000 mg | Freq: Two times a day (BID) | RECTAL | 0 refills | Status: AC

## 2024-01-08 NOTE — Telephone Encounter (Signed)
 Anusol  can be used upto 2 weeks per recent guidance.

## 2024-01-08 NOTE — Telephone Encounter (Signed)
Left message to return call; also sent my chart message 

## 2024-01-08 NOTE — Telephone Encounter (Signed)
 Pt returned call. Pt is wanting to know should she take another round of Anusol -she was seen on 12/31/23 and prescribed Anusol  for 7 days. Pt asked dont that weaken the muscles(speaking of suppository use). Pt states she has one Anusol  suppository left. Please advise. Thank you

## 2024-01-08 NOTE — Telephone Encounter (Signed)
 Pt contacted and verbalized understanding. I have sent in script for Anusol  to [facility]

## 2024-01-14 ENCOUNTER — Telehealth (INDEPENDENT_AMBULATORY_CARE_PROVIDER_SITE_OTHER): Payer: Self-pay | Admitting: Gastroenterology

## 2024-01-14 NOTE — Telephone Encounter (Signed)
 Pt returned call. Pt is wanting to know if she would be a good candidate for a hemorrhoid banding. OK to leave detailed message. Please advise.

## 2024-01-14 NOTE — Telephone Encounter (Signed)
 Pt left voicemail asking for call back. Returned call to patient but had to [facility]

## 2024-01-20 NOTE — Telephone Encounter (Signed)
 Pt called in and states she was calling to check in about hemorrhoid banding. Sent Dr.Ahmed a secure chat: per Dr.Ahmed- please advice her that if she continues to have blood in stool next step would be colonoscopy to make sure there is no other lesion and assess severity of hemorrhoids. Her last colonoscopy was 2023. after which banding can be recommended   Made pt aware of provider recommendations. Pt stated she hates doing these things. Wanted to know if we have the little pills like Dr.Rehman had and I stated yes. Advised pt to give us  a call when she is ready to schedule.

## 2024-02-19 ENCOUNTER — Telehealth (INDEPENDENT_AMBULATORY_CARE_PROVIDER_SITE_OTHER): Payer: Self-pay | Admitting: Gastroenterology

## 2024-02-19 NOTE — Telephone Encounter (Signed)
 Pt left voicemail requesting call back from nurse. Returned call to patient at [phone] but had to leave voicemail to return call

## 2024-02-20 NOTE — Telephone Encounter (Signed)
 Pt contacted. Pt states she would like to schedule hemorrhoid banding. States she just had colonoscopy in 2023 and is not due for 5 years. Went up front to look at schedule and next appt with APP is 2/26 at 9:15am. Pt states she will take that appointment. Advised pt that [facility] may or may not do a banding. She may just evaluate her. Pt verbalized understanding.

## 2024-03-26 ENCOUNTER — Encounter (INDEPENDENT_AMBULATORY_CARE_PROVIDER_SITE_OTHER): Admitting: Gastroenterology
